# Patient Record
Sex: Male | Born: 1935 | Race: Black or African American | Hispanic: No | Marital: Married | Smoking: Never smoker
Health system: Southern US, Community
[De-identification: ages and names within clinical notes are randomized; demographics above are authoritative.]

## PROBLEM LIST (undated history)

## (undated) DIAGNOSIS — E119 Type 2 diabetes mellitus without complications: Secondary | ICD-10-CM

## (undated) DIAGNOSIS — C801 Malignant (primary) neoplasm, unspecified: Secondary | ICD-10-CM

## (undated) DIAGNOSIS — E1121 Type 2 diabetes mellitus with diabetic nephropathy: Secondary | ICD-10-CM

## (undated) DIAGNOSIS — R310 Gross hematuria: Secondary | ICD-10-CM

## (undated) DIAGNOSIS — K219 Gastro-esophageal reflux disease without esophagitis: Secondary | ICD-10-CM

## (undated) DIAGNOSIS — I129 Hypertensive chronic kidney disease with stage 1 through stage 4 chronic kidney disease, or unspecified chronic kidney disease: Secondary | ICD-10-CM

## (undated) DIAGNOSIS — J449 Chronic obstructive pulmonary disease, unspecified: Secondary | ICD-10-CM

## (undated) DIAGNOSIS — C61 Malignant neoplasm of prostate: Principal | ICD-10-CM

## (undated) DIAGNOSIS — I10 Essential (primary) hypertension: Secondary | ICD-10-CM

## (undated) DIAGNOSIS — N183 Chronic kidney disease, stage 3 unspecified: Secondary | ICD-10-CM

## (undated) DIAGNOSIS — N08 Glomerular disorders in diseases classified elsewhere: Secondary | ICD-10-CM

## (undated) DIAGNOSIS — H409 Unspecified glaucoma: Secondary | ICD-10-CM

## (undated) DIAGNOSIS — N19 Unspecified kidney failure: Secondary | ICD-10-CM

## (undated) DIAGNOSIS — R42 Dizziness and giddiness: Secondary | ICD-10-CM

## (undated) HISTORY — DX: Hypertensive chronic kidney disease with stage 1 through stage 4 chronic kidney disease, or unspecified chronic kidney disease: I12.9

## (undated) HISTORY — DX: Unspecified glaucoma: H40.9

## (undated) HISTORY — DX: Dizziness and giddiness: R42

## (undated) HISTORY — PX: EYE SURGERY: SHX253

## (undated) HISTORY — DX: Unspecified kidney failure: N19

## (undated) HISTORY — DX: Type 2 diabetes mellitus with diabetic nephropathy: E11.21

## (undated) HISTORY — PX: PROSTATE SURGERY: SHX751

## (undated) HISTORY — DX: Malignant neoplasm of prostate: C61

## (undated) HISTORY — DX: Chronic obstructive pulmonary disease, unspecified: J44.9

## (undated) HISTORY — DX: Glomerular disorders in diseases classified elsewhere: N08

## (undated) HISTORY — DX: Gross hematuria: R31.0

## (undated) HISTORY — DX: Type 2 diabetes mellitus without complications: E11.9

## (undated) HISTORY — DX: Chronic kidney disease, stage 3 unspecified: N18.30

## (undated) HISTORY — DX: Gastro-esophageal reflux disease without esophagitis: K21.9

---

## 2004-06-12 ENCOUNTER — Other Ambulatory Visit: Admission: RE | Admit: 2004-06-12 | Discharge: 2004-06-12 | Payer: Self-pay | Admitting: Urology

## 2004-07-19 ENCOUNTER — Inpatient Hospital Stay (HOSPITAL_COMMUNITY): Admission: RE | Admit: 2004-07-19 | Discharge: 2004-07-25 | Payer: Self-pay | Admitting: Urology

## 2005-01-14 ENCOUNTER — Emergency Department (HOSPITAL_COMMUNITY): Admission: EM | Admit: 2005-01-14 | Discharge: 2005-01-14 | Payer: Self-pay | Admitting: Emergency Medicine

## 2008-08-03 ENCOUNTER — Encounter (INDEPENDENT_AMBULATORY_CARE_PROVIDER_SITE_OTHER): Payer: Self-pay | Admitting: *Deleted

## 2008-08-30 ENCOUNTER — Ambulatory Visit: Payer: Self-pay | Admitting: Gastroenterology

## 2008-08-30 ENCOUNTER — Ambulatory Visit (HOSPITAL_COMMUNITY): Admission: RE | Admit: 2008-08-30 | Discharge: 2008-08-30 | Payer: Self-pay | Admitting: Gastroenterology

## 2008-08-30 DIAGNOSIS — R079 Chest pain, unspecified: Secondary | ICD-10-CM | POA: Insufficient documentation

## 2008-08-30 DIAGNOSIS — K3189 Other diseases of stomach and duodenum: Secondary | ICD-10-CM | POA: Insufficient documentation

## 2008-08-30 DIAGNOSIS — R1013 Epigastric pain: Secondary | ICD-10-CM

## 2008-09-01 ENCOUNTER — Encounter: Payer: Self-pay | Admitting: Gastroenterology

## 2008-09-08 ENCOUNTER — Ambulatory Visit: Payer: Self-pay | Admitting: Gastroenterology

## 2008-09-08 ENCOUNTER — Ambulatory Visit (HOSPITAL_COMMUNITY): Admission: RE | Admit: 2008-09-08 | Discharge: 2008-09-08 | Payer: Self-pay | Admitting: Gastroenterology

## 2008-09-08 ENCOUNTER — Encounter: Payer: Self-pay | Admitting: Gastroenterology

## 2008-09-13 ENCOUNTER — Telehealth (INDEPENDENT_AMBULATORY_CARE_PROVIDER_SITE_OTHER): Payer: Self-pay

## 2008-09-29 ENCOUNTER — Encounter: Payer: Self-pay | Admitting: Gastroenterology

## 2008-10-13 ENCOUNTER — Ambulatory Visit (HOSPITAL_COMMUNITY): Admission: RE | Admit: 2008-10-13 | Discharge: 2008-10-13 | Payer: Self-pay | Admitting: Gastroenterology

## 2008-10-13 ENCOUNTER — Encounter: Payer: Self-pay | Admitting: Gastroenterology

## 2008-10-19 ENCOUNTER — Encounter: Payer: Self-pay | Admitting: Gastroenterology

## 2008-10-21 ENCOUNTER — Encounter (INDEPENDENT_AMBULATORY_CARE_PROVIDER_SITE_OTHER): Payer: Self-pay | Admitting: *Deleted

## 2008-10-24 ENCOUNTER — Encounter: Payer: Self-pay | Admitting: Gastroenterology

## 2008-10-25 ENCOUNTER — Encounter (HOSPITAL_COMMUNITY): Admission: RE | Admit: 2008-10-25 | Discharge: 2008-11-24 | Payer: Self-pay | Admitting: Gastroenterology

## 2008-10-28 ENCOUNTER — Ambulatory Visit (HOSPITAL_COMMUNITY): Payer: Self-pay | Admitting: Oncology

## 2008-11-10 ENCOUNTER — Encounter: Payer: Self-pay | Admitting: Gastroenterology

## 2008-11-23 ENCOUNTER — Ambulatory Visit: Payer: Self-pay | Admitting: Gastroenterology

## 2009-03-14 ENCOUNTER — Ambulatory Visit (HOSPITAL_COMMUNITY): Payer: Self-pay | Admitting: Oncology

## 2009-03-14 ENCOUNTER — Encounter (HOSPITAL_COMMUNITY): Admission: RE | Admit: 2009-03-14 | Discharge: 2009-04-13 | Payer: Self-pay | Admitting: Oncology

## 2009-05-18 ENCOUNTER — Encounter: Payer: Self-pay | Admitting: Gastroenterology

## 2010-02-22 ENCOUNTER — Ambulatory Visit (HOSPITAL_COMMUNITY): Admission: RE | Admit: 2010-02-22 | Discharge: 2010-02-22 | Payer: Self-pay | Admitting: Pulmonary Disease

## 2010-03-14 ENCOUNTER — Ambulatory Visit (HOSPITAL_COMMUNITY): Payer: Self-pay | Admitting: Oncology

## 2010-03-14 ENCOUNTER — Encounter (HOSPITAL_COMMUNITY)
Admission: RE | Admit: 2010-03-14 | Discharge: 2010-04-13 | Payer: Self-pay | Source: Home / Self Care | Attending: Oncology | Admitting: Oncology

## 2010-03-29 ENCOUNTER — Encounter: Payer: Self-pay | Admitting: Gastroenterology

## 2010-05-29 NOTE — Letter (Signed)
Summary: [facility]   Imported By: Hoy Morn 03/29/2010 09:39:52  _____________________________________________________________________  External Attachment:    Type:   Image     Comment:   External Document

## 2010-05-29 NOTE — Letter (Signed)
Summary: [facility]   Imported By: Zeb Comfort 05/18/2009 09:29:18  _____________________________________________________________________  External Attachment:    Type:   Image     Comment:   External Document  Appended Document: [facility] NOV 2010-->opv 1 YEAR

## 2010-06-13 ENCOUNTER — Other Ambulatory Visit (HOSPITAL_COMMUNITY): Payer: Self-pay | Admitting: Oncology

## 2010-06-13 ENCOUNTER — Other Ambulatory Visit (HOSPITAL_COMMUNITY): Payer: Medicare Other

## 2010-06-13 ENCOUNTER — Encounter (HOSPITAL_COMMUNITY): Payer: Medicare Other | Attending: Oncology

## 2010-06-13 DIAGNOSIS — I10 Essential (primary) hypertension: Secondary | ICD-10-CM | POA: Insufficient documentation

## 2010-06-13 DIAGNOSIS — R972 Elevated prostate specific antigen [PSA]: Secondary | ICD-10-CM | POA: Insufficient documentation

## 2010-06-13 DIAGNOSIS — M948X9 Other specified disorders of cartilage, unspecified sites: Secondary | ICD-10-CM | POA: Insufficient documentation

## 2010-06-13 DIAGNOSIS — N289 Disorder of kidney and ureter, unspecified: Secondary | ICD-10-CM | POA: Insufficient documentation

## 2010-06-13 DIAGNOSIS — C61 Malignant neoplasm of prostate: Secondary | ICD-10-CM

## 2010-06-13 DIAGNOSIS — Z8546 Personal history of malignant neoplasm of prostate: Secondary | ICD-10-CM | POA: Insufficient documentation

## 2010-06-15 ENCOUNTER — Other Ambulatory Visit (HOSPITAL_COMMUNITY): Payer: Self-pay | Admitting: Oncology

## 2010-06-15 ENCOUNTER — Ambulatory Visit (HOSPITAL_COMMUNITY): Payer: Self-pay | Admitting: *Deleted

## 2010-06-15 ENCOUNTER — Ambulatory Visit (HOSPITAL_COMMUNITY): Payer: Medicare Other | Admitting: Oncology

## 2010-06-15 DIAGNOSIS — C61 Malignant neoplasm of prostate: Secondary | ICD-10-CM

## 2010-06-18 ENCOUNTER — Other Ambulatory Visit (HOSPITAL_COMMUNITY): Payer: Self-pay | Admitting: Oncology

## 2010-06-18 DIAGNOSIS — C61 Malignant neoplasm of prostate: Secondary | ICD-10-CM

## 2010-06-19 ENCOUNTER — Ambulatory Visit (HOSPITAL_COMMUNITY): Payer: Medicare Other

## 2010-06-19 ENCOUNTER — Ambulatory Visit (HOSPITAL_COMMUNITY)
Admission: RE | Admit: 2010-06-19 | Discharge: 2010-06-19 | Disposition: A | Discharge status: Home / Self Care | Payer: Medicare Other | Source: Ambulatory Visit | Attending: Oncology | Admitting: Oncology

## 2010-06-19 ENCOUNTER — Encounter (HOSPITAL_COMMUNITY): Payer: Self-pay

## 2010-06-19 DIAGNOSIS — Z8546 Personal history of malignant neoplasm of prostate: Secondary | ICD-10-CM | POA: Insufficient documentation

## 2010-06-19 DIAGNOSIS — R937 Abnormal findings on diagnostic imaging of other parts of musculoskeletal system: Secondary | ICD-10-CM | POA: Insufficient documentation

## 2010-06-19 DIAGNOSIS — C61 Malignant neoplasm of prostate: Secondary | ICD-10-CM

## 2010-06-19 DIAGNOSIS — Z9079 Acquired absence of other genital organ(s): Secondary | ICD-10-CM | POA: Insufficient documentation

## 2010-06-19 DIAGNOSIS — Q619 Cystic kidney disease, unspecified: Secondary | ICD-10-CM | POA: Insufficient documentation

## 2010-06-19 HISTORY — DX: Essential (primary) hypertension: I10

## 2010-06-19 HISTORY — DX: Malignant (primary) neoplasm, unspecified: C80.1

## 2010-06-19 MED ORDER — TECHNETIUM TC 99M MEDRONATE IV KIT
25.0000 | PACK | Freq: Once | INTRAVENOUS | Status: AC | PRN
  Administered 2010-06-19: 25.3 via INTRAVENOUS

## 2010-06-26 ENCOUNTER — Ambulatory Visit (HOSPITAL_COMMUNITY): Payer: Medicare Other | Admitting: Oncology

## 2010-06-26 DIAGNOSIS — C61 Malignant neoplasm of prostate: Secondary | ICD-10-CM

## 2010-06-29 LAB — DIFFERENTIAL
Basophils Relative: 1 % (ref 0–1)
Lymphs Abs: 1.6 10*3/uL (ref 0.7–4.0)
Monocytes Relative: 8 % (ref 3–12)
Neutro Abs: 3.3 10*3/uL (ref 1.7–7.7)
Neutrophils Relative %: 60 % (ref 43–77)

## 2010-06-29 LAB — COMPREHENSIVE METABOLIC PANEL
BUN: 20 mg/dL (ref 6–23)
Calcium: 9.3 mg/dL (ref 8.4–10.5)
Glucose, Bld: 139 mg/dL — ABNORMAL HIGH (ref 70–99)
Total Protein: 6.9 g/dL (ref 6.0–8.3)

## 2010-06-29 LAB — CBC
HCT: 35.7 % — ABNORMAL LOW (ref 39.0–52.0)
MCHC: 35.3 g/dL (ref 30.0–36.0)
MCV: 77.4 fL — ABNORMAL LOW (ref 78.0–100.0)
RDW: 13 % (ref 11.5–15.5)

## 2010-07-03 ENCOUNTER — Ambulatory Visit: Payer: Medicare Other | Attending: Radiation Oncology | Admitting: Radiation Oncology

## 2010-07-03 DIAGNOSIS — Z510 Encounter for antineoplastic radiation therapy: Secondary | ICD-10-CM | POA: Insufficient documentation

## 2010-07-03 DIAGNOSIS — C61 Malignant neoplasm of prostate: Secondary | ICD-10-CM | POA: Insufficient documentation

## 2010-07-10 LAB — PSA: PSA: 0.32 ng/mL (ref <=4.00)

## 2010-08-01 LAB — PROTEIN ELECTROPH W RFLX QUANT IMMUNOGLOBULINS
Beta 2: 4.8 % (ref 3.2–6.5)
Beta Globulin: 4.9 % (ref 4.7–7.2)
Gamma Globulin: 18.2 % (ref 11.1–18.8)
M-Spike, %: NOT DETECTED g/dL

## 2010-08-01 LAB — DIFFERENTIAL
Basophils Absolute: 0 10*3/uL (ref 0.0–0.1)
Basophils Relative: 1 % (ref 0–1)
Eosinophils Absolute: 0 10*3/uL (ref 0.0–0.7)
Eosinophils Relative: 1 % (ref 0–5)
Monocytes Absolute: 0.2 10*3/uL (ref 0.1–1.0)
Neutro Abs: 3.1 10*3/uL (ref 1.7–7.7)

## 2010-08-01 LAB — COMPREHENSIVE METABOLIC PANEL
ALT: 53 U/L (ref 0–53)
AST: 39 U/L — ABNORMAL HIGH (ref 0–37)
Albumin: 4.2 g/dL (ref 3.5–5.2)
Alkaline Phosphatase: 49 U/L (ref 39–117)
BUN: 13 mg/dL (ref 6–23)
Chloride: 101 mEq/L (ref 96–112)
Potassium: 3.9 mEq/L (ref 3.5–5.1)
Total Bilirubin: 0.8 mg/dL (ref 0.3–1.2)

## 2010-08-01 LAB — CBC
HCT: 39 % (ref 39.0–52.0)
Platelets: 186 10*3/uL (ref 150–400)
WBC: 4.6 10*3/uL (ref 4.0–10.5)

## 2010-08-05 LAB — CBC
Hemoglobin: 13.6 g/dL (ref 13.0–17.0)
MCHC: 36.3 g/dL — ABNORMAL HIGH (ref 30.0–36.0)
MCV: 81.9 fL (ref 78.0–100.0)
RBC: 4.58 MIL/uL (ref 4.22–5.81)
WBC: 6.1 10*3/uL (ref 4.0–10.5)

## 2010-08-05 LAB — PSA: PSA: 0.14 ng/mL (ref 0.10–4.00)

## 2010-09-11 NOTE — Op Note (Signed)
NAME:  Richard Swanson, Richard Swanson            ACCOUNT NO.:  [id number]   MEDICAL RECORD NO.:  [id number]          PATIENT TYPE:  AMB   LOCATION:  DAY                           FACILITY:  [facility]   PHYSICIAN:  Caro Hight, M.D.      DATE OF BIRTH:  23-Nov-1934   DATE OF PROCEDURE:  09/08/2008  DATE OF DISCHARGE:                               OPERATIVE REPORT   REFERRING Laurena Valko:  Jasper Loser. Luan Pulling, MD   PROCEDURE:  Esophagogastroduodenoscopy with cold forceps biopsy.   INDICATION FOR EXAM:  Mr. Schey is a 76 year old male whose brother  had a gastric GIST.  He presents with new-onset dyspepsia.   FINDINGS:  1. Normal esophagus without evidence of Barrett mass, erosion,      ulceration, or strictures.  2. Patchy erythema with occasional erosion in the antrum.  Patchy      erythema in the body of the stomach.  Biopsies obtained via cold      forceps to evaluate for H. pylori gastritis.  3. Normal duodenal bulb and second portion of the duodenum.   DIAGNOSIS:  Mild gastritis.   RECOMMENDATIONS:  1. We will call Mr. Carrara with results of his biopsies.  2. He was given a handout on gastritis.  3. No aspirin, NSAIDs, or anticoagulation for 7 days.  4. He may resume his previous diet.  If no source for his symptoms are      identified, then he will need a CT scan of the abdomen and pelvis.   MEDICATIONS:  1. Demerol 50 mg IV.  2. Versed 2 mg IV.   PROCEDURE TECHNIQUE:  Physical exam was performed.  Informed consent was  obtained from the patient explaining benefits, risks, and alternatives  to procedure.  The patient was connected to the monitor and placed in  left lateral position.  Continuous oxygen was provided by nasal cannula  and IV medicine was administered through an indwelling cannula.  After  administration of sedation, the patient's esophagus was intubated.  The  scope was advanced under direct visualization to the second portion of  the duodenum.  The scope was removed slowly  by  carefully examining the color, texture, anatomy, and integrity mucosa on  the way out.  The patient was recovered in endoscopy and discharged home  in satisfactory condition.   PATH:  Mild gastritis      Caro Hight, M.D.  Electronically Signed     SM/MEDQ  D:  09/08/2008  T:  09/08/2008  Job:  [id number]   cc:   Percell Miller L. Luan Pulling, M.D.  Fax: [phone]

## 2010-09-12 ENCOUNTER — Other Ambulatory Visit (HOSPITAL_COMMUNITY): Payer: Medicare Other

## 2010-09-14 ENCOUNTER — Ambulatory Visit (HOSPITAL_COMMUNITY): Payer: Medicare Other | Admitting: Oncology

## 2010-09-14 NOTE — Group Therapy Note (Signed)
NAME:  Richard Swanson, Richard Swanson            ACCOUNT NO.:  [id number]   MEDICAL RECORD NO.:  [id number]          PATIENT TYPE:  INP   LOCATION:  IC10                          FACILITY:  [facility]   PHYSICIAN:  Edward L. Luan Pulling, M.D.DATE OF BIRTH:  Mar 16, 1935   DATE OF PROCEDURE:  07/20/2004  DATE OF DISCHARGE:                                   PROGRESS NOTE   PROBLEM:  Status post radical prostatectomy and lymph node dissection.   SUBJECTIVE:  Richard Swanson says he feels well and has no complaints.  He does  have some pain postoperatively which is to be expected of course.  He has  had no problems through the night.   PHYSICAL EXAMINATION:  GENERAL:  He is awake and alert.  CHEST:  Very clear.  HEART:  Regular.  VITAL SIGNS:  Blood pressure is about 120, pulse is around 80.  ABDOMEN:  Not examined.   LABORATORY DATA:  White count 14,100, hemoglobin 12.3, and his BME shows a  sodium of 132, potassium of 3.6, glucose 137, BUN of 8.   ASSESSMENT:  He is doing well.   PLAN:  Continue treatments and follow.      ELH/MEDQ  D:  07/20/2004  T:  07/20/2004  Job:  [id number]

## 2010-09-14 NOTE — Op Note (Signed)
NAME:  AARAV, GUIDA            ACCOUNT NO.:  [id number]   MEDICAL RECORD NO.:  [id number]          PATIENT TYPE:  AMB   LOCATION:  DAY                           FACILITY:  [facility]   PHYSICIAN:  Marissa Nestle, M.D.DATE OF BIRTH:  1934/10/10   DATE OF PROCEDURE:  07/19/2004  DATE OF DISCHARGE:                                 OPERATIVE REPORT   PREOPERATIVE DIAGNOSIS:  Carcinoma of prostate.   POSTOPERATIVE DIAGNOSIS:  Carcinoma of prostate.   PROCEDURE:  Bilateral pelvic node dissection, radical retropubic  prostatectomy.   ANESTHESIA:  General endotracheal.   SURGEON:  Marissa Nestle, M.D.   ESTIMATED BLOOD LOSS:  1200 mL.   BLOOD REPLACED:  Two units of packed cells.   COMPLICATIONS:  None.   COUNTS:  Instrument, needle, sponge count correct.   PROCEDURE:  The patient was given general endotracheal anesthesia, placed in  low lithotomy position.  A midline suprapubic incision about three inches  long is made, carried down through the subcutaneous tissue, rectus sheath is  incised, recti separated in the midline, retropubic space was entered.  The  dissection is done in the paravesical areas to expose the right and left  external iliac vein.  Self-retaining Bookwalter retractor is applied, so I  proceed to do the node dissection on the right side.  Fascia around the  external iliac vein on the left side is opened.  Obturator nerve and vessels  are readily identified.  Between these vessels and external iliac vein, the  fatty tissue covering the Scarpa's fascia was removed.  The lymphatics were  clipped, specimen sent for frozen section.  Similarly, specimen from the  right side is taken and the report came back that there is no metastatic  disease in the lymph nodes on both sides, so I proceeded to do a radical  prostatectomy.  The fat covering the anterior surface of the prostate is  cleared.  The puboprostatic ligaments, some of them which were visible, they  were divided close to the prostate and the endopelvic fascia is opened near  the apex of the prostate, and the opening in the fascia was carried toward  the superior part of the prostate alongside the lateral side of the prostate  on both sides, and the neurovascular bundles were then pushed away from the  prostate, clipping the small branches going into the prostate.  The apex of  the prostate is exposed.  The dorsal vein complex was ligated with a couple  of stitches of 0 Vicryl and then one stitch was placed on the anterior  surface of the prostate.  In between these two stitches, the dorsal vein  complex is divided, the urethra is exposed.  The anterior wall of the  urethra is divided.  The Foley catheter pulled out with a Vanderbilt clamp,  the proximal end was clamped, and catheter divided.  Slight traction on the  catheter, the posterior wall of the urethra was divided.  The neurovascular  bundles are pushed away from the apex of the prostate.  The prostate was  further lifted up and Denonvilliers' fascia was opened.  Seminal vesicles  were exposed.  This side of the seminal vesicle, they were completely freed.  I proceeded to do the dissection on the bladder neck.  It was opened  anteriorly in the midline.  The catheter was pulled out from the bladder and  held both ends together for traction on the prostate.  The ureteral orifices  were identified.  Whistle-tip #5 catheters were passed up into the renal  pelvis on both sides, then catheters stabilized to the bladder with 4-0  chromic stitch.  Posterior bladder neck is then divided under vision.  The  prostate is separated from the bladder.  From this side the vasa deferentia  were identified.  They were divided and clipped.  The remaining part of the  sheath covering the seminal vesicle was divided.  The seminal vesicles were  pulled out of their sheath, clipping the seminal vesicle arteries.  The  remaining attachments were  divided.  Specimen came out.  Operative site was  completely inspected.  The bleeders were ligated, closed with 3-0 Vicryl  stitch.  The patient lost about 1200 mL of blood, replaced with 2 units.  Operative site was thoroughly irrigated.  The bladder neck is closed with 2-  0 Vicryl stitch on each side, and the bladder neck was then epithelialized  by creating a stitch of 4-0 chromic, everting the mucosa covering the  bladder neck.  The stitch on the side of the bladder neck on each side was  left to be tied later with the urethral stitch.  Operative site was  thoroughly inspected, no bleeding.  All the laps were removed.  A #20 Foley  catheter inserted into urethra and the Foley catheter was held with a free  tie.  Six stitches between the urethral stump and the bladder neck were  placed using 3-0 Vicryl.  Then the Foley catheter was placed in the bladder,  balloon inflated to 40 mL, held on traction by moving the superior blade of  the retractor.  The bladder was pushed into the retropubic space to  approximate the urethral stump and the bladder neck.  All the stitches were  closed.  The superior stitch on each side was also closed with dorsal vein  complex stitches.  The retropubic space was drained with a __________ sump.  The ureteral catheter came out through a separate stab wound in the bladder  that came out along the __________ sump, stabilized to the skin with 0 silk  stitch.  The bladder is irrigated, no leak from the anastomosis.  Foley  catheter was left on traction.  The rectus sheath closed with continuous  stitch of 0 Vicryl, skin was closed with staples, sterile gauze dressing  applied.  The patient left the operating room in satisfactory condition.      MIJ/MEDQ  D:  07/19/2004  T:  07/19/2004  Job:  [id number]

## 2010-09-14 NOTE — Group Therapy Note (Signed)
NAME:  Richard Swanson, Richard Swanson            ACCOUNT NO.:  [id number]   MEDICAL RECORD NO.:  [id number]          PATIENT TYPE:  INP   LOCATION:  IC10                          FACILITY:  [facility]   PHYSICIAN:  Edward L. Luan Pulling, M.D.DATE OF BIRTH:  [birth year]   DATE OF PROCEDURE:  DATE OF DISCHARGE:                                   PROGRESS NOTE   PROBLEM:  1.  Status post prostatectomy and lymph node dissection.  2.  Hypertension.   SUBJECTIVE:  Richard Swanson says he feels great and has no complaints.  He has  had some of the drains removed and apparently is set to move from the  intensive care unit today.  He was able to sleep last night with the use of  Ativan.   PHYSICAL EXAMINATION:  CHEST:  Clear.  HEART:  Regular.  ABDOMEN:  Soft.  VITAL SIGNS:  Blood pressure 120/60, pulse is 80.  He does have some fever,  but his chest is very clear and his heart is regular.   ASSESSMENT:  He is much improved.   PLAN:  Apparently to move him from the intensive care unit.  Certainly, I  agree with that plan and would follow.      ELH/MEDQ  D:  07/22/2004  T:  07/22/2004  Job:  [id number]

## 2010-09-14 NOTE — Discharge Summary (Signed)
NAME:  Richard Swanson, Richard Swanson            ACCOUNT NO.:  [id number]   MEDICAL RECORD NO.:  [id number]          PATIENT TYPE:  INP   LOCATION:  A207                          FACILITY:  [facility]   PHYSICIAN:  Marissa Nestle, M.D.DATE OF BIRTH:  1934/08/16   DATE OF ADMISSION:  07/19/2004  DATE OF DISCHARGE:  03/29/2006LH                                 DISCHARGE SUMMARY   HISTORY OF PRESENT ILLNESS:  This 76 year old gentleman has been followed by  me since December.  She PSA is 6.73, and because of his age, we decided to  do a prostate biopsy, and it showed that he has adenocarcinoma of the  prostate with a Gleason score of 3.  Three biopsies out of 12 were positive.  We discussed various treatment options.  He elected to undergo a radical  prostatectomy.   HOSPITAL COURSE:  Preoperative work-up was done.  CBC, urinalysis, EKG, and  chest x-ray were normal.  He was taken to the operating room, and underwent  bilateral pelvic node dissection and radical retropubic prostatectomy.   The first postoperative day, his temperature was 100.1.  He was encouraged  to do his incentive spirometry.  CBC count is 14.1, hematocrit 34.5.  Stump  is dry.  Abdomen was soft and nondistended.  He was placed out of bed.  His  glucose was 137, BUN 8, creatinine 1.3.   On July 22, 2004, urine status was good.  Temperature was 101.3.  Urine  output was satisfactory, abdomen was soft, good bowel sounds no flatus.  Chest x-ray was negative.  CBC was normal.  I discontinued his Ancef and put  him on Cipro 400 mg IV b.i.d.   The next day he was afebrile, doing well.  He had a bowel movement.  Intake  and output are satisfactory.  Urine is pinkish.  Discontinued his IV and put  him a regular diet.   On July 24, 2004, he was afebrile and felt fine.  Hematocrit has dropped to  23.2.  Stool guaiac was done, which was negative.  He was given a couple of  units of blood.  At this point, he was up and walking around,  eating regular  food.  Hematocrit had come up to 31.7, WBC count 8000.  Sodium 133,  potassium, chloride 100, CO2 of 30, BUN 8, creatinine 1.4.  His final  pathology report is back.  His DNM code is [place].   At this point I am going to discharge him home.  Will take the stitches out  next week in the office.   FINAL DISCHARGE DIAGNOSIS:  Carcinoma of the prostate.   DISCHARGE CONDITION:  Improved.   DISCHARGE MEDICATIONS:  1.  Percocet.  2.  Cipro 250 mg one b.i.d. for 5 days.  3.  Detrol LA one p.o. daily, #7.   FOLLOW UP:  I will see him back in the office next week for removal of the  stitches.  He is going home with a Foley catheter.      MIJ/MEDQ  D:  07/29/2004  T:  07/30/2004  Job:  [id number]

## 2010-09-14 NOTE — Consult Note (Signed)
NAME:  Richard, Swanson            ACCOUNT NO.:  [id number]   MEDICAL RECORD NO.:  [id number]          PATIENT TYPE:  INP   LOCATION:  IC10                          FACILITY:  [facility]   PHYSICIAN:  Edward L. Luan Pulling, M.D.DATE OF BIRTH:  October 23, 1934   DATE OF CONSULTATION:  07/19/2004  DATE OF DISCHARGE:                                   CONSULTATION   REASON FOR CONSULTATION:  Medical management postop.   HISTORY:  Richard Swanson is a 76 year old whom I have seen for many years  with hypertension.  He had had blood work done, which showed that he had an  elevated PSA.  He was referred to Dr. Michela Pitcher and who had a prostate biopsy  which showed an adenocarcinoma of the prostate.  He has undergone a radical  prostatectomy since then.   PAST MEDICAL HISTORY:  Positive for hypertension and essentially no other  medical problems.   FAMILY HISTORY:  Negative for prostate cancer.  It is positive for  hypertension.   SOCIAL HISTORY:  He is a Company secretary.  He does not smoke.  He does not drink  any alcohol.  He had also worked at a [employer].   REVIEW OF SYSTEMS:  Essentially otherwise negative.   PHYSICAL EXAMINATION:  GENERAL:  Physical exam now shows that he is postop  in the intensive care unit.  VITAL SIGNS:  His blood pressure now 105/71, pulse is about 80.  CHEST:  Very clear.  CARDIAC:  His heart is regular.  ABDOMEN:  Not examined, he has just had surgery.  EXTREMITIES:  He does not have any edema of the extremities.  HEENT:  Mucous membranes are moist.  Pupils are reactive.   ASSESSMENT:  He seems to have done very well and postoperatively is quite  stable.  I will plan to follow him with you.  At this point, of course, he  does not need any treatment for his blood pressure, but he will again.  Thus  far, things have gone well.      ELH/MEDQ  D:  07/19/2004  T:  07/20/2004  Job:  [id number]

## 2010-09-14 NOTE — Group Therapy Note (Signed)
NAME:  Richard Swanson, Richard Swanson            ACCOUNT NO.:  [id number]   MEDICAL RECORD NO.:  [id number]          PATIENT TYPE:  INP   LOCATION:  IC10                          FACILITY:  [facility]   PHYSICIAN:  Edward L. Luan Pulling, M.D.DATE OF BIRTH:  Oct 19, 1934   DATE OF PROCEDURE:  DATE OF DISCHARGE:                                   PROGRESS NOTE   Mr. Alkins says he is feeling well and has no complaints.  He has been  sitting up in a chair.  His exam shows that his chest is clear.  O2  saturation is 95% on room air.  Blood pressure is about 130/80, pulse is 80,  heart is regular.   ASSESSMENT:  He is improved.   PLAN:  Plan is to continue his treatments and follow.      ELH/MEDQ  D:  07/23/2004  T:  07/23/2004  Job:  [id number]

## 2010-09-14 NOTE — Group Therapy Note (Signed)
NAME:  Richard Swanson, Richard Swanson            ACCOUNT NO.:  [id number]   MEDICAL RECORD NO.:  [id number]          PATIENT TYPE:  INP   LOCATION:  IC10                          FACILITY:  [facility]   PHYSICIAN:  Edward L. Luan Pulling, M.D.DATE OF BIRTH:  09/23/1934   DATE OF PROCEDURE:  DATE OF DISCHARGE:                                   PROGRESS NOTE   SUBJECTIVE:  Richard Swanson says he is feeling well, and he has no particular  complaints except he cannot sleep.   OBJECTIVE:  VITAL SIGNS:  Temperature is 100, blood pressure 140/74, pulse  80.  CHEST:  Very clear.  HEART:  Regular.  ABDOMEN:  Soft.  He has a small amount of bowel sounds.   His white blood count is 13,400, hemoglobin 11.6, platelets 128,000.   ASSESSMENT:  Overall, things seem to be going well.   PLAN:  I am going to go ahead and given him some Ativan sublingually at  night to see if it will help him sleep.  He will probably have to start his  blood pressure medication back tomorrow.      ELH/MEDQ  D:  07/21/2004  T:  07/21/2004  Job:  [id number]

## 2010-09-14 NOTE — Consult Note (Signed)
NAME:  RENDALL, FUCILE NO.:  [id number]   MEDICAL RECORD NO.:  [id number]          PATIENT TYPE:  EMS   LOCATION:  ED                            FACILITY:  [facility]   PHYSICIAN:  J. Sanjuana Kava, M.D. DATE OF BIRTH:  04-Feb-1935   DATE OF CONSULTATION:  01/14/2005  DATE OF DISCHARGE:                                   CONSULTATION   CHIEF COMPLAINT:  I fell off a ladder and hit my arm.   HISTORY OF PRESENT ILLNESS:  The patient is a 76 year old male who fell off  the ladder and injured his left humerus.  Got a fracture mid shaft of the  humerus, essentially nondisplaced.  A closed injury.  No other injuries  occurred.  No head injury.  He has pain and tenderness.  Neurovascularly he  is intact.  Fractured mid shaft diaphysis left humerus.   PLAN:  I put him in a co-abduction splint.  Explained to him about using a  sling, sleeping semierect.  Prescription of Vicodin was given for pain.  I  will see him in the office tomorrow afternoon.  They should put him in a  fracture brace.  Any difficulty or problem at all, let me know.  Radial  nerve function is intact.           ______________________________  J. Sanjuana Kava, M.D.     JWK/MEDQ  D:  01/14/2005  T:  01/14/2005  Job:  [id number]

## 2010-09-14 NOTE — Group Therapy Note (Signed)
NAME:  Richard Swanson, Richard Swanson            ACCOUNT NO.:  [id number]   MEDICAL RECORD NO.:  [id number]          PATIENT TYPE:  INP   LOCATION:  A207                          FACILITY:  [facility]   PHYSICIAN:  Edward L. Luan Pulling, M.D.DATE OF BIRTH:  [birth year]   DATE OF PROCEDURE:  07/24/2004  DATE OF DISCHARGE:                                   PROGRESS NOTE   Patient of Dr. Silvano Swanson.   Richard Swanson is doing well and says he is going home today.  He has no new  complaints.   PHYSICAL EXAMINATION:  CHEST:  Quite clear.  VITAL SIGNS:  Temperature 99.1, pulse 87, respirations 18, blood pressure  154/80.   ASSESSMENT:  He seems to be doing better.   PLAN:  I think it is okay for him to go home and I have asked for him to  start back on his home blood pressure medication.      ELH/MEDQ  D:  07/24/2004  T:  07/24/2004  Job:  [id number]

## 2010-09-14 NOTE — H&P (Signed)
NAME:  Richard Swanson, Richard Swanson            ACCOUNT NO.:  [id number]   MEDICAL RECORD NO.:  [id number]          PATIENT TYPE:  AMB   LOCATION:  DAY                           FACILITY:  [facility]   PHYSICIAN:  Marissa Nestle, M.D.DATE OF BIRTH:  05/07/1934   DATE OF ADMISSION:  07/19/2004  DATE OF DISCHARGE:  LH                                HISTORY & PHYSICAL   CHIEF COMPLAINT:  Prostate cancer.   HISTORY:  This 76 year old gentleman was seen by me on April 09, 2004,  after he was referred by Dr. Luan Pulling.  His PSA was 6.73.  He has no symptoms  of prostatism.  I told him to repeat his PSA with a free PSA and the next  week his PSA was 5.92.  It dropped a little bit, but his free PSA was low at  14%.  Because of his age, I told him that he needs to have a prostate  biopsy, which was done and it showed that he has adenocarcinoma of the  prostate.  His Gleason score is 1 + 2 3.  Three biopsies out of 12 are  positive.  I discussed the treatment options, including radical  prostatectomy and radiotherapy and he elected to go ahead and have radical  prostatectomy.  That is what I recommended.  I had a long discussion with  the patient and his wife.  I told them the complications of radical  prostatectomy.  Especially since he has no other significant medical  problems, he should do well.  His complications including:  1.  Urinary  incontinence, which can be permanent.  2.  Erectile dysfunction, which will  be permanent.  3.  Use of blood.  They understand and want me to go ahead  and proceed with the surgery, so he is coming in as an outpatient and will  undergo radical retropubic prostatectomy and bilateral pelvic node  dissection tomorrow.   PAST MEDICAL HISTORY:  He has hypertension.  No diabetes.  Never had any  other surgery.   FAMILY HISTORY:  No history of prostate cancer.   PERSONAL HISTORY:  Does not smoke or drink.   REVIEW OF SYSTEMS:  Denies any chest pain, orthopnea, PND,  nausea or  vomiting.   PHYSICAL EXAMINATION:  VITAL SIGNS:  The blood pressure is 140/80 and the  temperature is normal.  CENTRAL NERVOUS SYSTEM:  He has no gross neurological deficits.  HEENT:  Negative.  NECK:  Supple.  No lymphadenopathy.  No bruit.  CHEST:  Normal breath sounds.  HEART:  Regular sinus rhythm.  ABDOMEN:  Soft and flat.  Liver, spleen and kidneys are not palpable.  No  CVA tenderness.  EXTERNAL GENITALIA:  Circumcised meatus, adequate.  The testicles are  normal.  RECTAL:  No rectal mass.  The prostate is 1+, smooth and firm.   IMPRESSION:  1.  Carcinoma of the prostate.  2.  Hypertension.   PLAN:  Radical retropubic prostatectomy and bilateral pelvic node  dissection.      MIJ/MEDQ  D:  07/18/2004  T:  07/18/2004  Job:  [id number]   cc:  [facility]  Fax: [place] Luan Pulling, M.D.  [place]  [place] [address]  Fax: [phone]

## 2010-10-17 ENCOUNTER — Ambulatory Visit
Admission: RE | Admit: 2010-10-17 | Discharge: 2010-10-17 | Disposition: A | Discharge status: Home / Self Care | Payer: Medicare Other | Source: Ambulatory Visit | Attending: Radiation Oncology | Admitting: Radiation Oncology

## 2010-11-02 ENCOUNTER — Other Ambulatory Visit (HOSPITAL_COMMUNITY): Payer: Self-pay | Admitting: Oncology

## 2010-11-02 ENCOUNTER — Encounter (HOSPITAL_COMMUNITY): Payer: Medicare Other | Attending: Oncology

## 2010-11-02 DIAGNOSIS — C61 Malignant neoplasm of prostate: Secondary | ICD-10-CM | POA: Insufficient documentation

## 2010-11-03 ENCOUNTER — Encounter (HOSPITAL_COMMUNITY): Payer: Self-pay | Admitting: Oncology

## 2010-11-03 ENCOUNTER — Encounter (HOSPITAL_COMMUNITY): Payer: Self-pay | Admitting: *Deleted

## 2010-11-03 ENCOUNTER — Other Ambulatory Visit (HOSPITAL_COMMUNITY): Payer: Self-pay | Admitting: Oncology

## 2010-11-03 DIAGNOSIS — C61 Malignant neoplasm of prostate: Secondary | ICD-10-CM

## 2010-11-03 HISTORY — DX: Malignant neoplasm of prostate: C61

## 2010-11-03 LAB — PSA: PSA: 0.09 ng/mL — ABNORMAL LOW (ref <=4.00)

## 2010-11-05 ENCOUNTER — Encounter (HOSPITAL_BASED_OUTPATIENT_CLINIC_OR_DEPARTMENT_OTHER): Payer: Medicare Other | Admitting: Oncology

## 2010-11-05 VITALS — BP 130/70 | HR 65 | Temp 97.7°F | Wt 197.8 lb

## 2010-11-05 DIAGNOSIS — C61 Malignant neoplasm of prostate: Secondary | ICD-10-CM

## 2010-11-05 NOTE — Progress Notes (Signed)
Richard Swanson NOTE  11/05/2010      DIAGNOSIS: History of a Gleason's score 5 prostate cancer, s/p radiation therapy by Dr. Valere Dross on 08/01/10- 09/14/10 and s/p radical prostatectomy in March 2006 by Dr. Michela Pitcher.   CURRENT THERAPY:  Following PSA via laboratory studies.   HPI: Richard Swanson is here today for follow-up of his Prostate cancer.  He recently completed curative radiation  To the prostate bed.  He underwent radiation from 08/01/10- 09/14/10.  According to Dr. Charlton Amor note, the patient complained of dysuria.  He tells me that has resolved.  He denies any urinary pain, burning, hematuria.  I will defer a U/A at this point in time.  The patient's most recent PSA was 0.09 compared to 0.37 on 06/13/10.  He is pleased with that result.  Again, he denies any complaint at this office visit.   REVIEW OF SYSTEMS:  Performance Status:Normal - ECOG 0   Constitutional: Normal - No Fever, chills, night sweats, or weight loss  Eyes: Normal - No change in visual acuity, blurred or double vision  Ears, Nose, Sinuses:Normal - No epistaxis, facial pain, nasal discharge or change in hearing  Mouth, Pharynx: Normal - No pain, ulceration, or sores noted  Cardiovascular:Normal - No chest pain, DOE, or palpitations  Respiratory: Normal - No shortness of breath, cough, hemoptysis, or pleuritic chest pain  Gastrointestinal:Normal - No abdominal pain, nausea, vomiting, diarrhea, rectal pain or bleeding  Genitourinary: Normal - Denies Hematuria or dysuria  Musculoskeletal: Normal - No bone pain  Skin: Normal - No skin rash or lesions noted  Neurologic: Normal - No numbness, weakness, neuropathic pain or change in cognitive function  Hematologic: Normal - No bleeding or lymph nodes noted   PHYSICAL EXAMINATION:  General Appearance: Normal - Healthy appearing patient in no acute distress  Skin: Normal - No skin rash, petechiae, ecchymosis noted  HEENT: Normal -  No oral or pharyngeal masses, ulceration or thrush noted, no sinus tenderness  Neck: Normal - Supple  Lungs/Thorax: Normal - Clear to auscultation and percussion  Heart: Normal - Regular rate and rhythm, normal S1, S2, no appreciable murmurs, rubs, gallops  Pulses: Normal - 2+ throughout and symmetrical  Abdomen:Normal - Soft, nontender, bowel sounds present, no appreciable hepatosplenomegaly, no palpable masses  Neurologic: Normal - Grossly intact   LABORATORY DATA: PSA on 11/02/10 0.09    ASSESSMENT: This is an African American 76 year old man with prostate cancer.  He is s/p prostatectomy in March 2006 and now most recently radiation by Dr. Valere Dross on 08/01/10- 09/14/10.  His PSA has dropped accordingly.   PLAN:  We will continue to follow the patient regularly.  We will obtain a CBC diff, CMET, and PSA in three months time.  The patient will return to the clinic for follow-up in three months as well.     All questions were answered. The patient knows to call the clinic with any problems, questions or concerns. We can certainly see the patient much sooner if necessary.  The patient and plan discussed with Everardo All, MD and he is in agreement with the aforementioned.  I spent 25 minutes counseling the patient face to face. The total time spent in the appointment was 40 minutes.  Sammuel Blick

## 2011-01-07 ENCOUNTER — Other Ambulatory Visit (HOSPITAL_COMMUNITY): Payer: Medicare Other

## 2011-01-11 ENCOUNTER — Encounter (HOSPITAL_COMMUNITY): Payer: Medicare Other | Attending: Oncology

## 2011-01-11 DIAGNOSIS — C61 Malignant neoplasm of prostate: Secondary | ICD-10-CM | POA: Insufficient documentation

## 2011-01-11 LAB — COMPREHENSIVE METABOLIC PANEL
ALT: 22 U/L (ref 0–53)
AST: 21 U/L (ref 0–37)
Albumin: 3.9 g/dL (ref 3.5–5.2)
Alkaline Phosphatase: 65 U/L (ref 39–117)
Potassium: 3.9 mEq/L (ref 3.5–5.1)
Sodium: 138 mEq/L (ref 135–145)
Total Protein: 8 g/dL (ref 6.0–8.3)

## 2011-01-11 LAB — DIFFERENTIAL
Basophils Absolute: 0 10*3/uL (ref 0.0–0.1)
Basophils Relative: 0 % (ref 0–1)
Eosinophils Absolute: 0.1 10*3/uL (ref 0.0–0.7)
Neutrophils Relative %: 76 % (ref 43–77)

## 2011-01-11 LAB — CBC
MCH: 27.5 pg (ref 26.0–34.0)
MCHC: 34.7 g/dL (ref 30.0–36.0)
Platelets: 154 10*3/uL (ref 150–400)

## 2011-01-11 LAB — PSA: PSA: 0.04 ng/mL — ABNORMAL LOW (ref <=4.00)

## 2011-01-11 NOTE — Progress Notes (Signed)
Labs drawn today for cbc/diff,cmp,psa

## 2011-02-11 ENCOUNTER — Encounter (HOSPITAL_COMMUNITY): Payer: Medicare Other | Attending: Oncology | Admitting: Oncology

## 2011-02-11 ENCOUNTER — Encounter (HOSPITAL_COMMUNITY): Payer: Medicare Other

## 2011-02-11 VITALS — BP 159/91 | HR 69 | Temp 98.0°F | Ht 72.0 in | Wt 196.0 lb

## 2011-02-11 DIAGNOSIS — C61 Malignant neoplasm of prostate: Secondary | ICD-10-CM

## 2011-02-11 DIAGNOSIS — N529 Male erectile dysfunction, unspecified: Secondary | ICD-10-CM

## 2011-02-11 NOTE — Patient Instructions (Signed)
[facility]  Discharge Instructions  RECOMMENDATIONS MADE BY THE CONSULTANT AND ANY TEST RESULTS WILL BE SENT TO YOUR REFERRING DOCTOR.   EXAM FINDINGS BY MD TODAY AND SIGNS AND SYMPTOMS TO REPORT TO CLINIC OR PRIMARY MD: Exam per [place] AND DISCUSSED: Other  Labs in 3 months  SPECIAL INSTRUCTIONS/FOLLOW-UP: Return to Clinic  In 3 months    I acknowledge that I have been informed and understand all the instructions given to me and received a copy. I do not have any more questions at this time, but understand that I may call the Specialty Clinic at [facility] at [phone] during business hours should I have any further questions or need assistance in obtaining follow-up care.    __________________________________________  _____________  __________ Signature of Patient or Authorized Representative            Date                   Time    __________________________________________ Nurse's Signature

## 2011-02-11 NOTE — Progress Notes (Addendum)
Richard Bogus, MD [address] [place] [address]  1. Prostate cancer  CBC, Differential, Comprehensive metabolic panel, PSA    CURRENT THERAPY:S/p radiation therapy by Dr. Valere Dross on 08/01/10- 09/14/10 and s/p radical prostatectomy in March 2006 by Dr. Michela Pitcher.   INTERVAL HISTORY: Richard Swanson 76 y.o. male returns for  regular  visit for followup of  History of a Gleason's score 5 prostate cancer.   The patient only complaint is morning is his sexual life. He reports that he does not want to take any erectile dysfunction medications. He is not wanting Viagra. Instead he uses a penile pump. He reports he has great results with this.  The patient denies any other complaints. He reports his bowels are operating appropriately. No urinary complaints.  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 He had a lot of activities scheduled for that weekend as result. 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  Past Medical History  Diagnosis Date  . Hypertension   . Cancer     Prostate  . Prostate cancer 11/03/2010    has DYSPEPSIA; CHEST PAIN, DULL; and Prostate cancer on his problem list.      has no known allergies.  Richard Swanson does not currently have medications on file.  No past surgical history on file.  Denies any headaches, dizziness, double vision, fevers, chills, night sweats, nausea, vomiting, diarrhea, constipation, chest pain, heart palpitations, shortness of breath, blood in stool, black tarry stool, urinary pain, urinary burning, urinary frequency, hematuria.   PHYSICAL EXAMINATION  ECOG PERFORMANCE STATUS: 0 - Asymptomatic  Filed Vitals:   02/11/11 1047  BP: 159/91  Pulse: 69  Temp: 98 F (36.7 C)    GENERAL:alert, no distress, well nourished, well developed, comfortable, cooperative and smiling SKIN: skin color, texture, turgor are  normal HEAD: Normocephalic EYES: normal EARS: External ears normal OROPHARYNX:mucous membranes are moist  NECK: supple, no adenopathy, no bruits, no JVD, thyroid normal size, non-tender, without nodularity, no stridor, non-tender, trachea midline LYMPH:  no palpable lymphadenopathy, no hepatosplenomegaly BREAST:not examined LUNGS: clear to auscultation and percussion HEART: regular rate & rhythm, no murmurs, no gallops, S1 normal and S2 normal ABDOMEN:abdomen soft, non-tender, normal bowel sounds, no masses or organomegaly and no hepatosplenomegaly BACK: Back symmetric, no curvature. EXTREMITIES:less then 2 second capillary refill, no joint deformities, effusion, or inflammation, no edema, no skin discoloration, no clubbing, no cyanosis  NEURO: alert & oriented x 3 with fluent speech, no focal motor/sensory deficits, gait normal   LABORATORY DATA: CBC    Component Value Date/Time   WBC 4.9 01/11/2011 1000   RBC 4.47 01/11/2011 1000   HGB 12.3* 01/11/2011 1000   HCT 35.4* 01/11/2011 1000   PLT 154 01/11/2011 1000   MCV 79.2 01/11/2011 1000   MCH 27.5 01/11/2011 1000   MCHC 34.7 01/11/2011 1000   RDW 13.0 01/11/2011 1000   LYMPHSABS 0.8 01/11/2011 1000   MONOABS 0.3 01/11/2011 1000   EOSABS 0.1 01/11/2011 1000   BASOSABS 0.0 01/11/2011 1000    Lab Results  Component Value Date   PSA 0.04* 01/11/2011   PSA 0.09* 11/02/2010   PSA 0.37 Test Methodology: Hybritech PSA 06/13/2010   PSA 0.37 Test Methodology: Hybritech PSA 06/13/2010     RADIOGRAPHIC STUDIES:  06/19/10  *RADIOLOGY REPORT*  Clinical Data: Prostate cancer  NUCLEAR MEDICINE WHOLE BODY BONE SCINTIGRAPHY  Technique: Whole body  anterior and posterior images were obtained  approximately 3 hours after intravenous injection of  radiopharmaceutical.  Radiopharmaceutical: 25.3 mCi technetium 86m MDP  Comparison: 10/25/2008  Findings: Activity within the mid diaphysis of the left humerus is  slightly more prominent. Arthritic  changes about the knee are  present. Heterogeneous uptake in the sternum is stable. Arthritic  changes about the shoulder. Cranial activity is unremarkable. The  appearance of the kidneys is unchanged. There is activity along  the course of the left ureter which is likely extra osseous.  IMPRESSION:  Activity in the left mid humerus diaphysis is slightly worse.  Metastatic disease is not excluded. The patient does have a  history of fracture 4 years ago. Plain films and possibly MRI may  be helpful.  Otherwise, no evidence of metastatic disease.  Original Report Authenticated By: Jamas Lav, M.D.         ASSESSMENT:  1. Prostate cancer 2. Erectile dysfunction, using a penile pump prior to intercourse   PLAN:  1. Lab work in 3 months: CBC diff, CMET, PSA 2. Return in 3 months for follow-up. 3. Since the patient declined medication such as Viagra, I suggested that maybe the patient investigate alternative options regarding his erectile dysfunction. I suggested that maybe the patient research some all natural supplemental tablets such as ginko and yohimbe.  I've asked him to consult his primary care physician first. 4. I personally reviewed and went over laboratory results with the patient. 5. I suggested that the patient undergo a bone scan for restaging.  He has declined this at this point in time.  We will again discuss this on his next office visit.  His last bone scan was performed in Feb 2012 and a metastatic bone lesion could not be ruled out in the left mid humerus.    All questions were answered. The patient knows to call the clinic with any problems, questions or concerns. We can certainly see the patient much sooner if necessary.  More than 50% of the time spent with the patient was utilized for counseling.   Richard Swanson

## 2011-05-10 ENCOUNTER — Encounter (HOSPITAL_COMMUNITY): Payer: Medicare Other | Attending: Oncology

## 2011-05-10 DIAGNOSIS — I10 Essential (primary) hypertension: Secondary | ICD-10-CM | POA: Insufficient documentation

## 2011-05-10 DIAGNOSIS — C61 Malignant neoplasm of prostate: Secondary | ICD-10-CM

## 2011-05-10 LAB — CBC
HCT: 35.3 % — ABNORMAL LOW (ref 39.0–52.0)
Hemoglobin: 12.5 g/dL — ABNORMAL LOW (ref 13.0–17.0)
MCH: 27.7 pg (ref 26.0–34.0)
MCHC: 35.4 g/dL (ref 30.0–36.0)

## 2011-05-10 LAB — COMPREHENSIVE METABOLIC PANEL
Albumin: 4.1 g/dL (ref 3.5–5.2)
BUN: 20 mg/dL (ref 6–23)
Chloride: 104 mEq/L (ref 96–112)
Creatinine, Ser: 1.9 mg/dL — ABNORMAL HIGH (ref 0.50–1.35)
GFR calc non Af Amer: 33 mL/min — ABNORMAL LOW (ref >90)
Total Bilirubin: 0.6 mg/dL (ref 0.3–1.2)

## 2011-05-10 LAB — DIFFERENTIAL
Basophils Relative: 0 % (ref 0–1)
Monocytes Absolute: 0.4 10*3/uL (ref 0.1–1.0)
Monocytes Relative: 9 % (ref 3–12)
Neutro Abs: 2.6 10*3/uL (ref 1.7–7.7)

## 2011-05-10 LAB — PSA: PSA: 0.01 ng/mL — ABNORMAL LOW (ref <=4.00)

## 2011-05-10 NOTE — Progress Notes (Signed)
Labs drawn today for cbc,diff,psa,cmp

## 2011-05-14 ENCOUNTER — Encounter (HOSPITAL_BASED_OUTPATIENT_CLINIC_OR_DEPARTMENT_OTHER): Payer: Medicare Other | Admitting: Oncology

## 2011-05-14 ENCOUNTER — Encounter (HOSPITAL_COMMUNITY): Payer: Medicare Other | Admitting: Oncology

## 2011-05-14 VITALS — BP 149/83 | HR 84 | Temp 98.8°F | Wt 196.4 lb

## 2011-05-14 DIAGNOSIS — C61 Malignant neoplasm of prostate: Secondary | ICD-10-CM

## 2011-05-14 DIAGNOSIS — N529 Male erectile dysfunction, unspecified: Secondary | ICD-10-CM

## 2011-05-14 NOTE — Progress Notes (Signed)
Richard Bogus, MD, MD [place] [address]  1. Prostate cancer     CURRENT THERAPY:S/p radiation therapy by Dr. Valere Dross on 08/01/10- 09/14/10 and s/p radical prostatectomy in March 2006 by Dr. Michela Pitcher.   INTERVAL HISTORY: Richard Swanson 77 y.o. male returns for  regular  visit for followup of a Gleason's score 5 prostate cancer.   The patient reports that he had a wonderful Holiday season.    I personally reviewed and went over laboratory results with the patient.  His PSA remains very stable.  His creatinine is stable, but elevated indicating renal insufficiency.  His Hgb is mildly low, but stable.   The patient denies any complaints other than a decreased sex life.  He reports that he does participate in intercourse, but not as frequently as he would like.  He is known to have erectile dysfunction for which he utilizes a pump.  He wonders if his testosterone is within normal limits.  We will check that in 3 months with his next lab appointment.   Oncologically, the patient denies any complaints.  Complete ROS questioning is negative.     Past Medical History  Diagnosis Date  . Hypertension   . Cancer     Prostate  . Prostate cancer 11/03/2010    has DYSPEPSIA; CHEST PAIN, DULL; and Prostate cancer on his problem list.      has no known allergies.  Richard Swanson does not currently have medications on file.  No past surgical history on file.  Denies any headaches, dizziness, double vision, fevers, chills, night sweats, nausea, vomiting, diarrhea, constipation, chest pain, heart palpitations, shortness of breath, blood in stool, black tarry stool, urinary pain, urinary burning, urinary frequency, hematuria.   PHYSICAL EXAMINATION  ECOG PERFORMANCE STATUS: 0 - Asymptomatic  Filed Vitals:   05/14/11 1330  BP: 149/83  Pulse: 84  Temp: 98.8 F (37.1 C)    GENERAL:alert, healthy, no distress, well nourished, well developed, comfortable,  cooperative and smiling SKIN: skin color, texture, turgor are normal, no rashes or significant lesions HEAD: Normocephalic EYES: normal EARS: External ears normal OROPHARYNX:mucous membranes are moist  NECK: supple, no adenopathy, no bruits, thyroid normal size, non-tender, without nodularity, no stridor, non-tender, trachea midline LYMPH:  no palpable lymphadenopathy, no hepatosplenomegaly BREAST:not examined LUNGS: clear to auscultation and percussion HEART: regular rate & rhythm, no murmurs, no gallops, S1 normal and S2 normal ABDOMEN:abdomen soft, non-tender, normal bowel sounds, no masses or organomegaly and no hepatosplenomegaly BACK: Back symmetric, no curvature., No CVA tenderness EXTREMITIES:less then 2 second capillary refill, no joint deformities, effusion, or inflammation, no edema, no skin discoloration, no clubbing, no cyanosis  NEURO: alert & oriented x 3 with fluent speech, no focal motor/sensory deficits, gait normal   LABORATORY DATA: CBC    Component Value Date/Time   WBC 4.0 05/10/2011 0905   RBC 4.52 05/10/2011 0905   HGB 12.5* 05/10/2011 0905   HCT 35.3* 05/10/2011 0905   PLT 179 05/10/2011 0905   MCV 78.1 05/10/2011 0905   MCH 27.7 05/10/2011 0905   MCHC 35.4 05/10/2011 0905   RDW 13.3 05/10/2011 0905   LYMPHSABS 1.0 05/10/2011 0905   MONOABS 0.4 05/10/2011 0905   EOSABS 0.1 05/10/2011 0905   BASOSABS 0.0 05/10/2011 0905      Chemistry      Component Value Date/Time   NA 139 05/10/2011 0905   K 3.9 05/10/2011 0905   CL 104 05/10/2011 0905   CO2 26 05/10/2011  0905   BUN 20 05/10/2011 0905   CREATININE 1.90* 05/10/2011 0905      Component Value Date/Time   CALCIUM 9.6 05/10/2011 0905   ALKPHOS 60 05/10/2011 0905   AST 21 05/10/2011 0905   ALT 21 05/10/2011 0905   BILITOT 0.6 05/10/2011 0905     Lab Results  Component Value Date   PSA 0.01* 05/10/2011   PSA 0.04* 01/11/2011   PSA 0.09* 11/02/2010      ASSESSMENT: 1. Prostate cancer  2. Erectile dysfunction,  using a penile pump prior to intercourse   PLAN:  1. CT of Abd/pelvis beginning of March 2. Bone scan beginning of March 3. Lab work in 3 months: CBC diff, CMET, PSA and testosterone. 4. I personally reviewed and went over laboratory results with the patient. 5. Recommended the patient call his primary care physician for physical. 6. Recommended the patient get in contact with Dr. Oneida Alar to verify when he should return for next screening colonoscopy.  I do not have the records available regarding his previous colonoscopy.  7. Return in 3 months for follow-up   Swanson questions were answered. The patient knows to call the clinic with any problems, questions or concerns. We can certainly see the patient much sooner if necessary.  The patient and plan discussed with Richard All, MD and he is in agreement with the aforementioned.  I spent 20 minutes counseling the patient face to face. The total time spent in the appointment was 30 minutes.  Richard Swanson

## 2011-05-14 NOTE — Patient Instructions (Signed)
[facility]  Discharge Instructions  RECOMMENDATIONS MADE BY THE CONSULTANT AND ANY TEST RESULTS WILL BE SENT TO YOUR REFERRING DOCTOR.   EXAM FINDINGS BY MD TODAY AND SIGNS AND SYMPTOMS TO REPORT TO CLINIC OR PRIMARY MD: We will schedule you for a CT scan and a Bone scan around the beginning of March. Return to clinic in 3 months for lab work and to see MD.    I acknowledge that I have been informed and understand all the instructions given to me and received a copy. I do not have any more questions at this time, but understand that I may call the Specialty Clinic at [facility] at [phone] during business hours should I have any further questions or need assistance in obtaining follow-up care.    __________________________________________  _____________  __________ Signature of Patient or Authorized Representative            Date                   Time    __________________________________________ Nurse's Signature

## 2011-06-28 ENCOUNTER — Encounter (HOSPITAL_COMMUNITY): Payer: Medicare Other | Attending: Oncology

## 2011-06-28 DIAGNOSIS — K838 Other specified diseases of biliary tract: Secondary | ICD-10-CM | POA: Insufficient documentation

## 2011-06-28 DIAGNOSIS — N529 Male erectile dysfunction, unspecified: Secondary | ICD-10-CM | POA: Insufficient documentation

## 2011-06-28 DIAGNOSIS — R079 Chest pain, unspecified: Secondary | ICD-10-CM | POA: Insufficient documentation

## 2011-06-28 DIAGNOSIS — C61 Malignant neoplasm of prostate: Secondary | ICD-10-CM | POA: Insufficient documentation

## 2011-06-28 DIAGNOSIS — I10 Essential (primary) hypertension: Secondary | ICD-10-CM | POA: Insufficient documentation

## 2011-07-01 ENCOUNTER — Encounter (HOSPITAL_COMMUNITY): Payer: Self-pay

## 2011-07-01 ENCOUNTER — Encounter (HOSPITAL_COMMUNITY)
Admission: RE | Admit: 2011-07-01 | Discharge: 2011-07-01 | Disposition: A | Discharge status: Home / Self Care | Payer: Medicare Other | Source: Ambulatory Visit | Attending: Oncology | Admitting: Oncology

## 2011-07-01 ENCOUNTER — Ambulatory Visit (HOSPITAL_COMMUNITY)
Admission: RE | Admit: 2011-07-01 | Discharge: 2011-07-01 | Disposition: A | Discharge status: Home / Self Care | Payer: Medicare Other | Source: Ambulatory Visit | Attending: Oncology | Admitting: Oncology

## 2011-07-01 DIAGNOSIS — Z9079 Acquired absence of other genital organ(s): Secondary | ICD-10-CM | POA: Insufficient documentation

## 2011-07-01 DIAGNOSIS — C61 Malignant neoplasm of prostate: Secondary | ICD-10-CM

## 2011-07-01 DIAGNOSIS — R9389 Abnormal findings on diagnostic imaging of other specified body structures: Secondary | ICD-10-CM | POA: Insufficient documentation

## 2011-07-01 MED ORDER — TECHNETIUM TC 99M MEDRONATE IV KIT
25.0000 | PACK | Freq: Once | INTRAVENOUS | Status: AC | PRN
  Administered 2011-07-01: 26 via INTRAVENOUS

## 2011-07-04 ENCOUNTER — Encounter (HOSPITAL_BASED_OUTPATIENT_CLINIC_OR_DEPARTMENT_OTHER): Payer: Medicare Other | Admitting: Oncology

## 2011-07-04 VITALS — BP 155/89 | HR 65 | Temp 98.4°F | Ht 72.0 in | Wt 195.0 lb

## 2011-07-04 DIAGNOSIS — N529 Male erectile dysfunction, unspecified: Secondary | ICD-10-CM

## 2011-07-04 DIAGNOSIS — C61 Malignant neoplasm of prostate: Secondary | ICD-10-CM

## 2011-07-04 LAB — COMPREHENSIVE METABOLIC PANEL
ALT: 21 U/L (ref 0–53)
AST: 21 U/L (ref 0–37)
CO2: 30 mEq/L (ref 19–32)
Chloride: 101 mEq/L (ref 96–112)
GFR calc non Af Amer: 36 mL/min — ABNORMAL LOW (ref >90)
Sodium: 139 mEq/L (ref 135–145)
Total Bilirubin: 0.5 mg/dL (ref 0.3–1.2)

## 2011-07-04 LAB — CBC
Platelets: 176 10*3/uL (ref 150–400)
RDW: 13.2 % (ref 11.5–15.5)
WBC: 5.2 10*3/uL (ref 4.0–10.5)

## 2011-07-04 LAB — DIFFERENTIAL
Basophils Absolute: 0 10*3/uL (ref 0.0–0.1)
Lymphocytes Relative: 17 % (ref 12–46)
Neutro Abs: 3.9 10*3/uL (ref 1.7–7.7)
Neutrophils Relative %: 76 % (ref 43–77)

## 2011-07-04 NOTE — Progress Notes (Signed)
Richard Bogus, MD, MD [address] [place] [address]  1. Prostate cancer  CBC, Differential, Comprehensive metabolic panel, PSA, CBC, Differential, Comprehensive metabolic panel, PSA, CBC, Differential, Comprehensive metabolic panel, PSA    CURRENT THERAPY:S/p radiation therapy by Dr. Valere Dross on 08/01/10- 09/14/10 and s/p radical prostatectomy in March 2006 by Dr. Michela Pitcher.   INTERVAL HISTORY: Richard Swanson 77 y.o. male returns for  regular  visit for followup of History of a Gleason's score 5 prostate cancer.   I personally reviewed and went over laboratory results with the patient.  His PSA remains stable at 0.01.  I personally reviewed and went over radiographic studies with the patient.  His bone scan was negative for any osseous disease.  His CT abd/pelvis did not reveal any recurrence or new/residula disease.  He was provided a copy of these two reports.   The patient otherwise is doing very well.  He denies any complaints.   ROS: No TIA's or unusual headaches, no dysphagia.  No prolonged cough. No dyspnea or chest pain on exertion.  No abdominal pain, change in bowel habits, black or bloody stools.  No urinary tract symptoms.  No new or unusual musculoskeletal symptoms.    Past Medical History  Diagnosis Date  . Hypertension   . Cancer     Prostate  . Prostate cancer 11/03/2010    dx 2006    has DYSPEPSIA; CHEST PAIN, DULL; and Prostate cancer on his problem list.      has no known allergies.  Richard Swanson does not currently have medications on file.  No past surgical history on file.  Denies any headaches, dizziness, double vision, fevers, chills, night sweats, nausea, vomiting, diarrhea, constipation, chest pain, heart palpitations, shortness of breath, blood in stool, black tarry stool, urinary pain, urinary burning, urinary frequency, hematuria.   PHYSICAL EXAMINATION  ECOG PERFORMANCE STATUS: 0 - Asymptomatic  Filed Vitals:   07/04/11 1001    BP: 155/89  Pulse: 65  Temp: 98.4 F (36.9 C)    GENERAL:alert, no distress, well nourished, well developed, comfortable, cooperative and smiling SKIN: skin color, texture, turgor are normal, no rashes or significant lesions HEAD: Normocephalic, No masses, lesions, tenderness or abnormalities EYES: normal EARS: External ears normal OROPHARYNX:mucous membranes are moist  NECK: supple, trachea midline LYMPH:  no palpable lymphadenopathy BREAST:not examined LUNGS: clear to auscultation and percussion HEART: regular rate & rhythm, no murmurs, no gallops, S1 normal and S2 normal ABDOMEN:abdomen soft, non-tender and normal bowel sounds BACK: Back symmetric, no curvature., No CVA tenderness EXTREMITIES:less then 2 second capillary refill, no joint deformities, effusion, or inflammation, no edema, no skin discoloration, no clubbing, no cyanosis  NEURO: alert & oriented x 3 with fluent speech, no focal motor/sensory deficits, gait normal    LABORATORY DATA: Lab Results  Component Value Date   PSA 0.01* 05/10/2011   PSA 0.04* 01/11/2011   PSA 0.09* 11/02/2010     PENDING LABS: CBC diff, CMET, PSA   RADIOGRAPHIC STUDIES:  07/01/2011  *RADIOLOGY REPORT*  Clinical Data: Prostate cancer surveillance. Fracture of left arm  for years ago.  NUCLEAR MEDICINE WHOLE BODY BONE SCINTIGRAPHY  Technique: Whole body anterior and posterior images were obtained  approximately 3 hours after intravenous injection of  radiopharmaceutical.  Radiopharmaceutical: 26MILLI CURIE TC-MDP TECHNETIUM TC 22M  MEDRONATE IV KIT  Comparison: 06/19/2010.  Findings: Increased uptake left humeral shaft unchanged. This may  be related to patient's prior history of trauma.  Areas of increased radiotracer  uptake consistent with degenerative  changes without focal area of radiotracer uptake suspicious for  osseous metastatic disease.  IMPRESSION:  No findings of osseous metastatic disease. Please see above   Original Report Authenticated By: Doug Sou, M.D.  07/01/2011  *RADIOLOGY REPORT*  Clinical Data: History of prostate cancer. Evaluate for recurrence  or metastatic disease.  CT ABDOMEN AND PELVIS WITHOUT CONTRAST  Technique: Multidetector CT imaging of the abdomen and pelvis was  performed following the standard protocol without intravenous  contrast.  Comparison: CT of abdomen and pelvis 06/19/2010.  Findings:  Lung Bases: Unremarkable.  Abdomen/Pelvis: The unenhanced appearance of the liver,  gallbladder, pancreas, spleen, bilateral adrenal glands and right  kidney is unremarkable. In the interpolar region of the left  kidney there is a 1.4 cm low attenuation lesion which is unchanged  in size compared to the prior exam from 06/19/2010, but is not  characterized on today's examination. Normal appendix. No ascites  or pneumoperitoneum and no pathologic distension of bowel. No  definite pathologic adenopathy noted within the abdomen or pelvis.  The patient is status post radical prostatectomy. Urinary bladder  is nearly completely decompressed, but is otherwise unremarkable in  appearance. Bilateral pelvic lymph node dissection is noted.  Musculoskeletal: There are no aggressive appearing lytic or blastic  lesions noted in the visualized portions of the skeleton.  IMPRESSION:  1. Status post radical prostatectomy without evidence to suggest  local recurrence of disease, or new metastatic disease within the  abdomen or pelvis on this noncontrast CT scan.  2. Unchanged 1.4 cm low attenuation lesion in the left kidney.  Although this is not technically characterized on today's  examination, stability in size over time would suggest a benign  lesion such as a cyst.  Original Report Authenticated By: Etheleen Mayhew, M.D.      ASSESSMENT:  1. Prostate cancer, S/P  radiation therapy by Dr. Valere Dross on 08/01/10- 09/14/10 and s/p radical prostatectomy in March 2006 by Dr.  Michela Pitcher.  2. Erectile dysfunction, using a penile pump prior to intercourse    PLAN:  1. I personally reviewed and went over radiographic studies with the patient. 2. I personally reviewed and went over laboratory results with the patient. 3. Lab work today: CBC diff, CMET 4. Lab work every 3 months: PSA 5. Lab work every 6 months: CBC diff, CMET 6. Return in 6 months for follow-up.   All questions were answered. The patient knows to call the clinic with any problems, questions or concerns. We can certainly see the patient much sooner if necessary.   Richard Swanson

## 2011-07-04 NOTE — Patient Instructions (Signed)
[facility]  Discharge Instructions  RECOMMENDATIONS MADE BY THE CONSULTANT AND ANY TEST RESULTS WILL BE SENT TO YOUR REFERRING DOCTOR.   EXAM FINDINGS BY MD TODAY AND SIGNS AND SYMPTOMS TO REPORT TO CLINIC OR PRIMARY MD: Exam good today  INSTRUCTIONS GIVEN AND DISCUSSED: We will check your PSA every 3 months  SPECIAL INSTRUCTIONS/FOLLOW-UP: Return to see [place] in 6 months   I acknowledge that I have been informed and understand all the instructions given to me and received a copy. I do not have any more questions at this time, but understand that I may call the Specialty Clinic at [facility] at [phone] during business hours should I have any further questions or need assistance in obtaining follow-up care.    __________________________________________  _____________  __________ Signature of Patient or Authorized Representative            Date                   Time    __________________________________________ Nurse's Signature

## 2011-07-04 NOTE — Progress Notes (Signed)
Richard Swanson presented for [employer]. Labs per MD order drawn via Peripheral Line 25 gauge needle inserted in rt ac.  Good blood return present. Procedure without incident.  Needle removed intact. Patient tolerated procedure well.

## 2011-10-01 ENCOUNTER — Other Ambulatory Visit (HOSPITAL_COMMUNITY): Payer: Self-pay

## 2011-10-04 ENCOUNTER — Other Ambulatory Visit (HOSPITAL_COMMUNITY): Payer: Medicare Other

## 2011-10-07 ENCOUNTER — Encounter (HOSPITAL_COMMUNITY): Payer: Medicare Other | Attending: Oncology

## 2011-10-07 DIAGNOSIS — C61 Malignant neoplasm of prostate: Secondary | ICD-10-CM | POA: Insufficient documentation

## 2011-10-07 LAB — COMPREHENSIVE METABOLIC PANEL
ALT: 30 U/L (ref 0–53)
Calcium: 10.2 mg/dL (ref 8.4–10.5)
Creatinine, Ser: 1.76 mg/dL — ABNORMAL HIGH (ref 0.50–1.35)
GFR calc Af Amer: 42 mL/min — ABNORMAL LOW (ref >90)
GFR calc non Af Amer: 36 mL/min — ABNORMAL LOW (ref >90)
Glucose, Bld: 286 mg/dL — ABNORMAL HIGH (ref 70–99)
Sodium: 136 mEq/L (ref 135–145)
Total Protein: 8.2 g/dL (ref 6.0–8.3)

## 2011-10-07 LAB — CBC
HCT: 37.2 % — ABNORMAL LOW (ref 39.0–52.0)
MCH: 27.9 pg (ref 26.0–34.0)
MCV: 78 fL (ref 78.0–100.0)
Platelets: 159 10*3/uL (ref 150–400)
RBC: 4.77 MIL/uL (ref 4.22–5.81)
RDW: 13.3 % (ref 11.5–15.5)

## 2011-10-07 LAB — DIFFERENTIAL
Eosinophils Absolute: 0.1 10*3/uL (ref 0.0–0.7)
Eosinophils Relative: 2 % (ref 0–5)
Lymphs Abs: 0.8 10*3/uL (ref 0.7–4.0)
Monocytes Absolute: 0.3 10*3/uL (ref 0.1–1.0)

## 2011-10-07 NOTE — Progress Notes (Signed)
Richard Swanson presented for [employer]. Labs per MD order drawn via Peripheral Line 24 gauge needle inserted in rt ac Good blood return present. Procedure without incident.  Needle removed intact. Patient tolerated procedure well.

## 2012-01-03 ENCOUNTER — Other Ambulatory Visit (HOSPITAL_COMMUNITY): Payer: Medicare Other

## 2012-01-06 ENCOUNTER — Other Ambulatory Visit (HOSPITAL_COMMUNITY): Payer: Medicare Other

## 2012-01-08 ENCOUNTER — Ambulatory Visit (HOSPITAL_COMMUNITY): Payer: Medicare Other | Admitting: Oncology

## 2012-01-14 ENCOUNTER — Encounter (HOSPITAL_COMMUNITY): Payer: Medicare Other | Attending: Oncology

## 2012-01-14 DIAGNOSIS — Z09 Encounter for follow-up examination after completed treatment for conditions other than malignant neoplasm: Secondary | ICD-10-CM | POA: Insufficient documentation

## 2012-01-14 DIAGNOSIS — R351 Nocturia: Secondary | ICD-10-CM | POA: Insufficient documentation

## 2012-01-14 DIAGNOSIS — N529 Male erectile dysfunction, unspecified: Secondary | ICD-10-CM | POA: Insufficient documentation

## 2012-01-14 DIAGNOSIS — Z8546 Personal history of malignant neoplasm of prostate: Secondary | ICD-10-CM | POA: Insufficient documentation

## 2012-01-14 DIAGNOSIS — C61 Malignant neoplasm of prostate: Secondary | ICD-10-CM

## 2012-01-14 NOTE — Progress Notes (Signed)
Labs drawn today for psa

## 2012-01-15 ENCOUNTER — Other Ambulatory Visit (HOSPITAL_COMMUNITY): Payer: Medicare Other

## 2012-01-20 ENCOUNTER — Encounter (HOSPITAL_COMMUNITY): Payer: Self-pay | Admitting: Oncology

## 2012-01-20 ENCOUNTER — Encounter (HOSPITAL_BASED_OUTPATIENT_CLINIC_OR_DEPARTMENT_OTHER): Payer: Medicare Other | Admitting: Oncology

## 2012-01-20 VITALS — BP 162/77 | HR 75 | Temp 97.4°F | Resp 18 | Wt 188.8 lb

## 2012-01-20 DIAGNOSIS — C61 Malignant neoplasm of prostate: Secondary | ICD-10-CM

## 2012-01-20 DIAGNOSIS — N529 Male erectile dysfunction, unspecified: Secondary | ICD-10-CM

## 2012-01-20 DIAGNOSIS — R350 Frequency of micturition: Secondary | ICD-10-CM

## 2012-01-20 MED ORDER — TAMSULOSIN HCL 0.4 MG PO CAPS: 0.4000 mg | ORAL_CAPSULE | Freq: Every day | ORAL | Status: DC

## 2012-01-20 NOTE — Progress Notes (Signed)
[name], MD [address] [place] [address]  1. Prostate cancer     CURRENT THERAPY: Observation with regular PSA  INTERVAL HISTORY: Richard Swanson 77 y.o. male returns for  regular  visit for followup of History of a Gleason's score 5 prostate cancer.  S/p radiation therapy by Dr. Valere Dross on 08/01/10- 09/14/10 and s/p radical prostatectomy in March 2006 by Dr. Michela Pitcher.   I personally reviewed and went over radiographic studies with the patient.  His PSAs remains very stable at 0.01.  We will continue monitoring with every 3 month PSAs.    Richard Swanson's only complaint is frequent nightly urination.  He reports that he is up every 2 hours urinating.  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  So I provided him some education regarding this issue.  We will try FloMax 0.4 mg tablets daily.  He will call us in 2-3 weeks to let us know if this is effective.  If not, will increase to 0.8 mg daily to maximize the dose.  He is agreeable to this plan.    Otherwise, Richard Swanson denies any complaints.  Complete ROS questioning is negative.    Past Medical History  Diagnosis Date  . Hypertension   . Cancer     Prostate  . Prostate cancer 11/03/2010    dx 2006    has DYSPEPSIA; CHEST PAIN, DULL; and Prostate cancer on his problem list.      has no known allergies.  Richard Swanson had no medications administered during this visit.  Past Surgical History  Procedure Date  . Prostate surgery     Denies any headaches, dizziness, double vision, fevers, chills, night sweats, nausea, vomiting, diarrhea, constipation, chest pain, heart palpitations, shortness of breath, blood in stool, black tarry stool, urinary pain, urinary burning, urinary frequency, hematuria.   PHYSICAL EXAMINATION  ECOG PERFORMANCE STATUS: 1 - Symptomatic but completely ambulatory  Filed Vitals:   01/20/12 1200  BP: 162/77  Pulse: 75  Temp: 97.4 F (36.3 C)  Resp:  18    GENERAL:alert, healthy, no distress, well nourished, well developed, comfortable, cooperative and smiling SKIN: skin color, texture, turgor are normal, no rashes or significant lesions HEAD: Normocephalic, No masses, lesions, tenderness or abnormalities EYES: normal, Conjunctiva are pink and non-injected EARS: External ears normal OROPHARYNX:lips, buccal mucosa, and tongue normal and mucous membranes are moist  NECK: supple, trachea midline LYMPH:  Not examined BREAST:not examined LUNGS: clear to auscultation and percussion HEART: regular rate & rhythm, no murmurs, no gallops, S1 normal and S2 normal ABDOMEN:abdomen soft, non-tender and normal bowel sounds BACK: Back symmetric, no curvature. EXTREMITIES:less then 2 second capillary refill, no joint deformities, effusion, or inflammation, no edema, no skin discoloration, no clubbing, no cyanosis  NEURO: alert & oriented x 3 with fluent speech, no focal motor/sensory deficits, gait normal   LABORATORY DATA: CBC    Component Value Date/Time   WBC 5.0 10/07/2011 1137   RBC 4.77 10/07/2011 1137   HGB 13.3 10/07/2011 1137   HCT 37.2* 10/07/2011 1137   PLT 159 10/07/2011 1137   MCV 78.0 10/07/2011 1137   MCH 27.9 10/07/2011 1137   MCHC 35.8 10/07/2011 1137   RDW 13.3 10/07/2011 1137   LYMPHSABS 0.8 10/07/2011 1137   MONOABS 0.3 10/07/2011 1137   EOSABS 0.1 10/07/2011 1137   BASOSABS 0.0 10/07/2011 1137      Chemistry      Component Value Date/Time   NA  136 10/07/2011 1137   K 4.0 10/07/2011 1137   CL 97 10/07/2011 1137   CO2 26 10/07/2011 1137   BUN 20 10/07/2011 1137   CREATININE 1.76* 10/07/2011 1137      Component Value Date/Time   CALCIUM 10.2 10/07/2011 1137   ALKPHOS 68 10/07/2011 1137   AST 30 10/07/2011 1137   ALT 30 10/07/2011 1137   BILITOT 0.5 10/07/2011 1137     Lab Results  Component Value Date   PSA <0.01* 01/14/2012   PSA <0.01* 10/07/2011   PSA 0.01* 07/04/2011      ASSESSMENT:  1. Prostate cancer, S/P radiation  therapy by Dr. Valere Dross on 08/01/10- 09/14/10 and s/p radical prostatectomy in March 2006 by Dr. Michela Pitcher.  2. Erectile dysfunction, using a penile pump prior to intercourse  3. Frequent nightly urination.  PLAN:  1. I personally reviewed and went over laboratory results with the patient. 2. Lab work every 3 months: PSA 3. Lab work in 6 months: CBC diff, CMET, PSA 4. Will try FloMax 0.4 mg daily.  He will call if no improvement identified.  If no improvement, will increase to 0.8 mg daily and maximize the dose.  5. Return in 6 months for follow-up.   All questions were answered. The patient knows to call the clinic with any problems, questions or concerns. We can certainly see the patient much sooner if necessary.  The patient and plan discussed with Everardo All, MD and he is in agreement with the aforementioned.  KEFALAS,THOMAS

## 2012-01-20 NOTE — Patient Instructions (Addendum)
[facility]  Discharge Instructions  RECOMMENDATIONS MADE BY THE CONSULTANT AND ANY TEST RESULTS WILL BE SENT TO YOUR REFERRING DOCTOR.   RX for Flomax sent to [facility]. Let us know in 2-3 weeks how the medicine is working, we can increase dose if necessary. PSA level every 3 months. Return to clinic in 6 months to see MD. Report any issues/concerns to clinic prior to MD appointment if needed.   I acknowledge that I have been informed and understand all the instructions given to me and received a copy. I do not have any more questions at this time, but understand that I may call the [facility] Clinic at [facility] at [phone] during business hours should I have any further questions or need assistance in obtaining follow-up care.    __________________________________________  _____________  __________ Signature of Patient or Authorized Representative            Date                   Time    __________________________________________ Nurse's Signature

## 2012-01-23 IMAGING — CT CT ABD-PELV W/O CM
2 of 4 series · 16 of 46 positions shown, 18 images · non-contrast
Comparison: 03/04/2009.

CLINICAL DATA: Prostate cancer.  Recurrent adenocarcinoma the
prostate.

CT ABDOMEN AND PELVIS WITHOUT CONTRAST
TECHNIQUE: Multidetector CT imaging of the abdomen and pelvis was
performed following the standard protocol without intravenous
contrast.

[Series 2: abd|pel w/o 5.0 b40f · axial · non-contrast · 0.71mm/px · z∈[-429,+21]mm · 13 of 100 slices shown, 15 images]
[im 5/100  soft-tissue]
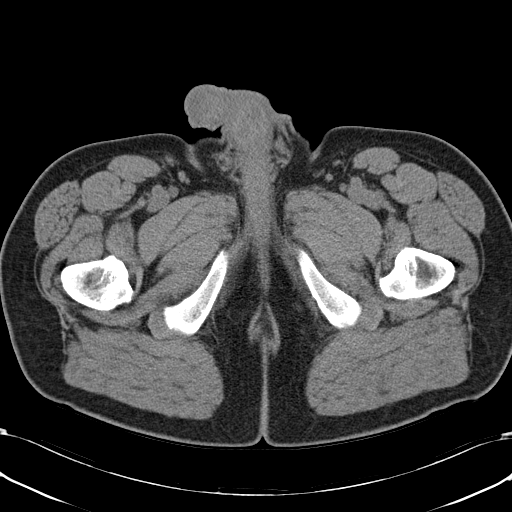
[im 5/100  bone]
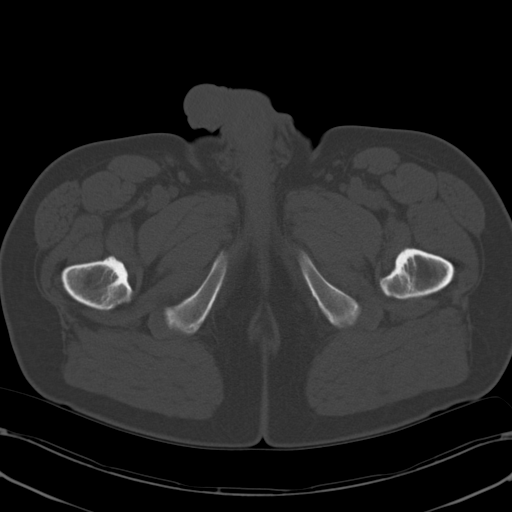
[im 13/100  soft-tissue]
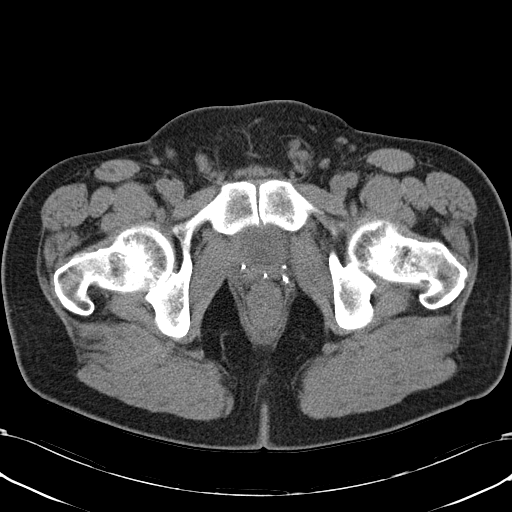
[im 21/100  soft-tissue]
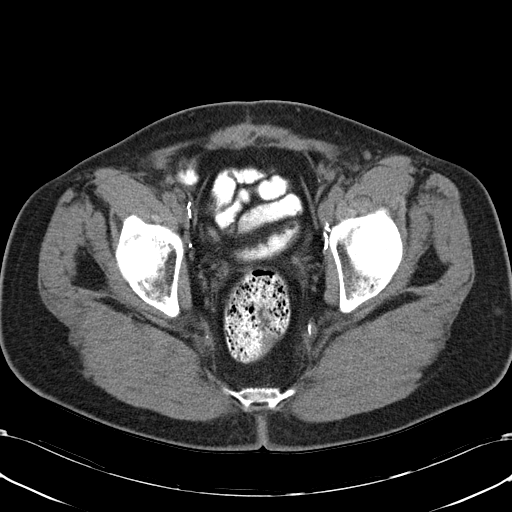
[im 29/100  soft-tissue]
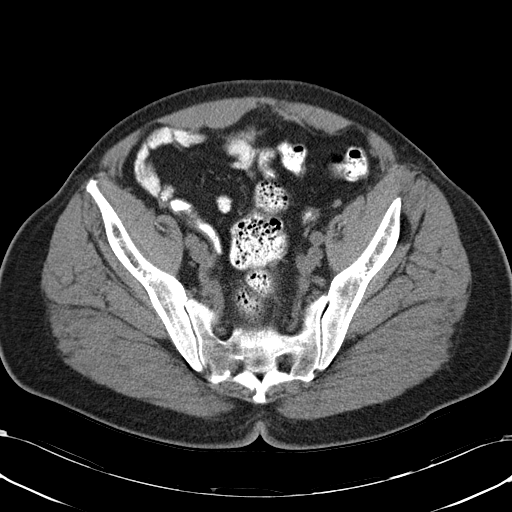
[im 34/100  soft-tissue]
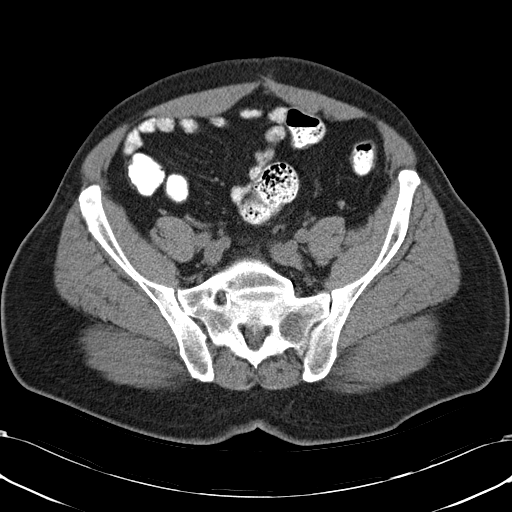
[im 42/100  soft-tissue]
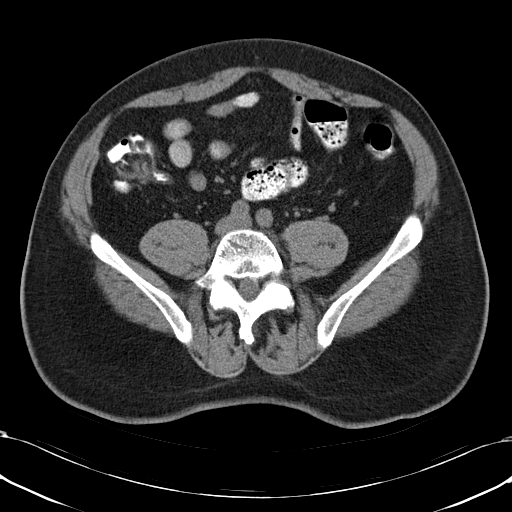
[im 50/100  soft-tissue]
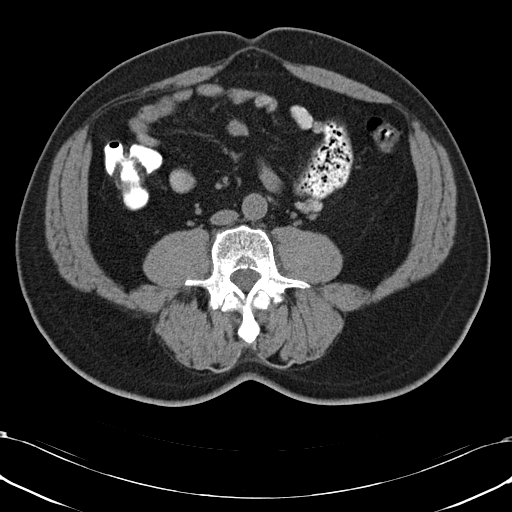
[im 58/100  soft-tissue]
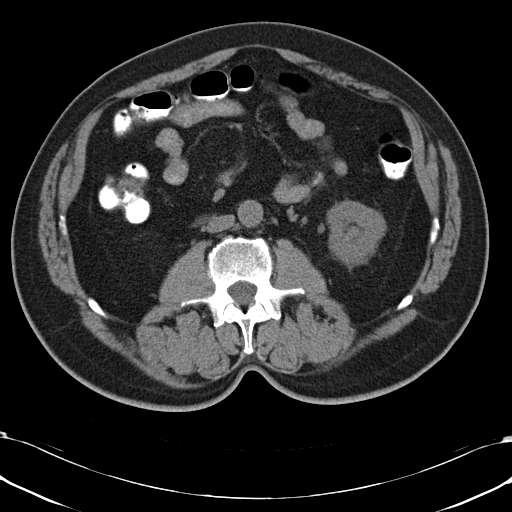
[im 67/100  soft-tissue]
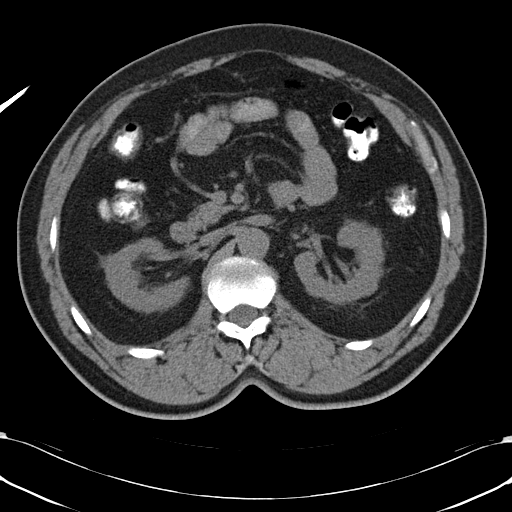
[im 67/100  bone]
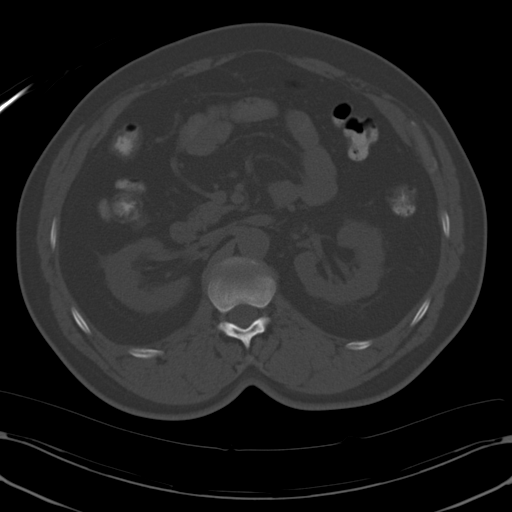
[im 71/100  soft-tissue]
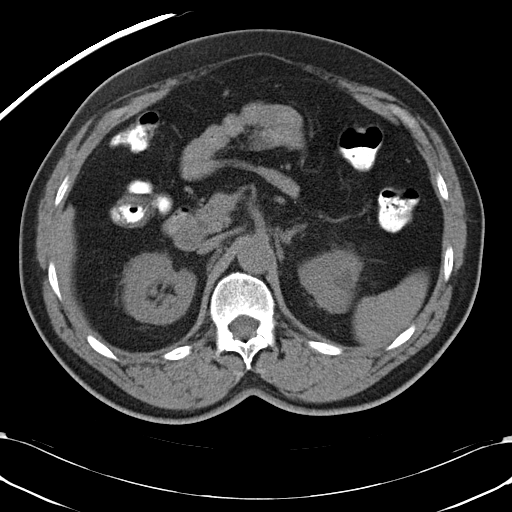
[im 79/100  soft-tissue]
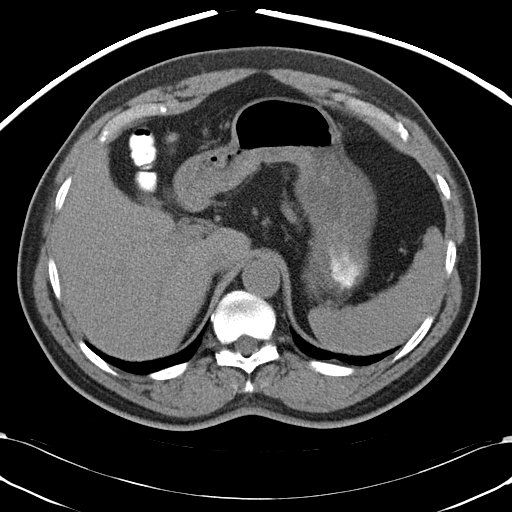
[im 87/100  soft-tissue]
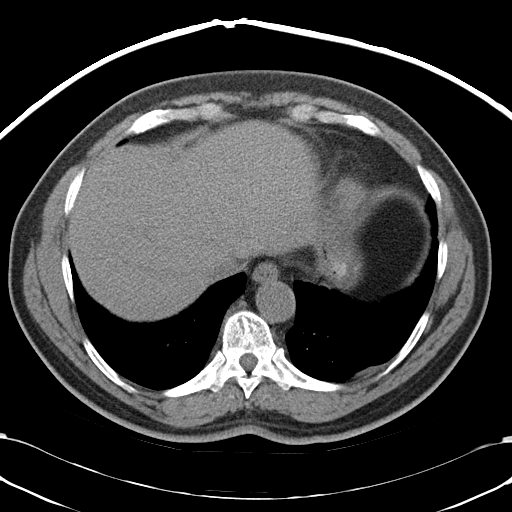
[im 95/100  soft-tissue]
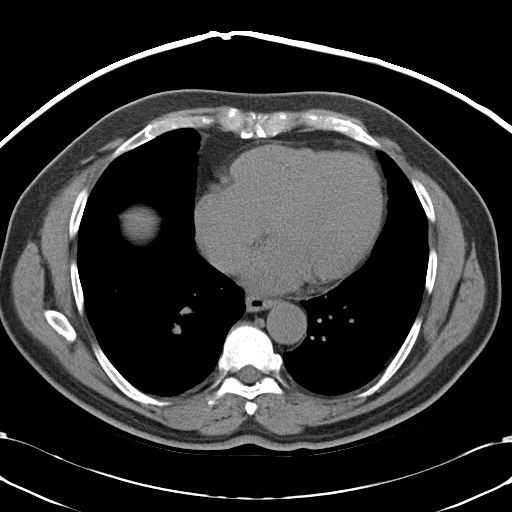

[Series 4: mpr cor (id) · coronal · 0.69mm/px · 3 of 92 slices shown]
[im 31/92  soft-tissue]
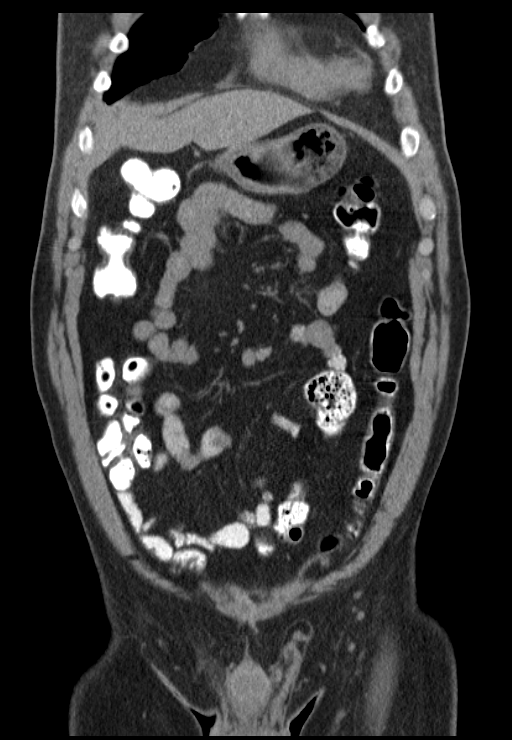
[im 41/92  soft-tissue]
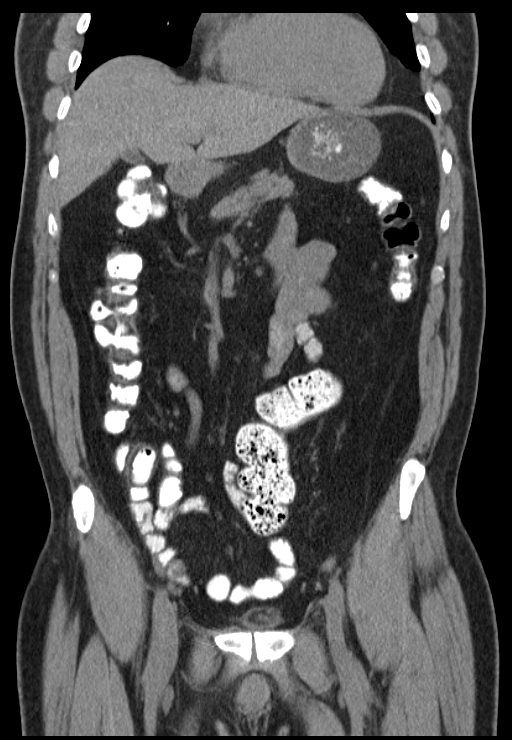
[im 51/92  soft-tissue]
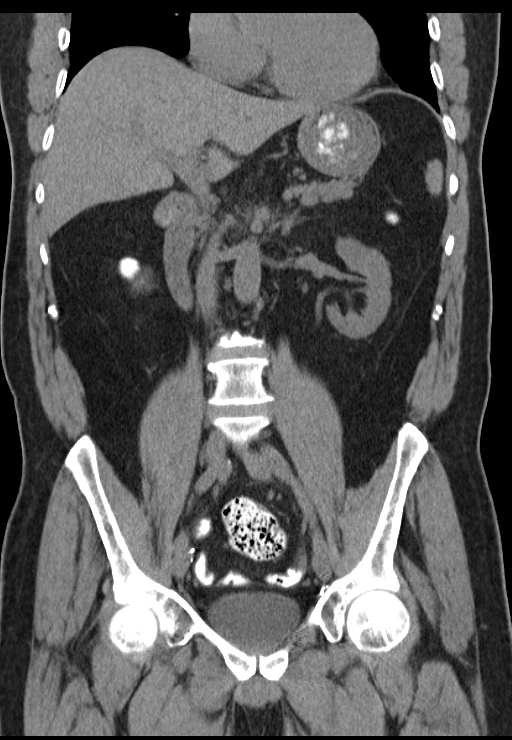

[16 of 46 positions shown; findings below may reference images not displayed]

FINDINGS: Lung bases demonstrate dependent atelectasis. Unenhanced
CT was performed per clinician order.  Lack of IV contrast limits
sensitivity and specificity, especially for evaluation of
abdominal/pelvic solid viscera.  Solid abdominal viscera appear
within normal limits.  Mild abdominal aortic atherosclerosis.  13
mm left upper pole renal cystic lesion, likely simple cyst.  14 mm
interpolar renal cystic lesion, also likely simple cyst.  Normal
appendix identified.  Colon and rectum appear normal.  Normal
appendix.  Small bowel appears within normal limits.  Mild fatty
infiltration of the anterior bladder wall, which may represent post
radiation changes or chronic cystitis.  Prostatectomy.  Surgical
clips consistent with pelvic lymphadenectomy.  There is no inguinal
adenopathy.  No pelvic adenopathy is identified.

There is an unchanged 8 mm x 13 mm mm sclerotic lesion in the right
superior pubic ramus, which may represent treated prostate cancer
or large bone island.  Lumbosacral  transitional anatomy present.
IMPRESSION: 1.  Prostatectomy and pelvic lymphadenectomy.  No evidence of
recurrence.
2.  Stable 8 mm by 13 mm right superior pubic ramus sclerotic
lesion which may represent a large bone island or treated prostate
cancer metastatic lesion.
3.  Left renal cysts.

## 2012-02-12 ENCOUNTER — Telehealth (HOSPITAL_COMMUNITY): Payer: Self-pay | Admitting: Oncology

## 2012-02-12 DIAGNOSIS — C61 Malignant neoplasm of prostate: Secondary | ICD-10-CM

## 2012-02-12 MED ORDER — TAMSULOSIN HCL 0.4 MG PO CAPS: 0.8000 mg | ORAL_CAPSULE | Freq: Every day | ORAL | Status: DC

## 2012-02-12 NOTE — Telephone Encounter (Signed)
Hello Mr. Spoonemore,  We can double the FloMax dose and you can take 2 tablets daily (total dose of 0.8 mg).  That is the maximum dose.  Try that and let me know how it works for you.  I will send in a new prescription for you since you will run out early due to the dose increase and it will be available for you when you need it.  Thank you Minister.  Kirby Crigler.  -----Original Message----- From: Donne Hazel [[email or web address]]  Sent: Wednesday, February 12, 2012 4:21 PM To: Lenna Sciara Subject: Medical Advice  Dr. Marcello Moores, I appreciate you being my doctor. I was in your office two weeks ago and I inform you that I was getting up three times during the night going to the bath room and you wrote a subscription for Flowmax which I have been taking with little result in slowing down my bladder.  Can you tell me what my next step should be. I do drink a lot of water. Thanks for your at- tension to this matter.  Donne Hazel Sent from my iPad

## 2012-04-03 ENCOUNTER — Encounter (HOSPITAL_COMMUNITY): Payer: Medicare Other | Attending: Oncology

## 2012-04-03 DIAGNOSIS — C61 Malignant neoplasm of prostate: Secondary | ICD-10-CM

## 2012-04-03 LAB — PSA: PSA: 0.01 ng/mL — ABNORMAL LOW (ref <=4.00)

## 2012-05-05 LAB — HM DIABETES FOOT EXAM

## 2012-05-06 ENCOUNTER — Encounter: Payer: Self-pay | Admitting: "Endocrinology

## 2012-05-21 ENCOUNTER — Other Ambulatory Visit (HOSPITAL_COMMUNITY): Payer: Medicare Other

## 2012-07-03 ENCOUNTER — Other Ambulatory Visit (HOSPITAL_COMMUNITY): Payer: Medicare Other

## 2012-07-06 ENCOUNTER — Ambulatory Visit (HOSPITAL_COMMUNITY): Payer: Medicare Other | Admitting: Oncology

## 2012-07-09 ENCOUNTER — Encounter (HOSPITAL_COMMUNITY): Payer: Medicare Other | Attending: Oncology

## 2012-07-09 DIAGNOSIS — C61 Malignant neoplasm of prostate: Secondary | ICD-10-CM

## 2012-07-09 LAB — COMPREHENSIVE METABOLIC PANEL
ALT: 16 U/L (ref 0–53)
AST: 19 U/L (ref 0–37)
Albumin: 3.9 g/dL (ref 3.5–5.2)
CO2: 23 mEq/L (ref 19–32)
Calcium: 9.8 mg/dL (ref 8.4–10.5)
Creatinine, Ser: 1.56 mg/dL — ABNORMAL HIGH (ref 0.50–1.35)
Sodium: 137 mEq/L (ref 135–145)
Total Protein: 7.8 g/dL (ref 6.0–8.3)

## 2012-07-09 LAB — CBC WITH DIFFERENTIAL/PLATELET
Basophils Absolute: 0 10*3/uL (ref 0.0–0.1)
Basophils Relative: 0 % (ref 0–1)
Eosinophils Absolute: 0.1 10*3/uL (ref 0.0–0.7)
Eosinophils Relative: 2 % (ref 0–5)
Lymphocytes Relative: 22 % (ref 12–46)
MCHC: 36.5 g/dL — ABNORMAL HIGH (ref 30.0–36.0)
MCV: 77.1 fL — ABNORMAL LOW (ref 78.0–100.0)
Platelets: 181 10*3/uL (ref 150–400)
RDW: 13.3 % (ref 11.5–15.5)
WBC: 5.9 10*3/uL (ref 4.0–10.5)

## 2012-07-09 LAB — TESTOSTERONE: Testosterone: 249 ng/dL — ABNORMAL LOW (ref 300–890)

## 2012-07-10 ENCOUNTER — Ambulatory Visit (HOSPITAL_COMMUNITY): Payer: Medicare Other | Admitting: Oncology

## 2012-07-10 ENCOUNTER — Other Ambulatory Visit (HOSPITAL_COMMUNITY): Payer: Self-pay | Admitting: Oncology

## 2012-07-10 LAB — TESTOSTERONE, % FREE: Testosterone-% Free: 1.9 % — ABNORMAL HIGH (ref 1.6–2.9)

## 2012-07-10 LAB — TESTOSTERONE, FREE: Testosterone, Free: 46.4 pg/mL — ABNORMAL LOW (ref 47.0–244.0)

## 2012-07-10 LAB — SEX HORMONE BINDING GLOBULIN: Sex Hormone Binding: 35 nmol/L (ref 13–71)

## 2012-07-15 ENCOUNTER — Encounter (HOSPITAL_COMMUNITY): Payer: Self-pay | Admitting: Oncology

## 2012-07-15 ENCOUNTER — Encounter (HOSPITAL_BASED_OUTPATIENT_CLINIC_OR_DEPARTMENT_OTHER): Payer: Medicare Other | Admitting: Oncology

## 2012-07-15 VITALS — BP 133/67 | HR 63 | Temp 98.5°F | Resp 18 | Wt 182.2 lb

## 2012-07-15 DIAGNOSIS — N529 Male erectile dysfunction, unspecified: Secondary | ICD-10-CM

## 2012-07-15 DIAGNOSIS — C61 Malignant neoplasm of prostate: Secondary | ICD-10-CM

## 2012-07-15 DIAGNOSIS — E119 Type 2 diabetes mellitus without complications: Secondary | ICD-10-CM

## 2012-07-15 NOTE — Progress Notes (Signed)
.  [place] Discharge Instructions  RECOMMENDATIONS MADE BY THE CONSULTANT AND ANY TEST RESULTS WILL BE SENT TO YOUR REFERRING PHYSICIAN.  EXAM FINDINGS BY THE PHYSICIAN TODAY AND SIGNS OR SYMPTOMS TO REPORT TO CLINIC OR PRIMARY PHYSICIAN:    SPECIAL INSTRUCTIONS/FOLLOW-UP: Labs in 3 months and 6 months then to see [place] See Dr Tressie Stalker in 1 year  Thank you for choosing [place] to provide your oncology and hematology care.  To afford each patient quality time with our providers, please arrive at least 15 minutes before your scheduled appointment time.  With your help, our goal is to use those 15 minutes to complete the necessary work-up to ensure our physicians have the information they need to help with your evaluation and healthcare recommendations.    Effective January 1st, 2014, we ask that you re-schedule your appointment with our physicians should you arrive 10 or more minutes late for your appointment.  We strive to give you quality time with our providers, and arriving late affects you and other patients whose appointments are after yours.    Again, thank you for choosing [facility].  Our hope is that these requests will decrease the amount of time that you wait before being seen by our physicians.       _____________________________________________________________  Should you have questions after your visit to [facility], please contact our office at [phone] between the hours of 8:30 a.m. and 5:00 p.m.  Voicemails left after 4:30 p.m. will not be returned until the following business day.  For prescription refill requests, have your pharmacy contact our office with your prescription refill request.

## 2012-07-15 NOTE — Progress Notes (Signed)
Richard Bogus, MD [place] [address]  Prostate cancer  [place] with regular PSA   INTERVAL HISTORY: Richard Swanson 78 y.o. male returns for  regular  visit for followup of History of a Gleason's score 5 prostate cancer. S/p radiation therapy by Dr. Valere Dross on 08/01/10- 09/14/10 and s/p radical prostatectomy in March 2006 by Dr. Michela Pitcher.   His PSAs remains very stable at 0.01. We will continue monitoring with every 3 month PSAs.  Richard Swanson was recently diagnosed with diabetes by his PCP, Dr. Luan Pulling.  He is now on Metformin.  His urinary nightly frequency has improved (as a matter of fact resolved) since starting this medication.  He is tolerating Metformin well and denies any nausea, vomiting, or diarrhea.  Oncologically, he denies any complaints and ROS questioning is negative.    I personally reviewed and went over laboratory results with the patient.  His CBC is unremarkable and his CMET is impressive for renal insufficiency and hyerglycemia.    Past Medical History  Diagnosis Date  . Hypertension   . Cancer     Prostate  . Prostate cancer 11/03/2010    dx 2006  . Diabetes mellitus without complication     has DYSPEPSIA; CHEST PAIN, DULL; and Prostate cancer on his problem list.     has No Known Allergies.  Richard Swanson had no medications administered during this visit.  Past Surgical History  Procedure Laterality Date  . Prostate surgery      Denies any headaches, dizziness, double vision, fevers, chills, night sweats, nausea, vomiting, diarrhea, constipation, chest pain, heart palpitations, shortness of breath, blood in stool, black tarry stool, urinary pain, urinary burning, urinary frequency, hematuria.   PHYSICAL EXAMINATION  ECOG PERFORMANCE STATUS: 0 - Asymptomatic  Filed Vitals:   07/15/12 1546  BP: 133/67  Pulse: 63  Temp: 98.5 F (36.9 C)  Resp: 18    GENERAL:alert, healthy, no distress, well  nourished, well developed, comfortable, cooperative and smiling  SKIN: skin color, texture, turgor are normal, no rashes or significant lesions  HEAD: Normocephalic, No masses, lesions, tenderness or abnormalities  EYES: normal, Conjunctiva are pink and non-injected  EARS: External ears normal  OROPHARYNX:lips, buccal mucosa, and tongue normal and mucous membranes are moist  NECK: supple, trachea midline, no lymphadenopathy LYMPH: No lymphadenopathy noted in neck, clavicle, and axillae BREAST:not examined  LUNGS: clear to auscultation and percussion  HEART: regular rate & rhythm, no murmurs, no gallops, S1 normal and S2 normal  ABDOMEN:abdomen soft, non-tender and normal bowel sounds, plaques of hyperpigmentation on abdomen, chronic, from rash many years ago.  BACK: Back symmetric, no curvature.  EXTREMITIES:less then 2 second capillary refill, no joint deformities, effusion, or inflammation, no edema, no skin discoloration, no clubbing, no cyanosis  NEURO: alert & oriented x 3 with fluent speech, no focal motor/sensory deficits, gait normal   LABORATORY DATA: CBC    Component Value Date/Time   WBC 5.9 07/09/2012 1306   RBC 4.72 07/09/2012 1306   HGB 13.3 07/09/2012 1306   HCT 36.4* 07/09/2012 1306   PLT 181 07/09/2012 1306   MCV 77.1* 07/09/2012 1306   MCH 28.2 07/09/2012 1306   MCHC 36.5* 07/09/2012 1306   RDW 13.3 07/09/2012 1306   LYMPHSABS 1.3 07/09/2012 1306   MONOABS 0.3 07/09/2012 1306   EOSABS 0.1 07/09/2012 1306   BASOSABS 0.0 07/09/2012 1306      Chemistry      Component Value Date/Time   NA  137 07/09/2012 1306   K 4.1 07/09/2012 1306   CL 101 07/09/2012 1306   CO2 23 07/09/2012 1306   BUN 17 07/09/2012 1306   CREATININE 1.56* 07/09/2012 1306      Component Value Date/Time   CALCIUM 9.8 07/09/2012 1306   ALKPHOS 61 07/09/2012 1306   AST 19 07/09/2012 1306   ALT 16 07/09/2012 1306   BILITOT 0.4 07/09/2012 1306     Lab Results  Component Value Date   PSA <0.01* 07/09/2012    PSA 0.01* 04/03/2012   PSA <0.01* 01/14/2012    Results for Richard Swanson, Richard Swanson (MRN [id number]) as of 07/15/2012 16:00  Ref. Range 07/09/2012 13:06  Sex Hormone Binding Latest Range: 13-71 nmol/L 35  Testosterone Latest Range: 300-890 ng/dL 249 (L)  Testosterone-% Freee. Latest Range: 1.6-2.9 % 1.9 (H)  Testosterone Free Latest Range: 47.0-244.0 pg/mL 46.4 (L)      ASSESSMENT:  1. Prostate cancer, S/P radiation therapy by Dr. Valere Dross on 08/01/10- 09/14/10 and s/p radical prostatectomy in March 2006 by Dr. Michela Pitcher.  2. Erectile dysfunction, using a penile pump prior to intercourse  3. Frequent nightly urination, resolved with Metformin 4. Recently diagnosed with diabetes, treated by PCP. On metformin 500 mg BID.   PLAN:  1. I personally reviewed and went over laboratory results with the patient.  2. Lab work every 3 months: PSA  3. Lab work in 6 months: CBC diff, CMET, PSA  4. Continue to follow-up with PCP as directed 5. Return in 6 months for follow-up.   All questions were answered. The patient knows to call the clinic with any problems, questions or concerns. We can certainly see the patient much sooner if necessary.  The patient and plan discussed with Everardo All, MD and he is in agreement with the aforementioned.  Richard Swanson

## 2012-08-19 ENCOUNTER — Other Ambulatory Visit (HOSPITAL_COMMUNITY): Payer: Medicare Other

## 2012-10-19 ENCOUNTER — Encounter (HOSPITAL_COMMUNITY): Payer: Medicare Other | Attending: Oncology

## 2012-10-19 DIAGNOSIS — C61 Malignant neoplasm of prostate: Secondary | ICD-10-CM | POA: Insufficient documentation

## 2012-10-19 NOTE — Progress Notes (Signed)
Labs drawn today for psa

## 2012-11-18 ENCOUNTER — Other Ambulatory Visit (HOSPITAL_COMMUNITY): Payer: Medicare Other

## 2013-01-15 ENCOUNTER — Encounter (HOSPITAL_COMMUNITY): Payer: Medicare Other | Attending: Hematology and Oncology

## 2013-01-15 DIAGNOSIS — Z09 Encounter for follow-up examination after completed treatment for conditions other than malignant neoplasm: Secondary | ICD-10-CM | POA: Insufficient documentation

## 2013-01-15 DIAGNOSIS — C61 Malignant neoplasm of prostate: Secondary | ICD-10-CM

## 2013-01-15 DIAGNOSIS — Z8546 Personal history of malignant neoplasm of prostate: Secondary | ICD-10-CM | POA: Insufficient documentation

## 2013-01-15 LAB — CBC WITH DIFFERENTIAL/PLATELET
Basophils Absolute: 0 10*3/uL (ref 0.0–0.1)
Basophils Relative: 1 % (ref 0–1)
Eosinophils Absolute: 0.1 10*3/uL (ref 0.0–0.7)
Eosinophils Relative: 2 % (ref 0–5)
Lymphs Abs: 0.8 10*3/uL (ref 0.7–4.0)
MCH: 28.4 pg (ref 26.0–34.0)
MCHC: 36.1 g/dL — ABNORMAL HIGH (ref 30.0–36.0)
MCV: 78.7 fL (ref 78.0–100.0)
Neutrophils Relative %: 72 % (ref 43–77)
Platelets: 150 10*3/uL (ref 150–400)
RBC: 4.51 MIL/uL (ref 4.22–5.81)
RDW: 12.8 % (ref 11.5–15.5)

## 2013-01-15 LAB — COMPREHENSIVE METABOLIC PANEL
ALT: 13 U/L (ref 0–53)
AST: 20 U/L (ref 0–37)
Albumin: 3.9 g/dL (ref 3.5–5.2)
Alkaline Phosphatase: 51 U/L (ref 39–117)
Calcium: 9.5 mg/dL (ref 8.4–10.5)
GFR calc Af Amer: 46 mL/min — ABNORMAL LOW (ref >90)
Glucose, Bld: 166 mg/dL — ABNORMAL HIGH (ref 70–99)
Potassium: 4.1 mEq/L (ref 3.5–5.1)
Sodium: 137 mEq/L (ref 135–145)
Total Protein: 7.3 g/dL (ref 6.0–8.3)

## 2013-01-15 NOTE — Progress Notes (Signed)
Labs drawn today for cbc/diff,cmp,psa

## 2013-01-19 ENCOUNTER — Encounter (HOSPITAL_BASED_OUTPATIENT_CLINIC_OR_DEPARTMENT_OTHER): Payer: Medicare Other | Admitting: Oncology

## 2013-01-19 ENCOUNTER — Encounter (HOSPITAL_COMMUNITY): Payer: Self-pay | Admitting: Oncology

## 2013-01-19 VITALS — BP 131/77 | HR 70 | Temp 97.6°F | Resp 16 | Wt 173.8 lb

## 2013-01-19 DIAGNOSIS — C61 Malignant neoplasm of prostate: Secondary | ICD-10-CM

## 2013-01-19 NOTE — Progress Notes (Signed)
Richard Bogus, MD [place]  [address]  Prostate cancer  CURRENT THERAPY: Observation with regular PSA  INTERVAL HISTORY: Richard Swanson 78 y.o. male returns for  regular  visit for followup of History of a Gleason's score 5 prostate cancer. S/p radiation therapy by Dr. Valere Dross on 08/01/10- 09/14/10 and s/p radical prostatectomy in March 2006 by Dr. Michela Pitcher.  His PSAs remains very stable at 0.01. We will continue monitoring with every 3 month PSAs.  I personally reviewed and went over laboratory results with the patient.  His other labs show a minimal anemia with Hgb of 12.8 g/dL, minimally low WBC at 3.8 with a normal differential.  Renal insufficiency is stable.  He recently has upper teeth extraction in preparation for implants of teeth which has caused some weight loss.  He reports that his appetite is strong, but there was a time when he had difficulty eating which is the cause of his recent weight loss.   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   He recently took 2 weeks off and traveled to [place], [place], [place], [place], [place], and [place] to visit family members.    Oncologically, he denies any complaints and ROS questioning is negative.    Past Medical History  Diagnosis Date  . Hypertension   . Cancer     Prostate  . Prostate cancer 11/03/2010    dx 2006  . Diabetes mellitus without complication     has DYSPEPSIA; CHEST PAIN, DULL; and Prostate cancer on his problem list.     has No Known Allergies.  Mr. [name] does not currently have medications on file.  Past Surgical History  Procedure Laterality Date  . Prostate surgery      Denies any headaches, dizziness, double vision, fevers, chills, night sweats, nausea, vomiting, diarrhea, constipation, chest pain, heart palpitations, shortness of breath, blood in stool, black tarry  stool, urinary pain, urinary burning, urinary frequency, hematuria.   PHYSICAL EXAMINATION  ECOG PERFORMANCE STATUS: 0 - Asymptomatic  Filed Vitals:   01/19/13 1000  BP: 131/77  Pulse: 70  Temp: 97.6 F (36.4 C)  Resp: 16    GENERAL:alert, no distress, well nourished, well developed, comfortable, cooperative and smiling, appears younger than stated age SKIN: skin color, texture, turgor are normal, no rashes or significant lesions HEAD: Normocephalic, No masses, lesions, tenderness or abnormalities EYES: normal, PERRLA, EOMI, Conjunctiva are pink and non-injected EARS: External ears normal OROPHARYNX:mucous membranes are moist  NECK: supple, no adenopathy, thyroid normal size, non-tender, without nodularity, no stridor, non-tender, trachea midline LYMPH:  no palpable lymphadenopathy, no hepatosplenomegaly BREAST:not examined LUNGS: clear to auscultation and percussion HEART: regular rate & rhythm, no murmurs, no gallops, S1 normal and S2 normal ABDOMEN:abdomen soft, non-tender, normal bowel sounds, no masses or organomegaly and no hepatosplenomegaly BACK: Back symmetric, no curvature., No CVA tenderness EXTREMITIES:less then 2 second capillary refill, no joint deformities, effusion, or inflammation, no edema, no skin discoloration, no clubbing, no cyanosis  NEURO: alert & oriented x 3 with fluent speech, no focal motor/sensory deficits, gait normal    LABORATORY DATA: CBC    Component Value Date/Time   WBC 3.8* 01/15/2013 1024   RBC 4.51 01/15/2013 1024   HGB 12.8* 01/15/2013 1024   HCT 35.5* 01/15/2013 1024   PLT 150 01/15/2013 1024   MCV 78.7 01/15/2013 1024   MCH 28.4 01/15/2013 1024  MCHC 36.1* 01/15/2013 1024   RDW 12.8 01/15/2013 1024   LYMPHSABS 0.8 01/15/2013 1024   MONOABS 0.2 01/15/2013 1024   EOSABS 0.1 01/15/2013 1024   BASOSABS 0.0 01/15/2013 1024      Chemistry      Component Value Date/Time   NA 137 01/15/2013 1024   K 4.1 01/15/2013 1024   CL 101 01/15/2013  1024   CO2 29 01/15/2013 1024   BUN 17 01/15/2013 1024   CREATININE 1.63* 01/15/2013 1024      Component Value Date/Time   CALCIUM 9.5 01/15/2013 1024   ALKPHOS 51 01/15/2013 1024   AST 20 01/15/2013 1024   ALT 13 01/15/2013 1024   BILITOT 0.7 01/15/2013 1024     Lab Results  Component Value Date   PSA <0.01* 01/15/2013   PSA 0.01* 10/19/2012   PSA <0.01* 07/09/2012      ASSESSMENT:  1. Prostate cancer, S/P radiation therapy by Dr. Valere Dross on 08/01/10- 09/14/10 and s/p radical prostatectomy in March 2006 by Dr. Michela Pitcher.  2. Erectile dysfunction, using a penile pump prior to intercourse  3. Frequent nightly urination, resolved with Metformin  4. Recently diagnosed with diabetes, treated by PCP. On metformin 500 mg BID.    Patient Active Problem List   Diagnosis Date Noted  . Prostate cancer 11/03/2010  . DYSPEPSIA 08/30/2008  . CHEST PAIN, DULL 08/30/2008    PLAN:  1. I personally reviewed and went over laboratory results with the patient. 2. Labs in 3 months and 6 months: CBC diff, CMET, PSA 3. Follow-up with PCP as directed 4. Return in 6 months for follow-up.   THERAPY PLAN:  His PSA is very stable and we will continue to observe with PSA checks.  If he were to have a biochemical relapse, he may be a very good candidate for Xtandi, but for now, he is doing well with observation only.  All questions were answered. The patient knows to call the clinic with any problems, questions or concerns. We can certainly see the patient much sooner if necessary.  Patient and plan discussed with Dr. Farrel Gobble and he is in agreement with the aforementioned.   Latrish Mogel

## 2013-01-19 NOTE — Patient Instructions (Addendum)
[place] Discharge Instructions  RECOMMENDATIONS MADE BY THE CONSULTANT AND ANY TEST RESULTS WILL BE SENT TO YOUR REFERRING PHYSICIAN.   MEDICATIONS PRESCRIBED:  None  INSTRUCTIONS GIVEN AND DISCUSSED: Follow-up with primary car physician as directed  SPECIAL INSTRUCTIONS/FOLLOW-UP: Labs in 3 months and 6 months. Return to the clinic for follow-up appointment in 6 months.   Thank you for choosing [place] to provide your oncology and hematology care.  To afford each patient quality time with our providers, please arrive at least 15 minutes before your scheduled appointment time.  With your help, our goal is to use those 15 minutes to complete the necessary work-up to ensure our physicians have the information they need to help with your evaluation and healthcare recommendations.    Effective January 1st, 2014, we ask that you re-schedule your appointment with our physicians should you arrive 10 or more minutes late for your appointment.  We strive to give you quality time with our providers, and arriving late affects you and other patients whose appointments are after yours.    Again, thank you for choosing [facility].  Our hope is that these requests will decrease the amount of time that you wait before being seen by our physicians.       _____________________________________________________________  Should you have questions after your visit to [facility], please contact our office at [phone] between the hours of 8:30 a.m. and 5:00 p.m.  Voicemails left after 4:30 p.m. will not be returned until the following business day.  For prescription refill requests, have your pharmacy contact our office with your prescription refill request.

## 2013-02-18 ENCOUNTER — Other Ambulatory Visit (HOSPITAL_COMMUNITY): Payer: Medicare Other

## 2013-03-04 ENCOUNTER — Other Ambulatory Visit: Payer: Self-pay

## 2013-04-19 ENCOUNTER — Encounter (HOSPITAL_COMMUNITY): Payer: Medicare Other | Attending: Hematology and Oncology

## 2013-04-19 DIAGNOSIS — C61 Malignant neoplasm of prostate: Secondary | ICD-10-CM | POA: Insufficient documentation

## 2013-04-19 LAB — CBC WITH DIFFERENTIAL/PLATELET
Basophils Relative: 1 % (ref 0–1)
Eosinophils Absolute: 0.2 10*3/uL (ref 0.0–0.7)
Eosinophils Relative: 5 % (ref 0–5)
HCT: 33.4 % — ABNORMAL LOW (ref 39.0–52.0)
Hemoglobin: 11.8 g/dL — ABNORMAL LOW (ref 13.0–17.0)
Lymphs Abs: 1.1 10*3/uL (ref 0.7–4.0)
MCH: 27.9 pg (ref 26.0–34.0)
MCHC: 35.3 g/dL (ref 30.0–36.0)
MCV: 79 fL (ref 78.0–100.0)
Monocytes Absolute: 0.3 10*3/uL (ref 0.1–1.0)
Monocytes Relative: 6 % (ref 3–12)
RBC: 4.23 MIL/uL (ref 4.22–5.81)

## 2013-04-19 LAB — COMPREHENSIVE METABOLIC PANEL
Albumin: 3.7 g/dL (ref 3.5–5.2)
Alkaline Phosphatase: 53 U/L (ref 39–117)
BUN: 23 mg/dL (ref 6–23)
Creatinine, Ser: 1.8 mg/dL — ABNORMAL HIGH (ref 0.50–1.35)
GFR calc Af Amer: 40 mL/min — ABNORMAL LOW (ref >90)
Glucose, Bld: 139 mg/dL — ABNORMAL HIGH (ref 70–99)
Total Protein: 7.2 g/dL (ref 6.0–8.3)

## 2013-04-19 LAB — PSA: PSA: <0.01 ng/mL — ABNORMAL LOW (ref <=4.00)

## 2013-04-19 NOTE — Progress Notes (Signed)
[name]'s reason for visit today are for labs as scheduled per MD orders.  Venipuncture performed with a 23 gauge butterfly needle to R Antecubital.  Richard Swanson tolerated venipuncture well and without incident; questions were answered and patient was discharged.

## 2013-05-12 ENCOUNTER — Encounter (HOSPITAL_COMMUNITY): Payer: Self-pay | Admitting: Emergency Medicine

## 2013-05-12 ENCOUNTER — Emergency Department (HOSPITAL_COMMUNITY)
Admission: EM | Admit: 2013-05-12 | Discharge: 2013-05-12 | Disposition: A | Discharge status: Home / Self Care | Payer: Medicare Other | Attending: Emergency Medicine | Admitting: Emergency Medicine

## 2013-05-12 DIAGNOSIS — I10 Essential (primary) hypertension: Secondary | ICD-10-CM | POA: Insufficient documentation

## 2013-05-12 DIAGNOSIS — R42 Dizziness and giddiness: Secondary | ICD-10-CM | POA: Insufficient documentation

## 2013-05-12 DIAGNOSIS — Z8546 Personal history of malignant neoplasm of prostate: Secondary | ICD-10-CM | POA: Insufficient documentation

## 2013-05-12 DIAGNOSIS — Z79899 Other long term (current) drug therapy: Secondary | ICD-10-CM | POA: Insufficient documentation

## 2013-05-12 DIAGNOSIS — E119 Type 2 diabetes mellitus without complications: Secondary | ICD-10-CM | POA: Insufficient documentation

## 2013-05-12 LAB — CBC WITH DIFFERENTIAL/PLATELET
Basophils Absolute: 0 10*3/uL (ref 0.0–0.1)
Basophils Relative: 0 % (ref 0–1)
EOS PCT: 3 % (ref 0–5)
Eosinophils Absolute: 0.1 10*3/uL (ref 0.0–0.7)
HEMATOCRIT: 35.4 % — AB (ref 39.0–52.0)
HEMOGLOBIN: 13.1 g/dL (ref 13.0–17.0)
LYMPHS ABS: 0.9 10*3/uL (ref 0.7–4.0)
LYMPHS PCT: 21 % (ref 12–46)
MCH: 28.9 pg (ref 26.0–34.0)
MCHC: 37 g/dL — ABNORMAL HIGH (ref 30.0–36.0)
MCV: 78.1 fL (ref 78.0–100.0)
MONOS PCT: 6 % (ref 3–12)
Monocytes Absolute: 0.3 10*3/uL (ref 0.1–1.0)
Neutro Abs: 3.2 10*3/uL (ref 1.7–7.7)
Neutrophils Relative %: 70 % (ref 43–77)
PLATELETS: 158 10*3/uL (ref 150–400)
RBC: 4.53 MIL/uL (ref 4.22–5.81)
RDW: 13.1 % (ref 11.5–15.5)
WBC: 4.5 10*3/uL (ref 4.0–10.5)

## 2013-05-12 LAB — BASIC METABOLIC PANEL
BUN: 19 mg/dL (ref 6–23)
CALCIUM: 9.7 mg/dL (ref 8.4–10.5)
CO2: 27 meq/L (ref 19–32)
Chloride: 101 mEq/L (ref 96–112)
Creatinine, Ser: 1.58 mg/dL — ABNORMAL HIGH (ref 0.50–1.35)
GFR calc Af Amer: 47 mL/min — ABNORMAL LOW (ref >90)
GFR calc non Af Amer: 41 mL/min — ABNORMAL LOW (ref >90)
GLUCOSE: 121 mg/dL — AB (ref 70–99)
Potassium: 4.2 mEq/L (ref 3.7–5.3)
SODIUM: 141 meq/L (ref 137–147)

## 2013-05-12 LAB — GLUCOSE, CAPILLARY: Glucose-Capillary: 113 mg/dL — ABNORMAL HIGH (ref 70–99)

## 2013-05-12 MED ORDER — MECLIZINE HCL 12.5 MG PO TABS
25.0000 mg | ORAL_TABLET | Freq: Once | ORAL | Status: AC
  Administered 2013-05-12: 25 mg via ORAL
  Filled 2013-05-12: qty 2

## 2013-05-12 MED ORDER — MECLIZINE HCL 25 MG PO TABS: 25.0000 mg | ORAL_TABLET | Freq: Four times a day (QID) | ORAL | Status: DC

## 2013-05-12 NOTE — Discharge Instructions (Signed)
Benign Positional Vertigo Vertigo means you feel like you or your surroundings are moving when they are not. Benign positional vertigo is the most common form of vertigo. Benign means that the cause of your condition is not serious. Benign positional vertigo is more common in older adults. CAUSES  Benign positional vertigo is the result of an upset in the labyrinth system. This is an area in the middle ear that helps control your balance. This may be caused by a viral infection, head injury, or repetitive motion. However, often no specific cause is found. SYMPTOMS  Symptoms of benign positional vertigo occur when you move your head or eyes in different directions. Some of the symptoms may include:  Loss of balance and falls.  Vomiting.  Blurred vision.  Dizziness.  Nausea.  Involuntary eye movements (nystagmus). DIAGNOSIS  Benign positional vertigo is usually diagnosed by physical exam. If the specific cause of your benign positional vertigo is unknown, your caregiver may perform imaging tests, such as magnetic resonance imaging (MRI) or computed tomography (CT). TREATMENT  Your caregiver may recommend movements or procedures to correct the benign positional vertigo. Medicines such as meclizine, benzodiazepines, and medicines for nausea may be used to treat your symptoms. In rare cases, if your symptoms are caused by certain conditions that affect the inner ear, you may need surgery. HOME CARE INSTRUCTIONS   Follow your caregiver's instructions.  Move slowly. Do not make sudden body or head movements.  Avoid driving.  Avoid operating heavy machinery.  Avoid performing any tasks that would be dangerous to you or others during a vertigo episode.  Drink enough fluids to keep your urine clear or pale yellow. SEEK IMMEDIATE MEDICAL CARE IF:   You develop problems with walking, weakness, numbness, or using your arms, hands, or legs.  You have difficulty speaking.  You develop  severe headaches.  Your nausea or vomiting continues or gets worse.  You develop visual changes.  Your family or friends notice any behavioral changes.  Your condition gets worse.  You have a fever.  You develop a stiff neck or sensitivity to light. MAKE SURE YOU:   Understand these instructions.  Will watch your condition.  Will get help right away if you are not doing well or get worse. Document Released: 01/21/2006 Document Revised: 07/08/2011 Document Reviewed: 01/03/2011 [facility] Patient Information 2014 [place].   Medication for vertigo.   Rest. Return if worse in any way.

## 2013-05-12 NOTE — ED Notes (Signed)
Pt c/o feeling "llightheaded" x 1 week now. Pt states today felt really "wobbly" and was scared he was going to fall. Pt denies pain/weakness or any dizziness at this time. Pt stable from w/c to stretcher. Alert/oriented.

## 2013-05-12 NOTE — ED Provider Notes (Signed)
CSN: [id number]     Arrival date & time 05/12/13  1419 History  This chart was scribed for Nat Christen, MD by Elby Beck, ED Scribe. This patient was seen in room APA05/APA05 and the patient's care was started at 3:28 PM.  Chief Complaint  Patient presents with  . Dizziness    The history is provided by the patient. No language interpreter was used.    HPI Comments: Richard Swanson is a 79 y.o. male with a history of DM, HTN and prostate CA who presents to the Emergency Department complaining of dizziness onset while walking about 1 hour ago. He states that this dizziness has partially and almost fully resolved. He describes his dizziness as feeling "wobbly" and "off-balance". He states that he has been diagnosed with vertigo in the past, and states that the current dizziness feels similar. He denies chest pain, arm or leg weakness, confusion or any other symptoms, slurred speech, facial asymmetry.  PCP- Dr. Sinda Du   Past Medical History  Diagnosis Date  . Hypertension   . Cancer     Prostate  . Prostate cancer 11/03/2010    dx 2006  . Diabetes mellitus without complication    Past Surgical History  Procedure Laterality Date  . Prostate surgery     Family History  Problem Relation Age of Onset  . Stroke Father    History  Substance Use Topics  . Smoking status: Never Smoker   . Smokeless tobacco: Never Used  . Alcohol Use: No    Review of Systems A complete 10 system review of systems was obtained and all systems are negative except as noted in the HPI and PMH.   Allergies  Review of patient's allergies indicates no known allergies.  Home Medications   Current Outpatient Rx  Name  Route  Sig  Dispense  Refill  . amLODipine (NORVASC) 10 MG tablet   Oral   Take 10 mg by mouth.          . COMBIGAN 0.2-0.5 % ophthalmic solution      1 drop every 12 (twelve) hours.          Marland Kitchen lisinopril (PRINIVIL,ZESTRIL) 10 MG tablet   Oral   Take 10 mg by mouth  daily.          . metFORMIN (GLUCOPHAGE) 500 MG tablet   Oral   Take 500 mg by mouth 2 (two) times daily with a meal.          . Multiple Vitamins-Minerals (CENTRUM SILVER PO)   Oral   Take 1 capsule by mouth daily.           . pantoprazole (PROTONIX) 40 MG tablet   Oral   Take 1 tablet by mouth daily.          Triage Vitals: BP 168/78  Pulse 73  Temp(Src) 97.9 F (36.6 C) (Oral)  Resp 16  SpO2 100%  Physical Exam  Nursing note and vitals reviewed. Constitutional: He is oriented to person, place, and time. He appears well-developed and well-nourished.  HENT:  Head: Normocephalic and atraumatic.  Eyes: Conjunctivae and EOM are normal. Pupils are equal, round, and reactive to light.  Neck: Normal range of motion. Neck supple.  Cardiovascular: Normal rate, regular rhythm and normal heart sounds.   Pulmonary/Chest: Effort normal and breath sounds normal.  Abdominal: Soft. Bowel sounds are normal.  Musculoskeletal: Normal range of motion.  Neurological: He is alert and oriented to person, place, and time.  Skin: Skin is warm and dry.  Psychiatric: He has a normal mood and affect. His behavior is normal.    ED Course  Procedures (including critical care time)  DIAGNOSTIC STUDIES: Oxygen Saturation is 100% on RA, normal by my interpretation.    COORDINATION OF CARE: 3:32 PM- Discussed clinical suspicion that pt has vertigo, as he has had in the past. Advised pt of plan to see if he is able to walk, and to observe him for a while in the ED. Will also obtain diagnostic lab work. Pt advised of plan for treatment and pt agrees.  4:12 PM- Nursing staff reported that when pt's walking was tested, he was wobbling towards the right. Will order Meclizine, vertigo is still suspected.   Labs Review Labs Reviewed  GLUCOSE, CAPILLARY - Abnormal; Notable for the following:    Glucose-Capillary 113 (*)    All other components within normal limits  CBC WITH DIFFERENTIAL -  Abnormal; Notable for the following:    HCT 35.4 (*)    MCHC 37.0 (*)    All other components within normal limits  BASIC METABOLIC PANEL - Abnormal; Notable for the following:    Glucose, Bld 121 (*)    Creatinine, Ser 1.58 (*)    GFR calc non Af Amer 41 (*)    GFR calc Af Amer 47 (*)    All other components within normal limits   Imaging Review No results found.  EKG Interpretation    Date/Time:  Wednesday May 12 2013 14:21:38 EST Ventricular Rate:  71 PR Interval:  194 QRS Duration: 114 QT Interval:  384 QTC Calculation: 417 R Axis:   -27 Text Interpretation:  Normal sinus rhythm Left ventricular hypertrophy with repolarization abnormality Abnormal ECG No previous ECGs available Confirmed by Denitra Donaghey  MD, Darsha Zumstein ([id number]) on 05/12/2013 4:38:06 PM            MDM  No diagnosis found. Patient has no obvious neurological deficits.   His gait is slightly ataxic.  He is comfortable going home with his family. Rx Antivert 25 mg when necessary.  Patient has primary care followup   I personally performed the services described in this documentation, which was scribed in my presence. The recorded information has been reviewed and is accurate.    Nat Christen, MD 05/12/13 1705

## 2013-05-12 NOTE — ED Notes (Signed)
Patient with no complaints at this time. Respirations even and unlabored. Skin warm/dry. Discharge instructions reviewed with patient at this time. Patient given opportunity to voice concerns/ask questions. IV removed per policy and band-aid applied to site. Patient discharged at this time and left Emergency Department with steady gait.  

## 2013-05-12 NOTE — ED Notes (Signed)
Ambulated pt in hallway; pt was only able to take a few steps, than stumbled to his right side; returned for safety reasons; pt states he is feeling "wobbly"

## 2013-05-12 NOTE — ED Notes (Signed)
MD make aware of Ambulation trial. Verbal order for Meclizine 25 mg PO obtained.

## 2013-07-19 ENCOUNTER — Other Ambulatory Visit (HOSPITAL_COMMUNITY): Payer: Medicare Other

## 2013-07-21 ENCOUNTER — Encounter (HOSPITAL_COMMUNITY): Payer: Medicare Other | Attending: Hematology and Oncology

## 2013-07-21 DIAGNOSIS — E119 Type 2 diabetes mellitus without complications: Secondary | ICD-10-CM | POA: Insufficient documentation

## 2013-07-21 DIAGNOSIS — Z9079 Acquired absence of other genital organ(s): Secondary | ICD-10-CM | POA: Insufficient documentation

## 2013-07-21 DIAGNOSIS — C61 Malignant neoplasm of prostate: Secondary | ICD-10-CM

## 2013-07-21 DIAGNOSIS — I10 Essential (primary) hypertension: Secondary | ICD-10-CM | POA: Insufficient documentation

## 2013-07-21 DIAGNOSIS — Z923 Personal history of irradiation: Secondary | ICD-10-CM | POA: Insufficient documentation

## 2013-07-21 DIAGNOSIS — Z8546 Personal history of malignant neoplasm of prostate: Secondary | ICD-10-CM | POA: Insufficient documentation

## 2013-07-21 DIAGNOSIS — N529 Male erectile dysfunction, unspecified: Secondary | ICD-10-CM | POA: Insufficient documentation

## 2013-07-21 DIAGNOSIS — Z09 Encounter for follow-up examination after completed treatment for conditions other than malignant neoplasm: Secondary | ICD-10-CM | POA: Insufficient documentation

## 2013-07-21 LAB — CBC WITH DIFFERENTIAL/PLATELET
Basophils Absolute: 0 10*3/uL (ref 0.0–0.1)
Basophils Relative: 1 % (ref 0–1)
EOS ABS: 0.2 10*3/uL (ref 0.0–0.7)
EOS PCT: 4 % (ref 0–5)
HCT: 34.6 % — ABNORMAL LOW (ref 39.0–52.0)
HEMOGLOBIN: 12.1 g/dL — AB (ref 13.0–17.0)
LYMPHS ABS: 0.8 10*3/uL (ref 0.7–4.0)
Lymphocytes Relative: 19 % (ref 12–46)
MCH: 28 pg (ref 26.0–34.0)
MCHC: 35 g/dL (ref 30.0–36.0)
MCV: 80.1 fL (ref 78.0–100.0)
MONOS PCT: 6 % (ref 3–12)
Monocytes Absolute: 0.2 10*3/uL (ref 0.1–1.0)
Neutro Abs: 2.9 10*3/uL (ref 1.7–7.7)
Neutrophils Relative %: 70 % (ref 43–77)
Platelets: 178 10*3/uL (ref 150–400)
RBC: 4.32 MIL/uL (ref 4.22–5.81)
RDW: 12.8 % (ref 11.5–15.5)
WBC: 4.2 10*3/uL (ref 4.0–10.5)

## 2013-07-21 LAB — COMPREHENSIVE METABOLIC PANEL
ALT: 16 U/L (ref 0–53)
AST: 19 U/L (ref 0–37)
Albumin: 3.5 g/dL (ref 3.5–5.2)
Alkaline Phosphatase: 60 U/L (ref 39–117)
BUN: 20 mg/dL (ref 6–23)
CALCIUM: 9.1 mg/dL (ref 8.4–10.5)
CO2: 29 mEq/L (ref 19–32)
Chloride: 103 mEq/L (ref 96–112)
Creatinine, Ser: 1.62 mg/dL — ABNORMAL HIGH (ref 0.50–1.35)
GFR calc non Af Amer: 39 mL/min — ABNORMAL LOW (ref >90)
GFR, EST AFRICAN AMERICAN: 46 mL/min — AB (ref >90)
GLUCOSE: 188 mg/dL — AB (ref 70–99)
Potassium: 4.3 mEq/L (ref 3.7–5.3)
Sodium: 141 mEq/L (ref 137–147)
TOTAL PROTEIN: 7.3 g/dL (ref 6.0–8.3)
Total Bilirubin: 0.3 mg/dL (ref 0.3–1.2)

## 2013-07-21 NOTE — Progress Notes (Signed)
Labs drawn today for cbc/diff,cmp,psa

## 2013-07-22 LAB — PSA

## 2013-07-23 ENCOUNTER — Other Ambulatory Visit (HOSPITAL_COMMUNITY): Payer: Medicare Other

## 2013-07-26 ENCOUNTER — Encounter (HOSPITAL_BASED_OUTPATIENT_CLINIC_OR_DEPARTMENT_OTHER): Payer: Medicare Other

## 2013-07-26 ENCOUNTER — Encounter (HOSPITAL_COMMUNITY): Payer: Self-pay

## 2013-07-26 VITALS — BP 153/78 | HR 82 | Temp 97.6°F | Resp 18 | Wt 178.3 lb

## 2013-07-26 DIAGNOSIS — C61 Malignant neoplasm of prostate: Secondary | ICD-10-CM

## 2013-07-26 NOTE — Progress Notes (Signed)
[facility]  OFFICE PROGRESS NOTE  [name], MD [address] [address] [place] [address]  DIAGNOSIS: No diagnosis found.  Chief Complaint  Patient presents with  . Prostate Cancer    CURRENT THERAPY: Watchful expectation and surveillance. Followup of partial the prostate, status post radical prostatectomy in March of 2006 followed by radiotherapy for local recurrence ending in May of 2012. He is not receiving LHRH agonist therapy nor has he undergone orchiectomy.  INTERVAL HISTORY: Richard Swanson 79 y.o. male returns for followup of recurrent prostate cancer, status post local radiotherapy ending in May of 2012, on no active treatment at this time including androgen ablation.  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 He plans to retire within the next year. Appetite is good with no nausea, vomiting, fever, night sweats, breast pain, urinary hesitancy, incontinence, erectile dysfunction, diarrhea, constipation, melena, hematochezia, hematuria, lower extremity swelling or redness, chest pain, PND, orthopnea, or palpitations.  MEDICAL HISTORY: Past Medical History  Diagnosis Date  . Hypertension   . Cancer     Prostate  . Prostate cancer 11/03/2010    dx 2006  . Diabetes mellitus without complication     INTERIM HISTORY: has DYSPEPSIA; CHEST PAIN, DULL; and Prostate cancer on his problem list.   Gleason's score 5 prostate cancer. S/p radiation therapy by Dr. Valere Dross on 08/01/10- 09/14/10 and s/p radical prostatectomy in March 2006 by Dr. Michela Pitcher. Local recurrence treated with external beam radiotherapy ending in May of 2012.  ALLERGIES:  has No Known Allergies.  MEDICATIONS: has a current medication list which includes the following prescription(s): amlodipine, combigan, lisinopril, meclizine, metformin, multiple vitamins-minerals, and pantoprazole.  SURGICAL HISTORY:  Past Surgical History    Procedure Laterality Date  . Prostate surgery      FAMILY HISTORY: family history includes Stroke in his father.  SOCIAL HISTORY:  reports that he has never smoked. He has never used smokeless tobacco. He reports that he does not drink alcohol or use illicit drugs.  REVIEW OF SYSTEMS:  Other than that discussed above is noncontributory.  PHYSICAL EXAMINATION: ECOG PERFORMANCE STATUS: 0 - Asymptomatic  There were no vitals taken for this visit.  GENERAL:alert, no distress and comfortable SKIN: skin color, texture, turgor are normal, no rashes or significant lesions EYES: PERLA; Conjunctiva are pink and non-injected, sclera clear SINUSES: No redness or tenderness over maxillary or ethmoid sinuses OROPHARYNX:no exudate, no erythema on lips, buccal mucosa, or tongue. NECK: supple, thyroid normal size, non-tender, without nodularity. No masses CHEST: Normal AP diameter with no gynecomastia. LYMPH:  no palpable lymphadenopathy in the cervical, axillary or inguinal LUNGS: clear to auscultation and percussion with normal breathing effort HEART: regular rate & rhythm and no murmurs. ABDOMEN:abdomen soft, non-tender and normal bowel sounds MUSCULOSKELETAL:no cyanosis of digits and no clubbing. Range of motion normal.  NEURO: alert & oriented x 3 with fluent speech, no focal motor/sensory deficits   LABORATORY DATA: Infusion on 07/21/2013  Component Date Value Ref Range Status  . WBC 07/21/2013 4.2  4.0 - 10.5 K/uL Final  . RBC 07/21/2013 4.32  4.22 - 5.81 MIL/uL Final  . Hemoglobin 07/21/2013 12.1* 13.0 - 17.0 g/dL Final  . HCT 07/21/2013 34.6* 39.0 - 52.0 % Final  . MCV 07/21/2013 80.1  78.0 - 100.0 fL Final  . MCH 07/21/2013 28.0  26.0 - 34.0 pg Final  . MCHC 07/21/2013 35.0  30.0 -  36.0 g/dL Final  . RDW 07/21/2013 12.8  11.5 - 15.5 % Final  . Platelets 07/21/2013 178  150 - 400 K/uL Final  . Neutrophils Relative % 07/21/2013 70  43 - 77 % Final  . Neutro Abs 07/21/2013 2.9  1.7  - 7.7 K/uL Final  . Lymphocytes Relative 07/21/2013 19  12 - 46 % Final  . Lymphs Abs 07/21/2013 0.8  0.7 - 4.0 K/uL Final  . Monocytes Relative 07/21/2013 6  3 - 12 % Final  . Monocytes Absolute 07/21/2013 0.2  0.1 - 1.0 K/uL Final  . Eosinophils Relative 07/21/2013 4  0 - 5 % Final  . Eosinophils Absolute 07/21/2013 0.2  0.0 - 0.7 K/uL Final  . Basophils Relative 07/21/2013 1  0 - 1 % Final  . Basophils Absolute 07/21/2013 0.0  0.0 - 0.1 K/uL Final  . Sodium 07/21/2013 141  137 - 147 mEq/L Final  . Potassium 07/21/2013 4.3  3.7 - 5.3 mEq/L Final  . Chloride 07/21/2013 103  96 - 112 mEq/L Final  . CO2 07/21/2013 29  19 - 32 mEq/L Final  . Glucose, Bld 07/21/2013 188* 70 - 99 mg/dL Final  . BUN 07/21/2013 20  6 - 23 mg/dL Final  . Creatinine, Ser 07/21/2013 1.62* 0.50 - 1.35 mg/dL Final  . Calcium 07/21/2013 9.1  8.4 - 10.5 mg/dL Final  . Total Protein 07/21/2013 7.3  6.0 - 8.3 g/dL Final  . Albumin 07/21/2013 3.5  3.5 - 5.2 g/dL Final  . AST 07/21/2013 19  0 - 37 U/L Final  . ALT 07/21/2013 16  0 - 53 U/L Final  . Alkaline Phosphatase 07/21/2013 60  39 - 117 U/L Final  . Total Bilirubin 07/21/2013 0.3  0.3 - 1.2 mg/dL Final  . GFR calc non Af Amer 07/21/2013 39* >90 mL/min Final  . GFR calc Af Amer 07/21/2013 46* >90 mL/min Final   Comment: (NOTE)                          The eGFR has been calculated using the CKD EPI equation.                          This calculation has not been validated in all clinical situations.                          eGFR's persistently <90 mL/min signify possible Chronic Kidney                          Disease.  Marland Kitchen PSA 07/21/2013 <0.01* <=4.00 ng/mL Final   Comment: (NOTE)                          Result repeated and verified.                          Test Methodology: ECLIA PSA (Electrochemiluminescence Immunoassay)                          For PSA values from 2.5-4.0, particularly in younger men <60 years                          old, the AUA and NCCN  suggest  testing for % Free PSA (3515) and                          evaluation of the rate of increase in PSA (PSA velocity).                          Performed at [facility]: No new pathology. Original tumor was Gleason's 5  Urinalysis No results found for this basename: colorurine, appearanceur, labspec, phurine, glucoseu, hgbur, bilirubinur, ketonesur, proteinur, urobilinogen, nitrite, leukocytesur    RADIOGRAPHIC STUDIES: No results found.  ASSESSMENT:  1. Prostate cancer, S/P radiation therapy by Dr. Valere Dross on 08/01/10- 09/14/10 and s/p radical prostatectomy in March 2006 by Dr. Michela Pitcher., Normal PSA. 2. Erectile dysfunction, using a penile pump prior to intercourse.  3. Frequent nightly urination, resolved with Metformin  4. Recently diagnosed with diabetes, treated by PCP. On metformin 500 mg BID.   PLAN:  #1. Continue watchful expectation and surveillance. #2. Followup in 6 months with CBC, chem profile, PSA, and testosterone level.   All questions were answered. The patient knows to call the clinic with any problems, questions or concerns. We can certainly see the patient much sooner if necessary.   I spent 25 minutes counseling the patient face to face. The total time spent in the appointment was 30 minutes.    Doroteo Bradford, MD 07/26/2013 10:57 AM

## 2013-07-26 NOTE — Patient Instructions (Signed)
[facility] Discharge Instructions  RECOMMENDATIONS MADE BY THE CONSULTANT AND ANY TEST RESULTS WILL BE SENT TO YOUR REFERRING PHYSICIAN.  EXAM FINDINGS BY THE PHYSICIAN TODAY AND SIGNS OR SYMPTOMS TO REPORT TO CLINIC OR PRIMARY PHYSICIAN: Exam and findings as discussed by Dr. Barnet Glasgow.  You are doing well.  Report any unexplained weight loss, bone pain or other concerns.  MEDICATIONS PRESCRIBED:  none  INSTRUCTIONS/FOLLOW-UP: Follow-up in 6 months with labs and office visit.  Thank you for choosing [facility] to provide your oncology and hematology care.  To afford each patient quality time with our providers, please arrive at least 15 minutes before your scheduled appointment time.  With your help, our goal is to use those 15 minutes to complete the necessary work-up to ensure our physicians have the information they need to help with your evaluation and healthcare recommendations.    Effective January 1st, 2014, we ask that you re-schedule your appointment with our physicians should you arrive 10 or more minutes late for your appointment.  We strive to give you quality time with our providers, and arriving late affects you and other patients whose appointments are after yours.    Again, thank you for choosing [facility]  Our hope is that these requests will decrease the amount of time that you wait before being seen by our physicians.       _____________________________________________________________  Should you have questions after your visit to [facility], please contact our office at [phone] between the hours of 8:30 a.m. and 5:00 p.m.  Voicemails left after 4:30 p.m. will not be returned until the following business day.  For prescription refill requests, have your pharmacy contact our office with your prescription refill request.

## 2013-08-05 ENCOUNTER — Ambulatory Visit (INDEPENDENT_AMBULATORY_CARE_PROVIDER_SITE_OTHER): Payer: Medicare Other | Admitting: Otolaryngology

## 2013-08-05 DIAGNOSIS — H8110 Benign paroxysmal vertigo, unspecified ear: Secondary | ICD-10-CM

## 2014-01-21 ENCOUNTER — Encounter (HOSPITAL_COMMUNITY): Payer: Medicare Other | Attending: Hematology and Oncology

## 2014-01-21 DIAGNOSIS — I10 Essential (primary) hypertension: Secondary | ICD-10-CM | POA: Diagnosis not present

## 2014-01-21 DIAGNOSIS — C61 Malignant neoplasm of prostate: Secondary | ICD-10-CM | POA: Diagnosis not present

## 2014-01-21 LAB — CBC WITH DIFFERENTIAL/PLATELET
BASOS PCT: 1 % (ref 0–1)
Basophils Absolute: 0 10*3/uL (ref 0.0–0.1)
EOS ABS: 0.1 10*3/uL (ref 0.0–0.7)
EOS PCT: 3 % (ref 0–5)
HCT: 36.2 % — ABNORMAL LOW (ref 39.0–52.0)
HEMOGLOBIN: 12.8 g/dL — AB (ref 13.0–17.0)
Lymphocytes Relative: 23 % (ref 12–46)
Lymphs Abs: 1.1 10*3/uL (ref 0.7–4.0)
MCH: 28.1 pg (ref 26.0–34.0)
MCHC: 35.4 g/dL (ref 30.0–36.0)
MCV: 79.4 fL (ref 78.0–100.0)
Monocytes Absolute: 0.2 10*3/uL (ref 0.1–1.0)
Monocytes Relative: 5 % (ref 3–12)
NEUTROS PCT: 68 % (ref 43–77)
Neutro Abs: 3.4 10*3/uL (ref 1.7–7.7)
Platelets: 142 10*3/uL — ABNORMAL LOW (ref 150–400)
RBC: 4.56 MIL/uL (ref 4.22–5.81)
RDW: 13.2 % (ref 11.5–15.5)
WBC: 4.8 10*3/uL (ref 4.0–10.5)

## 2014-01-21 LAB — COMPREHENSIVE METABOLIC PANEL
ALK PHOS: 56 U/L (ref 39–117)
ALT: 13 U/L (ref 0–53)
AST: 16 U/L (ref 0–37)
Albumin: 3.9 g/dL (ref 3.5–5.2)
Anion gap: 9 (ref 5–15)
BUN: 30 mg/dL — ABNORMAL HIGH (ref 6–23)
CO2: 29 mEq/L (ref 19–32)
Calcium: 9.3 mg/dL (ref 8.4–10.5)
Chloride: 105 mEq/L (ref 96–112)
Creatinine, Ser: 1.69 mg/dL — ABNORMAL HIGH (ref 0.50–1.35)
GFR calc non Af Amer: 37 mL/min — ABNORMAL LOW (ref >90)
GFR, EST AFRICAN AMERICAN: 43 mL/min — AB (ref >90)
GLUCOSE: 133 mg/dL — AB (ref 70–99)
Potassium: 4.4 mEq/L (ref 3.7–5.3)
SODIUM: 143 meq/L (ref 137–147)
TOTAL PROTEIN: 7.7 g/dL (ref 6.0–8.3)
Total Bilirubin: 0.4 mg/dL (ref 0.3–1.2)

## 2014-01-21 NOTE — Progress Notes (Signed)
Labs for ftest,psa,cbcd,cmp

## 2014-01-22 LAB — PSA: PSA: <0.01 ng/mL — ABNORMAL LOW (ref <=4.00)

## 2014-01-24 ENCOUNTER — Encounter (HOSPITAL_BASED_OUTPATIENT_CLINIC_OR_DEPARTMENT_OTHER): Payer: Medicare Other

## 2014-01-24 ENCOUNTER — Encounter (HOSPITAL_COMMUNITY): Payer: Self-pay

## 2014-01-24 VITALS — BP 127/62 | HR 55 | Temp 98.6°F | Resp 18 | Wt 178.0 lb

## 2014-01-24 DIAGNOSIS — Z8546 Personal history of malignant neoplasm of prostate: Secondary | ICD-10-CM

## 2014-01-24 DIAGNOSIS — C61 Malignant neoplasm of prostate: Secondary | ICD-10-CM

## 2014-01-24 LAB — SEX HORMONE BINDING GLOBULIN: SEX HORMONE BINDING: 45 nmol/L (ref 13–71)

## 2014-01-24 LAB — TESTOSTERONE, % FREE: TESTOSTERONE-% FREE: 1.7 % — AB (ref 1.6–2.9)

## 2014-01-24 LAB — TESTOSTERONE, FREE: Testosterone, Free: 76.6 pg/mL (ref 47.0–244.0)

## 2014-01-24 NOTE — Progress Notes (Signed)
[place]  OFFICE PROGRESS NOTE  [name], MD [place] [address]  DIAGNOSIS: Prostate cancer - Plan: CBC with Differential, Comprehensive metabolic panel, Testosterone, free, PSA  Chief Complaint  Patient presents with  . Prostate Cancer    CURRENT THERAPY: Watchful expectation and surveillance, followup of carcinoma of the prostate, status post radical prostatectomy in March 2006 followed by radiotherapy for local recurrence ending in May of 2012, not receiving [facility] agonist therapy nor has he undergone orchiectomy.  INTERVAL HISTORY: Richard Swanson 79 y.o. male returns for followup of recurrent prostate cancer, status post local radiotherapy ending in May of 2012, on no active treatment at this time including androgen ablation. He retired from [employer] at the end of August 2015. Appetite has been excellent with no fever, night sweats, or hot flashes. He remains impotent. He denies any PND, orthopnea, palpitations, cough, wheezing, urinary hesitancy, dysuria, nocturia, incontinence, lower extremity swelling or redness, joint pain, skin rash, headache, or seizures.   MEDICAL HISTORY: Past Medical History  Diagnosis Date  . Hypertension   . Cancer     Prostate  . Prostate cancer 11/03/2010    dx 2006  . Diabetes mellitus without complication   . Dizziness     INTERIM HISTORY: has DYSPEPSIA; CHEST PAIN, DULL; and Prostate cancer on his problem list.    ALLERGIES:  has No Known Allergies.  MEDICATIONS: has a current medication list which includes the following prescription(s): amlodipine, combigan, lisinopril, metformin, multiple vitamins-minerals, meclizine, and pantoprazole.  SURGICAL HISTORY:  Past Surgical History  Procedure Laterality Date  . Prostate surgery      FAMILY HISTORY: family history includes Stroke in his father.  SOCIAL HISTORY:  reports that he has never smoked. He has  never used smokeless tobacco. He reports that he does not drink alcohol or use illicit drugs.  REVIEW OF SYSTEMS:  Other than that discussed above is noncontributory.  PHYSICAL EXAMINATION: ECOG PERFORMANCE STATUS: 0 - Asymptomatic  Blood pressure 127/62, pulse 55, temperature 98.6 F (37 C), temperature source Oral, resp. rate 18, weight 178 lb (80.74 kg), SpO2 100.00%.  GENERAL:alert, no distress and comfortable SKIN: skin color, texture, turgor are normal, no rashes or significant lesions EYES: PERLA; Conjunctiva are pink and non-injected, sclera clear SINUSES: No redness or tenderness over maxillary or ethmoid sinuses OROPHARYNX:no exudate, no erythema on lips, buccal mucosa, or tongue. NECK: supple, thyroid normal size, non-tender, without nodularity. No masses CHEST: Normal AP diameter with no gynecomastia. LYMPH:  no palpable lymphadenopathy in the cervical, axillary or inguinal LUNGS: clear to auscultation and percussion with normal breathing effort HEART: regular rate & rhythm and no murmurs. ABDOMEN:abdomen soft, non-tender and normal bowel sounds MUSCULOSKELETAL:no cyanosis of digits and no clubbing. Range of motion normal.  NEURO: alert & oriented x 3 with fluent speech, no focal motor/sensory deficits   LABORATORY DATA: Lab on 01/21/2014  Component Date Value Ref Range Status  . WBC 01/21/2014 4.8  4.0 - 10.5 K/uL Final  . RBC 01/21/2014 4.56  4.22 - 5.81 MIL/uL Final  . Hemoglobin 01/21/2014 12.8* 13.0 - 17.0 g/dL Final  . HCT 01/21/2014 36.2* 39.0 - 52.0 % Final  . MCV 01/21/2014 79.4  78.0 - 100.0 fL Final  . MCH 01/21/2014 28.1  26.0 - 34.0 pg Final  . MCHC 01/21/2014 35.4  30.0 - 36.0 g/dL Final  . RDW 01/21/2014 13.2  11.5 - 15.5 % Final  .  Platelets 01/21/2014 142* 150 - 400 K/uL Final  . Neutrophils Relative % 01/21/2014 68  43 - 77 % Final  . Lymphocytes Relative 01/21/2014 23  12 - 46 % Final  . Monocytes Relative 01/21/2014 5  3 - 12 % Final  .  Eosinophils Relative 01/21/2014 3  0 - 5 % Final  . Basophils Relative 01/21/2014 1  0 - 1 % Final  . Neutro Abs 01/21/2014 3.4  1.7 - 7.7 K/uL Final  . Lymphs Abs 01/21/2014 1.1  0.7 - 4.0 K/uL Final  . Monocytes Absolute 01/21/2014 0.2  0.1 - 1.0 K/uL Final  . Eosinophils Absolute 01/21/2014 0.1  0.0 - 0.7 K/uL Final  . Basophils Absolute 01/21/2014 0.0  0.0 - 0.1 K/uL Final  . Smear Review 01/21/2014 PLATELET COUNT CONFIRMED BY SMEAR   Final   SPECIMEN CHECKED FOR CLOTS  . Sodium 01/21/2014 143  137 - 147 mEq/L Final  . Potassium 01/21/2014 4.4  3.7 - 5.3 mEq/L Final  . Chloride 01/21/2014 105  96 - 112 mEq/L Final  . CO2 01/21/2014 29  19 - 32 mEq/L Final  . Glucose, Bld 01/21/2014 133* 70 - 99 mg/dL Final  . BUN 01/21/2014 30* 6 - 23 mg/dL Final  . Creatinine, Ser 01/21/2014 1.69* 0.50 - 1.35 mg/dL Final  . Calcium 01/21/2014 9.3  8.4 - 10.5 mg/dL Final  . Total Protein 01/21/2014 7.7  6.0 - 8.3 g/dL Final  . Albumin 01/21/2014 3.9  3.5 - 5.2 g/dL Final  . AST 01/21/2014 16  0 - 37 U/L Final  . ALT 01/21/2014 13  0 - 53 U/L Final  . Alkaline Phosphatase 01/21/2014 56  39 - 117 U/L Final  . Total Bilirubin 01/21/2014 0.4  0.3 - 1.2 mg/dL Final  . GFR calc non Af Amer 01/21/2014 37* >90 mL/min Final  . GFR calc Af Amer 01/21/2014 43* >90 mL/min Final   Comment: (NOTE)                          The eGFR has been calculated using the CKD EPI equation.                          This calculation has not been validated in all clinical situations.                          eGFR's persistently <90 mL/min signify possible Chronic Kidney                          Disease.  . Anion gap 01/21/2014 9  5 - 15 Final  . Testosterone, Free 01/21/2014 76.6  47.0 - 244.0 pg/mL Final   Comment: (NOTE)                          The concentration of free testosterone is derived from a mathematical                          expression based on constants for the binding of testosterone to sex                           hormone-binding globulin and albumin.  Performed at [facility]  . PSA 01/21/2014 <0.01* <=4.00 ng/mL Final   Comment: (NOTE)                          Result repeated and verified.                          Test Methodology: ECLIA PSA (Electrochemiluminescence Immunoassay)                          For PSA values from 2.5-4.0, particularly in younger men <60 years                          old, the AUA and NCCN suggest testing for % Free PSA (3515) and                          evaluation of the rate of increase in PSA (PSA velocity).                          Performed at [facility]  . Testosterone-% Free 01/21/2014 1.7* 1.6 - 2.9 % Final   Performed at [facility]  . Sex Hormone Binding 01/21/2014 45  13 - 71 nmol/L Final   Performed at [facility]: No new pathology.  Urinalysis No results found for this basename: colorurine,  appearanceur,  labspec,  phurine,  glucoseu,  hgbur,  bilirubinur,  ketonesur,  proteinur,  urobilinogen,  nitrite,  leukocytesur    RADIOGRAPHIC STUDIES: No results found.  ASSESSMENT:  1. Prostate cancer, S/P radiation therapy by Dr. Valere Dross on 08/01/10- 09/14/10 and s/p radical prostatectomy in March 2006 by Dr. Michela Pitcher., Normal PSA.  2. Erectile dysfunction, using a penile pump prior to intercourse.  3. Frequent nightly urination, resolved with Metformin  4. Recently diagnosed with diabetes, treated by PCP. On metformin 500 mg BID.       PLAN:  #1. Continue watchful expectation and surveillance. #2. Followup in 6 months with CBC, chem profile, PSA, and testosterone level.   All questions were answered. The patient knows to call the clinic with any problems, questions or concerns. We can certainly see the patient much sooner if necessary.   I spent 25 minutes counseling the patient face to face. The total time spent in the appointment was 30 minutes.    Doroteo Bradford, MD 01/24/2014 2:19 PM  DISCLAIMER:  This note was dictated with voice recognition software.  Similar sounding words can inadvertently be transcribed inaccurately and may not be corrected upon review.

## 2014-01-24 NOTE — Patient Instructions (Signed)
[facility] Discharge Instructions  RECOMMENDATIONS MADE BY THE CONSULTANT AND ANY TEST RESULTS WILL BE SENT TO YOUR REFERRING PHYSICIAN.  Followup in 6 months with CBC, chem profile, PSA, and testosterone level  Thank you for choosing [facility] to provide your oncology and hematology care.  To afford each patient quality time with our providers, please arrive at least 15 minutes before your scheduled appointment time.  With your help, our goal is to use those 15 minutes to complete the necessary work-up to ensure our physicians have the information they need to help with your evaluation and healthcare recommendations.    Effective January 1st, 2014, we ask that you re-schedule your appointment with our physicians should you arrive 10 or more minutes late for your appointment.  We strive to give you quality time with our providers, and arriving late affects you and other patients whose appointments are after yours.    Again, thank you for choosing [facility]  Our hope is that these requests will decrease the amount of time that you wait before being seen by our physicians.       _____________________________________________________________  Should you have questions after your visit to [facility], please contact our office at [phone] between the hours of 8:30 a.m. and 4:30 p.m.  Voicemails left after 4:30 p.m. will not be returned until the following business day.  For prescription refill requests, have your pharmacy contact our office with your prescription refill request.    _______________________________________________________________  We hope that we have given you very good care.  You may receive a patient satisfaction survey in the mail, please complete it and return it as soon as possible.  We value your feedback!  _______________________________________________________________  Have you asked about our STAR  program?  STAR stands for Survivorship Training and Rehabilitation, and this is a nationally recognized cancer care program that focuses on survivorship and rehabilitation.  Cancer and cancer treatments may cause problems, such as, pain, making you feel tired and keeping you from doing the things that you need or want to do. Cancer rehabilitation can help. Our goal is to reduce these troubling effects and help you have the best quality of life possible.  You may receive a survey from a nurse that asks questions about your current state of health.  Based on the survey results, all eligible patients will be referred to the [facility] program for an evaluation so we can better serve you!  A frequently asked questions sheet is available upon request.

## 2014-02-11 ENCOUNTER — Other Ambulatory Visit: Payer: Self-pay

## 2014-07-20 ENCOUNTER — Other Ambulatory Visit (HOSPITAL_COMMUNITY): Payer: Self-pay

## 2014-07-20 DIAGNOSIS — C61 Malignant neoplasm of prostate: Secondary | ICD-10-CM

## 2014-07-25 ENCOUNTER — Encounter (HOSPITAL_COMMUNITY): Payer: Medicare Other | Attending: Hematology & Oncology | Admitting: Hematology & Oncology

## 2014-07-25 ENCOUNTER — Encounter (HOSPITAL_BASED_OUTPATIENT_CLINIC_OR_DEPARTMENT_OTHER): Payer: Medicare Other

## 2014-07-25 ENCOUNTER — Encounter (HOSPITAL_COMMUNITY): Payer: Self-pay | Admitting: Lab

## 2014-07-25 ENCOUNTER — Encounter (HOSPITAL_COMMUNITY): Payer: Self-pay | Admitting: Hematology & Oncology

## 2014-07-25 VITALS — BP 136/68 | HR 65 | Temp 98.2°F | Resp 18 | Wt 181.8 lb

## 2014-07-25 DIAGNOSIS — C61 Malignant neoplasm of prostate: Secondary | ICD-10-CM

## 2014-07-25 DIAGNOSIS — D631 Anemia in chronic kidney disease: Secondary | ICD-10-CM

## 2014-07-25 DIAGNOSIS — N189 Chronic kidney disease, unspecified: Secondary | ICD-10-CM | POA: Diagnosis not present

## 2014-07-25 DIAGNOSIS — H9319 Tinnitus, unspecified ear: Secondary | ICD-10-CM | POA: Diagnosis not present

## 2014-07-25 LAB — COMPREHENSIVE METABOLIC PANEL
ALT: 25 U/L (ref 0–53)
AST: 25 U/L (ref 0–37)
Albumin: 4.1 g/dL (ref 3.5–5.2)
Alkaline Phosphatase: 54 U/L (ref 39–117)
Anion gap: 8 (ref 5–15)
BUN: 19 mg/dL (ref 6–23)
CHLORIDE: 102 mmol/L (ref 96–112)
CO2: 27 mmol/L (ref 19–32)
Calcium: 9.2 mg/dL (ref 8.4–10.5)
Creatinine, Ser: 1.66 mg/dL — ABNORMAL HIGH (ref 0.50–1.35)
GFR calc non Af Amer: 38 mL/min — ABNORMAL LOW (ref >90)
GFR, EST AFRICAN AMERICAN: 44 mL/min — AB (ref >90)
Glucose, Bld: 194 mg/dL — ABNORMAL HIGH (ref 70–99)
POTASSIUM: 4.4 mmol/L (ref 3.5–5.1)
SODIUM: 137 mmol/L (ref 135–145)
Total Bilirubin: 0.7 mg/dL (ref 0.3–1.2)
Total Protein: 7.5 g/dL (ref 6.0–8.3)

## 2014-07-25 LAB — CBC WITH DIFFERENTIAL/PLATELET
BASOS PCT: 1 % (ref 0–1)
Basophils Absolute: 0 10*3/uL (ref 0.0–0.1)
EOS ABS: 0.2 10*3/uL (ref 0.0–0.7)
EOS PCT: 3 % (ref 0–5)
HCT: 35.2 % — ABNORMAL LOW (ref 39.0–52.0)
Hemoglobin: 12.6 g/dL — ABNORMAL LOW (ref 13.0–17.0)
LYMPHS ABS: 1.1 10*3/uL (ref 0.7–4.0)
Lymphocytes Relative: 18 % (ref 12–46)
MCH: 28.3 pg (ref 26.0–34.0)
MCHC: 35.8 g/dL (ref 30.0–36.0)
MCV: 78.9 fL (ref 78.0–100.0)
MONOS PCT: 5 % (ref 3–12)
Monocytes Absolute: 0.3 10*3/uL (ref 0.1–1.0)
Neutro Abs: 4.3 10*3/uL (ref 1.7–7.7)
Neutrophils Relative %: 73 % (ref 43–77)
Platelets: 178 10*3/uL (ref 150–400)
RBC: 4.46 MIL/uL (ref 4.22–5.81)
RDW: 13 % (ref 11.5–15.5)
WBC: 5.8 10*3/uL (ref 4.0–10.5)

## 2014-07-25 NOTE — Progress Notes (Signed)
Labs drawn

## 2014-07-25 NOTE — Progress Notes (Signed)
Referral sent to Dr Benjamine Mola.  Referral faxed on 3/28

## 2014-07-25 NOTE — Progress Notes (Signed)
Richard Bogus, MD [place] [address]    DIAGNOSIS: Prostate Cancer             Radical prostatectomy in March 2006             Local recurrence XRT ending in May 2012  CURRENT THERAPY: Observation  INTERVAL HISTORY: Richard Swanson 80 y.o. male returns for follow-up of prostate cancer. He underwent a radical prostatectomy in 2006 and had salvage radiotherapy in 2012. His PSAs have been consistently less than 0.01. He has developed new tendinitis in the left ear. He states has been present for over 3 weeks. He denies headaches, blurry vision, or change in memory. He describes it as very annoying and uncomfortable.   His appetite and energy level are good. He has no other new complaints.  MEDICAL HISTORY: Past Medical History  Diagnosis Date  . Hypertension   . Cancer     Prostate  . Prostate cancer 11/03/2010    dx 2006  . Diabetes mellitus without complication   . Dizziness     has DYSPEPSIA; CHEST PAIN, DULL; and Prostate cancer on his problem list.     has No Known Allergies.  Richard Swanson does not currently have medications on file.  SURGICAL HISTORY: Past Surgical History  Procedure Laterality Date  . Prostate surgery      SOCIAL HISTORY: History   Social History  . Marital Status: Married    Spouse Name: N/A  . Number of Children: N/A  . Years of Education: N/A   Occupational History  . Not on file.   Social History Main Topics  . Smoking status: Never Smoker   . Smokeless tobacco: Never Used  . Alcohol Use: No  . Drug Use: No  . Sexual Activity: Not on file   Other Topics Concern  . Not on file   Social History Narrative    FAMILY HISTORY: Family History  Problem Relation Age of Onset  . Stroke Father     Review of Systems  Constitutional: Negative for fever, chills, weight loss and malaise/fatigue.  HENT: Positive for tinnitus. Negative for congestion, hearing loss, nosebleeds and sore throat.     Eyes: Negative for blurred vision, double vision, pain and discharge.  Respiratory: Negative for cough, hemoptysis, sputum production, shortness of breath and wheezing.   Cardiovascular: Negative for chest pain, palpitations, claudication, leg swelling and PND.  Gastrointestinal: Negative for heartburn, nausea, vomiting, abdominal pain, diarrhea, constipation, blood in stool and melena.  Genitourinary: Negative for dysuria, urgency, frequency and hematuria.  Musculoskeletal: Negative for myalgias, joint pain and falls.  Skin: Negative for itching and rash.  Neurological: Negative for dizziness, tingling, tremors, sensory change, speech change, focal weakness, seizures, loss of consciousness, weakness and headaches.  Endo/Heme/Allergies: Does not bruise/bleed easily.  Psychiatric/Behavioral: Negative for depression, suicidal ideas, memory loss and substance abuse. The patient is not nervous/anxious and does not have insomnia.     PHYSICAL EXAMINATION  ECOG PERFORMANCE STATUS: 0 - Asymptomatic  Filed Vitals:   07/25/14 1137  BP: 136/68  Pulse: 65  Temp: 98.2 F (36.8 C)  Resp: 18    Physical Exam  Constitutional: He is oriented to person, place, and time and well-developed, well-nourished, and in no distress.  HENT:  Head: Normocephalic and atraumatic.  Nose: Nose normal.  Mouth/Throat: Oropharynx is clear and moist. No oropharyngeal exudate.  TM's visualized bilaterally and clear  Eyes: Conjunctivae and EOM are normal. Pupils are  equal, round, and reactive to light. Right eye exhibits no discharge. Left eye exhibits no discharge. No scleral icterus.  Neck: Normal range of motion. Neck supple. No tracheal deviation present. No thyromegaly present.  Cardiovascular: Normal rate, regular rhythm and normal heart sounds.  Exam reveals no gallop and no friction rub.   No murmur heard. Pulmonary/Chest: Effort normal and breath sounds normal. He has no wheezes. He has no rales.   Abdominal: Soft. Bowel sounds are normal. He exhibits no distension and no mass. There is no tenderness. There is no rebound and no guarding.  Musculoskeletal: Normal range of motion. He exhibits no edema.  Lymphadenopathy:    He has no cervical adenopathy.  Neurological: He is alert and oriented to person, place, and time. He has normal reflexes. No cranial nerve deficit. Gait normal. Coordination normal.  Skin: Skin is warm and dry. No rash noted.  Psychiatric: Mood, memory, affect and judgment normal.  Nursing note and vitals reviewed.   LABORATORY DATA:  CBC    Component Value Date/Time   WBC 4.8 01/21/2014 1041   RBC 4.56 01/21/2014 1041   HGB 12.8* 01/21/2014 1041   HCT 36.2* 01/21/2014 1041   PLT 142* 01/21/2014 1041   MCV 79.4 01/21/2014 1041   MCH 28.1 01/21/2014 1041   MCHC 35.4 01/21/2014 1041   RDW 13.2 01/21/2014 1041   LYMPHSABS 1.1 01/21/2014 1041   MONOABS 0.2 01/21/2014 1041   EOSABS 0.1 01/21/2014 1041   BASOSABS 0.0 01/21/2014 1041   CMP     Component Value Date/Time   NA 143 01/21/2014 1041   K 4.4 01/21/2014 1041   CL 105 01/21/2014 1041   CO2 29 01/21/2014 1041   GLUCOSE 133* 01/21/2014 1041   BUN 30* 01/21/2014 1041   CREATININE 1.69* 01/21/2014 1041   CALCIUM 9.3 01/21/2014 1041   PROT 7.7 01/21/2014 1041   ALBUMIN 3.9 01/21/2014 1041   AST 16 01/21/2014 1041   ALT 13 01/21/2014 1041   ALKPHOS 56 01/21/2014 1041   BILITOT 0.4 01/21/2014 1041   GFRNONAA 37* 01/21/2014 1041   GFRAA 43* 01/21/2014 1041      ASSESSMENT and THERAPY PLAN:   Prostate cancer  D7-year-old male with a history of prostate cancer originally treated with prostatectomy. He had to undergo salvage radiation in 2012. Thus far he has had no evidence of recurrence. I advised him we will call him when his PSA is available. We will schedule him for a six-month ongoing follow-up visit.  Anemia Chronic kidney disease  Has a very mild anemia and I suspect at this point  it is secondary to his chronic kidney disease. His counts have been stable for some time and I currently feel no need to do an additional evaluation. We will continue with observation.  Tinnitus His TMs were visualized bilaterally and clear. I have recommended referring him to ENT for additional evaluation of his tinnitus.  All questions were answered. The patient knows to call the clinic with any problems, questions or concerns. We can certainly see the patient much sooner if necessary. This note was electronically signed. Molli Hazard 07/25/2014

## 2014-07-25 NOTE — Patient Instructions (Signed)
[place] at [facility] Discharge Instructions  RECOMMENDATIONS MADE BY THE CONSULTANT AND ANY TEST RESULTS WILL BE SENT TO YOUR REFERRING PHYSICIAN.  Exam and discussion by Dr. Whitney Muse. Will make a referral to Ear, Nose and Throat doctor for your ears.  Call with any concerns or issues. Follow-up in 6 months with labs and office visit.  Thank you for choosing [place] at [facility] to provide your oncology and hematology care.  To afford each patient quality time with our provider, please arrive at least 15 minutes before your scheduled appointment time.    You need to re-schedule your appointment should you arrive 10 or more minutes late.  We strive to give you quality time with our providers, and arriving late affects you and other patients whose appointments are after yours.  Also, if you no show three or more times for appointments you may be dismissed from the clinic at the providers discretion.     Again, thank you for choosing [facility].  Our hope is that these requests will decrease the amount of time that you wait before being seen by our physicians.       _____________________________________________________________  Should you have questions after your visit to [facility], please contact our office at [phone] between the hours of 8:30 a.m. and 4:30 p.m.  Voicemails left after 4:30 p.m. will not be returned until the following business day.  For prescription refill requests, have your pharmacy contact our office.

## 2014-07-26 LAB — PSA: PSA: <0.01 ng/mL (ref <=4.00)

## 2014-07-26 LAB — TESTOSTERONE: Testosterone: 304 ng/dL (ref 300–890)

## 2014-08-05 DIAGNOSIS — H9312 Tinnitus, left ear: Secondary | ICD-10-CM | POA: Diagnosis not present

## 2014-08-05 DIAGNOSIS — H903 Sensorineural hearing loss, bilateral: Secondary | ICD-10-CM | POA: Diagnosis not present

## 2014-09-29 DIAGNOSIS — H4011X2 Primary open-angle glaucoma, moderate stage: Secondary | ICD-10-CM | POA: Diagnosis not present

## 2014-09-29 DIAGNOSIS — H26493 Other secondary cataract, bilateral: Secondary | ICD-10-CM | POA: Diagnosis not present

## 2014-10-24 ENCOUNTER — Other Ambulatory Visit: Payer: Self-pay

## 2015-01-25 ENCOUNTER — Other Ambulatory Visit (HOSPITAL_COMMUNITY): Payer: Medicare Other

## 2015-01-25 ENCOUNTER — Ambulatory Visit (HOSPITAL_COMMUNITY): Payer: Medicare Other | Admitting: Hematology & Oncology

## 2015-01-30 ENCOUNTER — Encounter (HOSPITAL_BASED_OUTPATIENT_CLINIC_OR_DEPARTMENT_OTHER): Payer: Medicare Other

## 2015-01-30 ENCOUNTER — Encounter (HOSPITAL_COMMUNITY): Payer: Self-pay | Admitting: Hematology & Oncology

## 2015-01-30 ENCOUNTER — Encounter (HOSPITAL_COMMUNITY): Payer: Medicare Other | Attending: Hematology & Oncology | Admitting: Hematology & Oncology

## 2015-01-30 VITALS — BP 129/64 | HR 60 | Temp 98.4°F | Resp 18 | Wt 185.0 lb

## 2015-01-30 DIAGNOSIS — N189 Chronic kidney disease, unspecified: Secondary | ICD-10-CM

## 2015-01-30 DIAGNOSIS — H9319 Tinnitus, unspecified ear: Secondary | ICD-10-CM

## 2015-01-30 DIAGNOSIS — D649 Anemia, unspecified: Secondary | ICD-10-CM

## 2015-01-30 DIAGNOSIS — C61 Malignant neoplasm of prostate: Secondary | ICD-10-CM | POA: Diagnosis not present

## 2015-01-30 LAB — CBC WITH DIFFERENTIAL/PLATELET
Basophils Absolute: 0 10*3/uL (ref 0.0–0.1)
Basophils Relative: 0 %
EOS PCT: 4 %
Eosinophils Absolute: 0.2 10*3/uL (ref 0.0–0.7)
HCT: 33.7 % — ABNORMAL LOW (ref 39.0–52.0)
Hemoglobin: 11.9 g/dL — ABNORMAL LOW (ref 13.0–17.0)
Lymphocytes Relative: 22 %
Lymphs Abs: 0.9 10*3/uL (ref 0.7–4.0)
MCH: 27.9 pg (ref 26.0–34.0)
MCHC: 35.3 g/dL (ref 30.0–36.0)
MCV: 78.9 fL (ref 78.0–100.0)
MONO ABS: 0.2 10*3/uL (ref 0.1–1.0)
Monocytes Relative: 6 %
Neutro Abs: 3 10*3/uL (ref 1.7–7.7)
Neutrophils Relative %: 68 %
Platelets: 157 10*3/uL (ref 150–400)
RBC: 4.27 MIL/uL (ref 4.22–5.81)
RDW: 13.3 % (ref 11.5–15.5)
WBC: 4.3 10*3/uL (ref 4.0–10.5)

## 2015-01-30 LAB — COMPREHENSIVE METABOLIC PANEL
ALT: 20 U/L (ref 17–63)
AST: 23 U/L (ref 15–41)
Albumin: 4 g/dL (ref 3.5–5.0)
Alkaline Phosphatase: 45 U/L (ref 38–126)
Anion gap: 5 (ref 5–15)
BUN: 21 mg/dL — ABNORMAL HIGH (ref 6–20)
CO2: 28 mmol/L (ref 22–32)
Calcium: 9.1 mg/dL (ref 8.9–10.3)
Chloride: 108 mmol/L (ref 101–111)
Creatinine, Ser: 1.58 mg/dL — ABNORMAL HIGH (ref 0.61–1.24)
GFR calc Af Amer: 47 mL/min — ABNORMAL LOW (ref >60)
GFR, EST NON AFRICAN AMERICAN: 40 mL/min — AB (ref >60)
Glucose, Bld: 109 mg/dL — ABNORMAL HIGH (ref 65–99)
Potassium: 4.5 mmol/L (ref 3.5–5.1)
Sodium: 141 mmol/L (ref 135–145)
Total Bilirubin: 0.6 mg/dL (ref 0.3–1.2)
Total Protein: 7.1 g/dL (ref 6.5–8.1)

## 2015-01-30 LAB — PSA: PSA: 0.01 ng/mL (ref 0.00–4.00)

## 2015-01-30 NOTE — Progress Notes (Signed)
Lab draw

## 2015-01-30 NOTE — Progress Notes (Signed)
Richard Bogus, MD [place] [address]  DIAGNOSIS: Prostate Cancer             Radical prostatectomy in March 2006             Local recurrence XRT ending in May 2012  CURRENT THERAPY: Observation  INTERVAL HISTORY: Richard Swanson 80 y.o. male returns for follow-up of prostate cancer. He underwent a radical prostatectomy in 2006 and had salvage radiotherapy in 2012. His PSAs have been consistently less than 0.01.  Richard Swanson is here alone today.   He says that last time he had a ringing in his left ear (tinnitus), which he's still experiencing. He says it's bearable now, but he was recently told there was nothing that could be done about it.  He says he gets up and urinates twice per night. He says he gets about five hours of sleep per night. With regards to this, he comments "I do not drink enough water, I really don't."  Richard Swanson says he goes to the [facility] three times a week and walks on the treadmill.  He says he wants to live as long as he can. He says "I know I'm blessed, I realize that."  He says his appetite is good. He's 185 lbs now, and remarks "I'm going in the wrong direction."  He denies any new lumps and bumps. He says he used to have acid reflux, but that's gone now. He says "I don't really have any complaints."  He denies any belly pain, and any new joint pain. He denies any new and unusual changes, but he does jokingly say: "I mean, some parts of me are broke."  He says "I'm fine, I can't complain. I'm still here at 74, and I feel like I'm blessed."  He inquired after how often he should be receiving a physical with his PCP.   MEDICAL HISTORY: Past Medical History  Diagnosis Date  . Hypertension   . Cancer [facility])     Prostate  . Prostate cancer ([place]) 11/03/2010    dx 2006  . Diabetes mellitus without complication ([place])   . Dizziness     has DYSPEPSIA; CHEST PAIN, DULL; and Prostate cancer ([place]) on his problem list.      has No Known Allergies.  Richard Swanson does not currently have medications on file.  SURGICAL HISTORY: Past Surgical History  Procedure Laterality Date  . Prostate surgery      SOCIAL HISTORY: Social History   Social History  . Marital Status: Married    Spouse Name: N/A  . Number of Children: N/A  . Years of Education: N/A   Occupational History  . Not on file.   Social History Main Topics  . Smoking status: Never Smoker   . Smokeless tobacco: Never Used  . Alcohol Use: No  . Drug Use: No  . Sexual Activity: Not on file   Other Topics Concern  . Not on file   Social History Narrative    FAMILY HISTORY: Family History  Problem Relation Age of Onset  . Stroke Father    Dad lived to 9 Mother lived to 9 His sister is 22  Review of Systems  Constitutional: Negative for fever, chills, weight loss and malaise/fatigue.  HENT: Positive for tinnitus. Negative for congestion, hearing loss, nosebleeds and sore throat.   Eyes: Negative for blurred vision, double vision, pain and discharge.  Respiratory: Negative for cough, hemoptysis, sputum production, shortness  of breath and wheezing.   Cardiovascular: Negative for chest pain, palpitations, claudication, leg swelling and PND.  Gastrointestinal: Negative for heartburn, nausea, vomiting, abdominal pain, diarrhea, constipation, blood in stool and melena.  Genitourinary: Negative for dysuria, urgency, frequency and hematuria.  Musculoskeletal: Negative for myalgias, joint pain and falls.  Skin: Negative for itching and rash.  Neurological: Negative for dizziness, tingling, tremors, sensory change, speech change, focal weakness, seizures, loss of consciousness, weakness and headaches.  Endo/Heme/Allergies: Does not bruise/bleed easily.  Psychiatric/Behavioral: Negative for depression, suicidal ideas, memory loss and substance abuse. The patient is not nervous/anxious and does not have insomnia.    14 point review of  systems was performed and is negative except as detailed under history of present illness and above   PHYSICAL EXAMINATION  ECOG PERFORMANCE STATUS: 0 - Asymptomatic  Filed Vitals:   01/30/15 1346  BP: 129/64  Pulse: 60  Temp: 98.4 F (36.9 C)  Resp: 18    Physical Exam  Constitutional: He is oriented to person, place, and time and well-developed, well-nourished, and in no distress.  HENT:  Head: Normocephalic and atraumatic.  Nose: Nose normal.  Mouth/Throat: Oropharynx is clear and moist. No oropharyngeal exudate.  Eyes: Conjunctivae and EOM are normal. Pupils are equal, round, and reactive to light. Right eye exhibits no discharge. Left eye exhibits no discharge. No scleral icterus.  Neck: Normal range of motion. Neck supple. No tracheal deviation present. No thyromegaly present.  Cardiovascular: Normal rate, regular rhythm and normal heart sounds.  Exam reveals no gallop and no friction rub.   No murmur heard. Pulmonary/Chest: Effort normal and breath sounds normal. He has no wheezes. He has no rales.  Abdominal: Soft. Bowel sounds are normal. He exhibits no distension and no mass. There is no tenderness. There is no rebound and no guarding.  Musculoskeletal: Normal range of motion. He exhibits no edema.  Lymphadenopathy:    He has no cervical adenopathy.  Neurological: He is alert and oriented to person, place, and time. He has normal reflexes. No cranial nerve deficit. Gait normal. Coordination normal.  Skin: Skin is warm and dry. No rash noted.  Psychiatric: Mood, memory, affect and judgment normal.  Nursing note and vitals reviewed.   LABORATORY DATA: I have reviewed the data as listed.  CBC    Component Value Date/Time   WBC 4.3 01/30/2015 1313   RBC 4.27 01/30/2015 1313   HGB 11.9* 01/30/2015 1313   HCT 33.7* 01/30/2015 1313   PLT 157 01/30/2015 1313   MCV 78.9 01/30/2015 1313   MCH 27.9 01/30/2015 1313   MCHC 35.3 01/30/2015 1313   RDW 13.3 01/30/2015  1313   LYMPHSABS 0.9 01/30/2015 1313   MONOABS 0.2 01/30/2015 1313   EOSABS 0.2 01/30/2015 1313   BASOSABS 0.0 01/30/2015 1313   CMP     Component Value Date/Time   NA 141 01/30/2015 1313   K 4.5 01/30/2015 1313   CL 108 01/30/2015 1313   CO2 28 01/30/2015 1313   GLUCOSE 109* 01/30/2015 1313   BUN 21* 01/30/2015 1313   CREATININE 1.58* 01/30/2015 1313   CALCIUM 9.1 01/30/2015 1313   PROT 7.1 01/30/2015 1313   ALBUMIN 4.0 01/30/2015 1313   AST 23 01/30/2015 1313   ALT 20 01/30/2015 1313   ALKPHOS 45 01/30/2015 1313   BILITOT 0.6 01/30/2015 1313   GFRNONAA 40* 01/30/2015 1313   GFRAA 47* 01/30/2015 1313    ASSESSMENT and THERAPY PLAN:   Prostate cancer  80 year old male with  a history of prostate cancer originally treated with prostatectomy. He had to undergo salvage radiation in 2012. Thus far he has had no evidence of recurrence. I advised him we will call him when his PSA is available. We will schedule him for a six-month ongoing follow-up visit.  Anemia Chronic kidney disease  Has a very mild anemia and I suspect at this point it is secondary to his chronic kidney disease. His counts have been stable for some time and I currently feel no need to do an additional evaluation. We will continue with observation.  Tinnitus He has seen Dr. Benjamine Mola and notes that he was advised that nothing could be done. For now his symptoms are "tolerable." He was advised to let us know if this worsens.  We will see him back in six months.  All questions were answered. The patient knows to call the clinic with any problems, questions or concerns. We can certainly see the patient much sooner if necessary.  This document serves as a record of services personally performed by Ancil Linsey, MD. It was created on her behalf by Toni Amend, a trained medical scribe. The creation of this record is based on the scribe's personal observations and the provider's statements to them. This  document has been checked and approved by the attending provider.  I have reviewed the above documentation for accuracy and completeness, and I agree with the above.  This note was electronically signed. Molli Hazard, MD  01/30/2015

## 2015-01-30 NOTE — Patient Instructions (Signed)
..  [place] at [facility] Discharge Instructions  RECOMMENDATIONS MADE BY THE CONSULTANT AND ANY TEST RESULTS WILL BE SENT TO YOUR REFERRING PHYSICIAN.  Exam is good  Return in 6 months for labs and Dr. visit  Thank you for choosing [place] at [facility] to provide your oncology and hematology care.  To afford each patient quality time with our provider, please arrive at least 15 minutes before your scheduled appointment time.    You need to re-schedule your appointment should you arrive 10 or more minutes late.  We strive to give you quality time with our providers, and arriving late affects you and other patients whose appointments are after yours.  Also, if you no show three or more times for appointments you may be dismissed from the clinic at the providers discretion.     Again, thank you for choosing [facility].  Our hope is that these requests will decrease the amount of time that you wait before being seen by our physicians.       _____________________________________________________________  Should you have questions after your visit to [facility], please contact our office at [phone] between the hours of 8:30 a.m. and 4:30 p.m.  Voicemails left after 4:30 p.m. will not be returned until the following business day.  For prescription refill requests, have your pharmacy contact our office.

## 2015-01-31 DIAGNOSIS — H401132 Primary open-angle glaucoma, bilateral, moderate stage: Secondary | ICD-10-CM | POA: Diagnosis not present

## 2015-01-31 DIAGNOSIS — H26493 Other secondary cataract, bilateral: Secondary | ICD-10-CM | POA: Diagnosis not present

## 2015-06-14 DIAGNOSIS — E119 Type 2 diabetes mellitus without complications: Secondary | ICD-10-CM | POA: Diagnosis not present

## 2015-06-14 DIAGNOSIS — H401132 Primary open-angle glaucoma, bilateral, moderate stage: Secondary | ICD-10-CM | POA: Diagnosis not present

## 2015-06-14 DIAGNOSIS — H26493 Other secondary cataract, bilateral: Secondary | ICD-10-CM | POA: Diagnosis not present

## 2015-07-31 ENCOUNTER — Encounter (HOSPITAL_COMMUNITY): Payer: Self-pay | Admitting: Hematology & Oncology

## 2015-07-31 ENCOUNTER — Encounter (HOSPITAL_COMMUNITY): Payer: Medicare Other

## 2015-07-31 ENCOUNTER — Encounter (HOSPITAL_COMMUNITY): Payer: Medicare Other | Attending: Hematology & Oncology | Admitting: Hematology & Oncology

## 2015-07-31 VITALS — BP 126/70 | HR 76 | Temp 98.3°F | Resp 18 | Wt 191.9 lb

## 2015-07-31 DIAGNOSIS — C61 Malignant neoplasm of prostate: Secondary | ICD-10-CM

## 2015-07-31 DIAGNOSIS — I10 Essential (primary) hypertension: Secondary | ICD-10-CM | POA: Diagnosis not present

## 2015-07-31 DIAGNOSIS — R350 Frequency of micturition: Secondary | ICD-10-CM

## 2015-07-31 DIAGNOSIS — D631 Anemia in chronic kidney disease: Secondary | ICD-10-CM | POA: Diagnosis not present

## 2015-07-31 DIAGNOSIS — Z823 Family history of stroke: Secondary | ICD-10-CM | POA: Diagnosis not present

## 2015-07-31 DIAGNOSIS — R1013 Epigastric pain: Secondary | ICD-10-CM | POA: Diagnosis not present

## 2015-07-31 DIAGNOSIS — E119 Type 2 diabetes mellitus without complications: Secondary | ICD-10-CM | POA: Insufficient documentation

## 2015-07-31 DIAGNOSIS — I129 Hypertensive chronic kidney disease with stage 1 through stage 4 chronic kidney disease, or unspecified chronic kidney disease: Secondary | ICD-10-CM | POA: Diagnosis not present

## 2015-07-31 DIAGNOSIS — N189 Chronic kidney disease, unspecified: Secondary | ICD-10-CM

## 2015-07-31 DIAGNOSIS — E1121 Type 2 diabetes mellitus with diabetic nephropathy: Secondary | ICD-10-CM | POA: Diagnosis not present

## 2015-07-31 DIAGNOSIS — R351 Nocturia: Secondary | ICD-10-CM

## 2015-07-31 LAB — CBC WITH DIFFERENTIAL/PLATELET
Basophils Absolute: 0 10*3/uL (ref 0.0–0.1)
Basophils Relative: 1 %
Eosinophils Absolute: 0.2 10*3/uL (ref 0.0–0.7)
Eosinophils Relative: 4 %
HCT: 35.5 % — ABNORMAL LOW (ref 39.0–52.0)
Hemoglobin: 12.7 g/dL — ABNORMAL LOW (ref 13.0–17.0)
LYMPHS ABS: 1.1 10*3/uL (ref 0.7–4.0)
LYMPHS PCT: 22 %
MCH: 28 pg (ref 26.0–34.0)
MCHC: 35.8 g/dL (ref 30.0–36.0)
MCV: 78.4 fL (ref 78.0–100.0)
MONO ABS: 0.3 10*3/uL (ref 0.1–1.0)
Monocytes Relative: 7 %
Neutro Abs: 3.4 10*3/uL (ref 1.7–7.7)
Neutrophils Relative %: 66 %
PLATELETS: 166 10*3/uL (ref 150–400)
RBC: 4.53 MIL/uL (ref 4.22–5.81)
RDW: 13.2 % (ref 11.5–15.5)
WBC: 5.1 10*3/uL (ref 4.0–10.5)

## 2015-07-31 LAB — COMPREHENSIVE METABOLIC PANEL
ALT: 26 U/L (ref 17–63)
AST: 25 U/L (ref 15–41)
Albumin: 4.3 g/dL (ref 3.5–5.0)
Alkaline Phosphatase: 50 U/L (ref 38–126)
Anion gap: 6 (ref 5–15)
BUN: 23 mg/dL — ABNORMAL HIGH (ref 6–20)
CO2: 26 mmol/L (ref 22–32)
Calcium: 8.8 mg/dL — ABNORMAL LOW (ref 8.9–10.3)
Chloride: 105 mmol/L (ref 101–111)
Creatinine, Ser: 1.65 mg/dL — ABNORMAL HIGH (ref 0.61–1.24)
GFR, EST AFRICAN AMERICAN: 44 mL/min — AB (ref >60)
GFR, EST NON AFRICAN AMERICAN: 38 mL/min — AB (ref >60)
Glucose, Bld: 194 mg/dL — ABNORMAL HIGH (ref 65–99)
Potassium: 4.4 mmol/L (ref 3.5–5.1)
Sodium: 137 mmol/L (ref 135–145)
TOTAL PROTEIN: 7.5 g/dL (ref 6.5–8.1)
Total Bilirubin: 0.8 mg/dL (ref 0.3–1.2)

## 2015-07-31 LAB — PSA: PSA: 0.01 ng/mL (ref 0.00–4.00)

## 2015-07-31 NOTE — Progress Notes (Signed)
[name], MD [place] [address]  DIAGNOSIS: Prostate Cancer             Radical prostatectomy in March 2006             Local recurrence XRT ending in May 2012  CURRENT THERAPY: Observation  INTERVAL HISTORY: Richard Swanson 81 y.o. male returns for follow-up of prostate cancer. He underwent a radical prostatectomy in 2006 and had salvage radiotherapy in 2012. His PSAs have been consistently less than 0.01.  Richard Swanson is here alone. I personally reviewed and went over laboratory studies with the patient.  He has no complaints other than his sugar levels being elevated. He plans to see his PCP, Dr. Luan Pulling, for evaluation of his blood sugar. States that he fasted this morning. He drinks a pepsi nightly. He also notes he does not "want to give up his fried chicken."  He continues to experience frequency and nocturia.  Reports a cold spell a couple months ago. His blood sugars were elevated before then. [facility] however when he began to have the cough and cold symptoms he stopped going. He has not been motivated to go back to his previous exercise schedule since. He plans to begin exercising again soon.   Denies chest pain, although he does experience heartburn on occasion. He previously took a pill for acid reflux. Denies abdominal pain or blood in stool. He has a healthy appetite, "too good".   No other major complaints or concerns today. Overall feels well. Tinnitis is improved.   MEDICAL HISTORY: Past Medical History  Diagnosis Date  . Hypertension   . Cancer [facility]     Prostate  . Prostate cancer ([place]) 11/03/2010    dx 2006  . Diabetes mellitus without complication ([place])   . Dizziness     has DYSPEPSIA; CHEST PAIN, DULL; Prostate cancer ([place]); and Anemia in chronic kidney disease on his problem list.     has No Known Allergies.  Richard Swanson does not currently have medications on  file.  SURGICAL HISTORY: Past Surgical History  Procedure Laterality Date  . Prostate surgery      SOCIAL HISTORY: Social History   Social History  . Marital Status: Married    Spouse Name: N/A  . Number of Children: N/A  . Years of Education: N/A   Occupational History  . Not on file.   Social History Main Topics  . Smoking status: Never Smoker   . Smokeless tobacco: Never Used  . Alcohol Use: No  . Drug Use: No  . Sexual Activity: Not on file   Other Topics Concern  . Not on file   Social History Narrative    FAMILY HISTORY: Family History  Problem Relation Age of Onset  . Stroke Father    Dad lived to 24 Mother lived to 23 His sister is 49  Review of Systems  Constitutional: Negative for fever, chills, weight loss and malaise/fatigue.  HENT: Positive for tinnitus. Negative for congestion, hearing loss, nosebleeds and sore throat.   Eyes: Negative for blurred vision, double vision, pain and discharge.  Respiratory: Negative for cough, hemoptysis, sputum production, shortness of breath and wheezing.   Cardiovascular: Negative for chest pain, palpitations, claudication, leg swelling and PND.  Gastrointestinal: Positive for heartburn. Negative for nausea, vomiting, abdominal pain, diarrhea, constipation, blood in stool and melena.  Intermittent heartburn Genitourinary: Positive for  frequency and nocturia. Negative for dysuria, urgency, and hematuria.  Musculoskeletal: Negative for myalgias, joint pain and falls.  Skin: Negative for itching and rash.  Neurological: Negative for dizziness, tingling, tremors, sensory change, speech change, focal weakness, seizures, loss of consciousness, weakness and headaches.  Endo/Heme/Allergies: Does not bruise/bleed easily.  Psychiatric/Behavioral: Negative for depression, suicidal ideas, memory loss and substance abuse. The patient is not nervous/anxious and does not have insomnia.    14 point review of systems was performed  and is negative except as detailed under history of present illness and above   PHYSICAL EXAMINATION  ECOG PERFORMANCE STATUS: 0 - Asymptomatic  Filed Vitals:   07/31/15 1124  BP: 126/70  Pulse: 76  Temp: 98.3 F (36.8 C)  Resp: 18    Physical Exam  Constitutional: He is oriented to person, place, and time and well-developed, well-nourished, and in no distress. Wears glasses. HENT:  Head: Normocephalic and atraumatic.  Nose: Nose normal.  Mouth/Throat: Oropharynx is clear and moist. No oropharyngeal exudate.  Eyes: Conjunctivae and EOM are normal. Pupils are equal, round, and reactive to light. Right eye exhibits no discharge. Left eye exhibits no discharge. No scleral icterus.  Neck: Normal range of motion. Neck supple. No tracheal deviation present. No thyromegaly present.  Cardiovascular: Normal rate, regular rhythm and normal heart sounds.  Exam reveals no gallop and no friction rub.   No murmur heard. Pulmonary/Chest: Effort normal and breath sounds normal. He has no wheezes. He has no rales.  Abdominal: Soft. Bowel sounds are normal. He exhibits no distension and no mass. There is no tenderness. There is no rebound and no guarding.  Musculoskeletal: Normal range of motion. He exhibits no edema.  Lymphadenopathy:    He has no cervical adenopathy.  Neurological: He is alert and oriented to person, place, and time. He has normal reflexes. No cranial nerve deficit. Gait normal. Coordination normal.  Skin: Skin is warm and dry. No rash noted.  Psychiatric: Mood, memory, affect and judgment normal.  Nursing note and vitals reviewed.   LABORATORY DATA: I have reviewed the data as listed.  CBC    Component Value Date/Time   WBC 5.1 07/31/2015 1029   RBC 4.53 07/31/2015 1029   HGB 12.7* 07/31/2015 1029   HCT 35.5* 07/31/2015 1029   PLT 166 07/31/2015 1029   MCV 78.4 07/31/2015 1029   MCH 28.0 07/31/2015 1029   MCHC 35.8 07/31/2015 1029   RDW 13.2 07/31/2015 1029    LYMPHSABS 1.1 07/31/2015 1029   MONOABS 0.3 07/31/2015 1029   EOSABS 0.2 07/31/2015 1029   BASOSABS 0.0 07/31/2015 1029   CMP     Component Value Date/Time   NA 137 07/31/2015 1029   K 4.4 07/31/2015 1029   CL 105 07/31/2015 1029   CO2 26 07/31/2015 1029   GLUCOSE 194* 07/31/2015 1029   BUN 23* 07/31/2015 1029   CREATININE 1.65* 07/31/2015 1029   CALCIUM 8.8* 07/31/2015 1029   PROT 7.5 07/31/2015 1029   ALBUMIN 4.3 07/31/2015 1029   AST 25 07/31/2015 1029   ALT 26 07/31/2015 1029   ALKPHOS 50 07/31/2015 1029   BILITOT 0.8 07/31/2015 1029   GFRNONAA 38* 07/31/2015 1029   GFRAA 44* 07/31/2015 1029   Results for HAROL, MASONER (MRN [id number]) as of 07/31/2015 11:48  Ref. Range 01/21/2014 10:41 07/25/2014 11:29 01/30/2015 13:13 01/30/2015 15:19 07/31/2015 10:29  Glucose Latest Ref Range: 65-99 mg/dL 133 (H) 194 (H) 109 (H)  194 (H)    ASSESSMENT and THERAPY  PLAN:   Prostate cancer 81 year old male with prostate carcinoma s/p radical prostatectomy in 2006 and had salvage radiotherapy in 2012. Overall doing well. We will notify him of his PSA when available. Continue with 6 month follow-up.  Anemia in chronic kidney disease Anemia is stable. Ongoing observation is recommended.    All questions were answered. The patient knows to call the clinic with any problems, questions or concerns. We can certainly see the patient much sooner if necessary.  This document serves as a record of services personally performed by Ancil Linsey, MD. It was created on her behalf by Arlyce Harman, a trained medical scribe. The creation of this record is based on the scribe's personal observations and the provider's statements to them. This document has been checked and approved by the attending provider.  I have reviewed the above documentation for accuracy and completeness, and I agree with the above.  This note was electronically signed. Molli Hazard, MD  07/31/2015

## 2015-07-31 NOTE — Assessment & Plan Note (Signed)
81 year old male with prostate carcinoma s/p radical prostatectomy in 2006 and had salvage radiotherapy in 2012. Overall doing well. We will notify him of his PSA when available. Continue with 6 month follow-up.

## 2015-07-31 NOTE — Patient Instructions (Addendum)
[place] at [facility] Discharge Instructions  RECOMMENDATIONS MADE BY THE CONSULTANT AND ANY TEST RESULTS WILL BE SENT TO YOUR REFERRING PHYSICIAN.   Exam and discussion by Dr Whitney Muse today Labs are stable, PSA pending we will call you with those results  Return to see the doctor in 6 months with labs  Getting back in the gym will be good for you. Please call the clinic if you have any questions or concerns     Thank you for choosing [place] at [facility] to provide your oncology and hematology care.  To afford each patient quality time with our provider, please arrive at least 15 minutes before your scheduled appointment time.   Beginning January 23rd 2017 lab work for the [facility] will be done in the  Main lab at [facility] on 1st floor. If you have a lab appointment with the [facility] please come in thru the  Main Entrance and check in at the main information desk  You need to re-schedule your appointment should you arrive 10 or more minutes late.  We strive to give you quality time with our providers, and arriving late affects you and other patients whose appointments are after yours.  Also, if you no show three or more times for appointments you may be dismissed from the clinic at the providers discretion.     Again, thank you for choosing [facility].  Our hope is that these requests will decrease the amount of time that you wait before being seen by our physicians.       _____________________________________________________________  Should you have questions after your visit to [facility], please contact our office at [phone] between the hours of 8:30 a.m. and 4:30 p.m.  Voicemails left after 4:30 p.m. will not be returned until the following business day.  For prescription refill requests, have your pharmacy contact our office.         Resources For Cancer Patients and their  Caregivers ? American Cancer Society: Can assist with transportation, wigs, general needs, runs Look Good Feel Better.        440-086-8529 ? Cancer Care: Provides financial assistance, online support groups, medication/co-pay assistance.  1-800-813-HOPE 985 563 0769) ? [place] Assists [place] cancer patients and their families through emotional , educational and financial support.  415-468-4185 ? [place] DSS Where to apply for food stamps, Medicaid and utility assistance. 289-427-2943 ? [facility]: Transportation to medical appointments. 2140099103 ? Social Security Administration: May apply for disability if have a Stage IV cancer. 581-597-3649 269-371-5513 ? [employer], Disability and Transit Services: Assists with nutrition, care and transit needs. [phone]

## 2015-07-31 NOTE — Assessment & Plan Note (Signed)
Anemia is stable. Ongoing observation is recommended.

## 2015-08-01 DIAGNOSIS — C61 Malignant neoplasm of prostate: Secondary | ICD-10-CM | POA: Diagnosis not present

## 2015-08-01 DIAGNOSIS — I10 Essential (primary) hypertension: Secondary | ICD-10-CM | POA: Diagnosis not present

## 2015-08-01 DIAGNOSIS — I129 Hypertensive chronic kidney disease with stage 1 through stage 4 chronic kidney disease, or unspecified chronic kidney disease: Secondary | ICD-10-CM | POA: Diagnosis not present

## 2015-08-01 DIAGNOSIS — E1121 Type 2 diabetes mellitus with diabetic nephropathy: Secondary | ICD-10-CM | POA: Diagnosis not present

## 2015-10-18 DIAGNOSIS — H401132 Primary open-angle glaucoma, bilateral, moderate stage: Secondary | ICD-10-CM | POA: Diagnosis not present

## 2015-10-18 DIAGNOSIS — H26493 Other secondary cataract, bilateral: Secondary | ICD-10-CM | POA: Diagnosis not present

## 2015-10-18 DIAGNOSIS — Z961 Presence of intraocular lens: Secondary | ICD-10-CM | POA: Diagnosis not present

## 2015-11-13 DIAGNOSIS — Z0000 Encounter for general adult medical examination without abnormal findings: Secondary | ICD-10-CM | POA: Diagnosis not present

## 2015-11-14 ENCOUNTER — Encounter: Payer: Self-pay | Admitting: Family Medicine

## 2015-11-14 DIAGNOSIS — J449 Chronic obstructive pulmonary disease, unspecified: Secondary | ICD-10-CM | POA: Diagnosis not present

## 2015-11-14 DIAGNOSIS — C61 Malignant neoplasm of prostate: Secondary | ICD-10-CM | POA: Diagnosis not present

## 2015-11-14 DIAGNOSIS — I129 Hypertensive chronic kidney disease with stage 1 through stage 4 chronic kidney disease, or unspecified chronic kidney disease: Secondary | ICD-10-CM | POA: Diagnosis not present

## 2015-11-14 DIAGNOSIS — E1121 Type 2 diabetes mellitus with diabetic nephropathy: Secondary | ICD-10-CM | POA: Diagnosis not present

## 2015-11-14 DIAGNOSIS — N08 Glomerular disorders in diseases classified elsewhere: Secondary | ICD-10-CM | POA: Diagnosis not present

## 2015-11-16 DIAGNOSIS — Z1211 Encounter for screening for malignant neoplasm of colon: Secondary | ICD-10-CM | POA: Diagnosis not present

## 2016-01-30 ENCOUNTER — Encounter (HOSPITAL_COMMUNITY): Payer: Medicare Other

## 2016-01-30 ENCOUNTER — Encounter (HOSPITAL_COMMUNITY): Payer: Medicare Other | Attending: Oncology | Admitting: Oncology

## 2016-01-30 ENCOUNTER — Encounter (HOSPITAL_COMMUNITY): Payer: Self-pay | Admitting: Oncology

## 2016-01-30 VITALS — BP 136/74 | HR 72 | Temp 98.6°F | Resp 16 | Ht 72.0 in | Wt 185.0 lb

## 2016-01-30 DIAGNOSIS — Z23 Encounter for immunization: Secondary | ICD-10-CM | POA: Diagnosis not present

## 2016-01-30 DIAGNOSIS — C61 Malignant neoplasm of prostate: Secondary | ICD-10-CM

## 2016-01-30 LAB — COMPREHENSIVE METABOLIC PANEL
ALT: 25 U/L (ref 17–63)
AST: 25 U/L (ref 15–41)
Albumin: 4.4 g/dL (ref 3.5–5.0)
Alkaline Phosphatase: 49 U/L (ref 38–126)
Anion gap: 7 (ref 5–15)
BILIRUBIN TOTAL: 0.7 mg/dL (ref 0.3–1.2)
BUN: 22 mg/dL — AB (ref 6–20)
CO2: 26 mmol/L (ref 22–32)
CREATININE: 1.74 mg/dL — AB (ref 0.61–1.24)
Calcium: 9.4 mg/dL (ref 8.9–10.3)
Chloride: 105 mmol/L (ref 101–111)
GFR calc Af Amer: 41 mL/min — ABNORMAL LOW (ref >60)
GFR, EST NON AFRICAN AMERICAN: 36 mL/min — AB (ref >60)
Glucose, Bld: 132 mg/dL — ABNORMAL HIGH (ref 65–99)
Potassium: 4 mmol/L (ref 3.5–5.1)
Sodium: 138 mmol/L (ref 135–145)
TOTAL PROTEIN: 7.9 g/dL (ref 6.5–8.1)

## 2016-01-30 LAB — CBC WITH DIFFERENTIAL/PLATELET
BASOS ABS: 0 10*3/uL (ref 0.0–0.1)
Basophils Relative: 0 %
Eosinophils Absolute: 0.2 10*3/uL (ref 0.0–0.7)
Eosinophils Relative: 3 %
HEMATOCRIT: 37.3 % — AB (ref 39.0–52.0)
HEMOGLOBIN: 13.4 g/dL (ref 13.0–17.0)
LYMPHS PCT: 24 %
Lymphs Abs: 1.2 10*3/uL (ref 0.7–4.0)
MCH: 28.3 pg (ref 26.0–34.0)
MCHC: 35.9 g/dL (ref 30.0–36.0)
MCV: 78.7 fL (ref 78.0–100.0)
Monocytes Absolute: 0.3 10*3/uL (ref 0.1–1.0)
Monocytes Relative: 5 %
NEUTROS ABS: 3.5 10*3/uL (ref 1.7–7.7)
Neutrophils Relative %: 68 %
Platelets: 152 10*3/uL (ref 150–400)
RBC: 4.74 MIL/uL (ref 4.22–5.81)
RDW: 13.3 % (ref 11.5–15.5)
WBC: 5.1 10*3/uL (ref 4.0–10.5)

## 2016-01-30 LAB — PSA: PSA: 0.01 ng/mL (ref 0.00–4.00)

## 2016-01-30 MED ORDER — INFLUENZA VAC SPLIT QUAD 0.5 ML IM SUSY
0.5000 mL | PREFILLED_SYRINGE | Freq: Once | INTRAMUSCULAR | Status: AC
  Administered 2016-01-30: 0.5 mL via INTRAMUSCULAR
  Filled 2016-01-30: qty 0.5

## 2016-01-30 NOTE — Assessment & Plan Note (Addendum)
History of a Gleason's score 5 prostate cancer. S/p radiation therapy by Dr. Valere Dross on 08/01/10- 09/14/10 and s/p radical prostatectomy in March 2006 by Dr. Michela Pitcher.  Labs today: CBC diff, CMET, PSA.  I personally reviewed and went over laboratory results with the patient.  The results are noted within this dictation.  Labs in 6 months: CBC diff, CMET, PSA.  Influenza vaccine today.  Return in 6 months for follow-up.

## 2016-01-30 NOTE — Progress Notes (Signed)
Pt given flu shot in left deltoid. Pt tolerated well. Pt stable and discharged home ambulatory.  

## 2016-01-30 NOTE — Progress Notes (Signed)
Richard Bogus, MD [address]  [place] [address]  Prostate cancer [facility]) - Plan: CBC with Differential, Comprehensive metabolic panel, PSA  Need for prophylactic vaccination and inoculation against influenza - Plan: Influenza vac split quadrivalent PF (FLUARIX) injection 0.5 mL  CURRENT THERAPY: Observation  INTERVAL HISTORY: Richard Swanson 81 y.o. male returns for followup of History of a Gleason's score 5 prostate cancer. S/p radiation therapy by Dr. Valere Dross on 08/01/10- 09/14/10 and s/p radical prostatectomy in March 2006 by Dr. Michela Pitcher.  He is doing very well.  He denies any appetite changes.  He denies any new pains.  He denies any new fractures.   He is retired, but now volunteers as an Producer, television/film/video at [employer].  He remains an active community member.  Review of Systems  Constitutional: Negative.  Negative for chills, fever and weight loss.  HENT: Negative.   Eyes: Negative.   Respiratory: Negative.  Negative for cough.   Cardiovascular: Negative.  Negative for chest pain.  Gastrointestinal: Negative.  Negative for constipation, diarrhea, nausea and vomiting.  Genitourinary: Positive for frequency (night time frequency only). Negative for dysuria.  Musculoskeletal: Negative.  Negative for falls.  Skin: Negative.   Neurological: Negative.  Negative for weakness.  Endo/Heme/Allergies: Negative.   Psychiatric/Behavioral: Negative.     Past Medical History:  Diagnosis Date  . Cancer [facility])    Prostate  . Diabetes mellitus without complication ([place])   . Dizziness   . Hypertension   . Prostate cancer ([place]) 11/03/2010   dx 2006    Past Surgical History:  Procedure Laterality Date  . PROSTATE SURGERY      Family History  Problem Relation Age of Onset  . Stroke Father     Social History   Social History  . Marital status: Married    Spouse name: N/A  . Number of children: N/A  . Years of education: N/A   Social  History Main Topics  . Smoking status: Never Smoker  . Smokeless tobacco: Never Used  . Alcohol use No  . Drug use: No  . Sexual activity: Not Asked   Other Topics Concern  . None   Social History Narrative  . None     PHYSICAL EXAMINATION  ECOG PERFORMANCE STATUS: 0 - Asymptomatic  Vitals:   01/30/16 1018  BP: 136/74  Pulse: 72  Resp: 16  Temp: 98.6 F (37 C)    GENERAL:alert, no distress, well nourished, well developed, comfortable, cooperative, smiling and unaccompanied SKIN: skin color, texture, turgor are normal, no rashes or significant lesions HEAD: Normocephalic, No masses, lesions, tenderness or abnormalities EYES: normal, EOMI, Conjunctiva are pink and non-injected EARS: External ears normal OROPHARYNX:lips, buccal mucosa, and tongue normal and mucous membranes are moist  NECK: supple, trachea midline LYMPH:  no palpable lymphadenopathy BREAST:not examined LUNGS: clear to auscultation and percussion HEART: regular rate & rhythm, no murmurs, no gallops, S1 normal and S2 normal ABDOMEN:abdomen soft, non-tender and normal bowel sounds BACK: Back symmetric, no curvature. EXTREMITIES:less then 2 second capillary refill, no joint deformities, effusion, or inflammation, no skin discoloration, no cyanosis  NEURO: alert & oriented x 3 with fluent speech, no focal motor/sensory deficits, gait normal   LABORATORY DATA: CBC    Component Value Date/Time   WBC 5.1 01/30/2016 0931   RBC 4.74 01/30/2016 0931   HGB 13.4 01/30/2016 0931   HCT 37.3 (L) 01/30/2016 0931   PLT 152 01/30/2016 0931  MCV 78.7 01/30/2016 0931   MCH 28.3 01/30/2016 0931   MCHC 35.9 01/30/2016 0931   RDW 13.3 01/30/2016 0931   LYMPHSABS 1.2 01/30/2016 0931   MONOABS 0.3 01/30/2016 0931   EOSABS 0.2 01/30/2016 0931   BASOSABS 0.0 01/30/2016 0931      Chemistry      Component Value Date/Time   NA 138 01/30/2016 0931   K 4.0 01/30/2016 0931   CL 105 01/30/2016 0931   CO2 26 01/30/2016  0931   BUN 22 (H) 01/30/2016 0931   CREATININE 1.74 (H) 01/30/2016 0931      Component Value Date/Time   CALCIUM 9.4 01/30/2016 0931   ALKPHOS 49 01/30/2016 0931   AST 25 01/30/2016 0931   ALT 25 01/30/2016 0931   BILITOT 0.7 01/30/2016 0931     Lab Results  Component Value Date   PSA 0.01 07/31/2015   PSA 0.01 01/30/2015   PSA <0.01 07/25/2014     PENDING LABS:   RADIOGRAPHIC STUDIES:  No results found.   PATHOLOGY:    ASSESSMENT AND PLAN:  Prostate cancer ([place]) History of a Gleason's score 5 prostate cancer. S/p radiation therapy by Dr. Valere Dross on 08/01/10- 09/14/10 and s/p radical prostatectomy in March 2006 by Dr. Michela Pitcher.  Labs today: CBC diff, CMET, PSA.  I personally reviewed and went over laboratory results with the patient.  The results are noted within this dictation.  Labs in 6 months: CBC diff, CMET, PSA.  Influenza vaccine today.  Return in 6 months for follow-up.   ORDERS PLACED FOR THIS ENCOUNTER: Orders Placed This Encounter  Procedures  . CBC with Differential  . Comprehensive metabolic panel  . PSA    MEDICATIONS PRESCRIBED THIS ENCOUNTER: Meds ordered this encounter  Medications  . JANUMET 50-500 MG tablet  . Influenza vac split quadrivalent PF (FLUARIX) injection 0.5 mL    THERAPY PLAN:  Continue observation.  All questions were answered. The patient knows to call the clinic with any problems, questions or concerns. We can certainly see the patient much sooner if necessary.  Patient and plan discussed with Dr. Ancil Linsey and she is in agreement with the aforementioned.   This note is electronically signed by: Doy Mince 01/30/2016 10:51 AM

## 2016-01-30 NOTE — Patient Instructions (Signed)
[place] at [facility] Discharge Instructions  RECOMMENDATIONS MADE BY THE CONSULTANT AND ANY TEST RESULTS WILL BE SENT TO YOUR REFERRING PHYSICIAN.  You were seen today by Kirby Crigler PA-C. You were given your flu shot today. Return in 6 months for follow up and labs.   Thank you for choosing [place] at [facility] to provide your oncology and hematology care.  To afford each patient quality time with our provider, please arrive at least 15 minutes before your scheduled appointment time.   Beginning January 23rd 2017 lab work for the [facility] will be done in the  Main lab at [facility] on 1st floor. If you have a lab appointment with the [facility] please come in thru the  Main Entrance and check in at the main information desk  You need to re-schedule your appointment should you arrive 10 or more minutes late.  We strive to give you quality time with our providers, and arriving late affects you and other patients whose appointments are after yours.  Also, if you no show three or more times for appointments you may be dismissed from the clinic at the providers discretion.     Again, thank you for choosing [facility].  Our hope is that these requests will decrease the amount of time that you wait before being seen by our physicians.       _____________________________________________________________  Should you have questions after your visit to [facility], please contact our office at [phone] between the hours of 8:30 a.m. and 4:30 p.m.  Voicemails left after 4:30 p.m. will not be returned until the following business day.  For prescription refill requests, have your pharmacy contact our office.         Resources For Cancer Patients and their Caregivers ? American Cancer Society: Can assist with transportation, wigs, general needs, runs Look Good Feel Better.        276 224 0253 ? Cancer  Care: Provides financial assistance, online support groups, medication/co-pay assistance.  1-800-813-HOPE 806-594-1559) ? [facility] Assists [place] cancer patients and their families through emotional , educational and financial support.  845-833-5274 ? [place] DSS Where to apply for food stamps, Medicaid and utility assistance. 220-051-2988 ? RCATS: Transportation to medical appointments. (225)212-0356 ? Social Security Administration: May apply for disability if have a Stage IV cancer. 585-627-3274 (323)809-0810 ? [employer], Disability and Transit Services: Assists with nutrition, care and transit needs. [place] Support Programs: @10RELATIVEDAYS @ > Cancer Support Group  2nd Tuesday of the month 1pm-2pm, Journey Room  > Creative Journey  3rd Tuesday of the month 1130am-1pm, Journey Room  > Look Good Feel Better  1st Wednesday of the month 10am-12 noon, Journey Room (Call [place] [facility] to register [phone])

## 2016-07-30 ENCOUNTER — Encounter (HOSPITAL_COMMUNITY): Payer: Medicare Other | Attending: Oncology | Admitting: Oncology

## 2016-07-30 ENCOUNTER — Encounter (HOSPITAL_COMMUNITY): Payer: Medicare Other | Attending: Oncology

## 2016-07-30 ENCOUNTER — Encounter (HOSPITAL_COMMUNITY): Payer: Self-pay | Admitting: Oncology

## 2016-07-30 VITALS — BP 132/65 | HR 58 | Temp 97.8°F | Resp 16 | Ht 72.0 in | Wt 186.5 lb

## 2016-07-30 DIAGNOSIS — C61 Malignant neoplasm of prostate: Secondary | ICD-10-CM | POA: Insufficient documentation

## 2016-07-30 DIAGNOSIS — R351 Nocturia: Secondary | ICD-10-CM | POA: Diagnosis not present

## 2016-07-30 DIAGNOSIS — R079 Chest pain, unspecified: Secondary | ICD-10-CM

## 2016-07-30 DIAGNOSIS — N183 Chronic kidney disease, stage 3 unspecified: Secondary | ICD-10-CM

## 2016-07-30 DIAGNOSIS — N521 Erectile dysfunction due to diseases classified elsewhere: Secondary | ICD-10-CM | POA: Diagnosis not present

## 2016-07-30 DIAGNOSIS — D631 Anemia in chronic kidney disease: Secondary | ICD-10-CM

## 2016-07-30 LAB — COMPREHENSIVE METABOLIC PANEL
ALK PHOS: 56 U/L (ref 38–126)
ALT: 19 U/L (ref 17–63)
AST: 21 U/L (ref 15–41)
Albumin: 4.3 g/dL (ref 3.5–5.0)
Anion gap: 5 (ref 5–15)
BILIRUBIN TOTAL: 0.6 mg/dL (ref 0.3–1.2)
BUN: 22 mg/dL — AB (ref 6–20)
CALCIUM: 9.3 mg/dL (ref 8.9–10.3)
CO2: 29 mmol/L (ref 22–32)
Chloride: 102 mmol/L (ref 101–111)
Creatinine, Ser: 1.73 mg/dL — ABNORMAL HIGH (ref 0.61–1.24)
GFR calc Af Amer: 41 mL/min — ABNORMAL LOW (ref >60)
GFR calc non Af Amer: 36 mL/min — ABNORMAL LOW (ref >60)
GLUCOSE: 153 mg/dL — AB (ref 65–99)
Potassium: 4.2 mmol/L (ref 3.5–5.1)
Sodium: 136 mmol/L (ref 135–145)
TOTAL PROTEIN: 7.8 g/dL (ref 6.5–8.1)

## 2016-07-30 LAB — CBC WITH DIFFERENTIAL/PLATELET
BASOS ABS: 0 10*3/uL (ref 0.0–0.1)
BASOS PCT: 1 %
EOS ABS: 0.2 10*3/uL (ref 0.0–0.7)
Eosinophils Relative: 3 %
HEMATOCRIT: 37 % — AB (ref 39.0–52.0)
HEMOGLOBIN: 13.3 g/dL (ref 13.0–17.0)
LYMPHS ABS: 1.3 10*3/uL (ref 0.7–4.0)
Lymphocytes Relative: 25 %
MCH: 28.3 pg (ref 26.0–34.0)
MCHC: 35.9 g/dL (ref 30.0–36.0)
MCV: 78.7 fL (ref 78.0–100.0)
Monocytes Absolute: 0.3 10*3/uL (ref 0.1–1.0)
Monocytes Relative: 7 %
NEUTROS PCT: 64 %
Neutro Abs: 3.2 10*3/uL (ref 1.7–7.7)
Platelets: 155 10*3/uL (ref 150–400)
RBC: 4.7 MIL/uL (ref 4.22–5.81)
RDW: 12.9 % (ref 11.5–15.5)
WBC: 5 10*3/uL (ref 4.0–10.5)

## 2016-07-30 LAB — PSA: PSA: <0.01 ng/mL (ref 0.00–4.00)

## 2016-07-30 NOTE — Progress Notes (Signed)
Richard Bogus, MD [address] [place] [address]  Anemia in stage 3 chronic kidney disease  Prostate cancer [facility])  CURRENT THERAPY: Observation  INTERVAL HISTORY: Richard Swanson 82 y.o. male returns for followup of History of a Gleason's score 5 prostate cancer. S/P radiation therapy by Dr. Valere Dross on 08/01/10- 09/14/10 and s/p radical prostatectomy in March 2006 by Dr. Michela Pitcher.  He is doing well.  Oncological, the patient denies any complaints.  His appetite is good.  His weight is stable.  He continues to have issues with nocturia which is at baseline.  He notes to urinations throughout night approximate 4 hours apart.  He still has a healthy sex life but is having issues with erectile dysfunction.  He continues to follow with his primary care provider about this.  He does note a right sided chest pain that is intermittent.  Described as a sharp.  It comes and goes.  It resolved spontaneously.  It only lasts for seconds.  No radiation of the pain.  No diaphoresis or shortness of breath associated with the discomfort.  Review of Systems  Constitutional: Negative.  Negative for chills, fever and weight loss.  HENT: Negative.   Eyes: Negative.   Respiratory: Negative.  Negative for cough.   Cardiovascular: Positive for chest pain.  Gastrointestinal: Negative.  Negative for blood in stool, constipation, diarrhea, melena, nausea and vomiting.  Genitourinary: Negative.   Musculoskeletal: Negative.   Skin: Negative.   Neurological: Negative.  Negative for weakness.  Endo/Heme/Allergies: Negative.   Psychiatric/Behavioral: Negative.     Past Medical History:  Diagnosis Date  . Cancer [facility])    Prostate  . Diabetes mellitus without complication ([place])   . Dizziness   . Hypertension   . Prostate cancer ([place]) 11/03/2010   dx 2006    Past Surgical History:  Procedure Laterality Date  . PROSTATE SURGERY      Family History  Problem Relation Age  of Onset  . Stroke Father     Social History   Social History  . Marital status: Married    Spouse name: N/A  . Number of children: N/A  . Years of education: N/A   Social History Main Topics  . Smoking status: Never Smoker  . Smokeless tobacco: Never Used  . Alcohol use No  . Drug use: No  . Sexual activity: Not Asked   Other Topics Concern  . None   Social History Narrative  . None     PHYSICAL EXAMINATION  ECOG PERFORMANCE STATUS: 0 - Asymptomatic  Vitals:   07/30/16 1041  BP: 132/65  Pulse: (!) 58  Resp: 16  Temp: 97.8 F (36.6 C)    GENERAL:alert, no distress, well nourished, well developed, comfortable, cooperative, smiling and unaccompanied SKIN: skin color, texture, turgor are normal, no rashes or significant lesions HEAD: Normocephalic, No masses, lesions, tenderness or abnormalities EYES: normal, EOMI, Conjunctiva are pink and non-injected EARS: External ears normal OROPHARYNX:lips, buccal mucosa, and tongue normal and mucous membranes are moist  NECK: supple, trachea midline LYMPH:  no palpable lymphadenopathy BREAST:not examined LUNGS: clear to auscultation HEART: regular rate & rhythm ABDOMEN:abdomen soft, non-tender and normal bowel sounds BACK: Back symmetric, no curvature. EXTREMITIES:less then 2 second capillary refill, no joint deformities, effusion, or inflammation, no skin discoloration, no cyanosis  NEURO: alert & oriented x 3 with fluent speech, no focal motor/sensory deficits, gait normal   LABORATORY DATA: CBC    Component Value  Date/Time   WBC 5.0 07/30/2016 0955   RBC 4.70 07/30/2016 0955   HGB 13.3 07/30/2016 0955   HCT 37.0 (L) 07/30/2016 0955   PLT 155 07/30/2016 0955   MCV 78.7 07/30/2016 0955   MCH 28.3 07/30/2016 0955   MCHC 35.9 07/30/2016 0955   RDW 12.9 07/30/2016 0955   LYMPHSABS 1.3 07/30/2016 0955   MONOABS 0.3 07/30/2016 0955   EOSABS 0.2 07/30/2016 0955   BASOSABS 0.0 07/30/2016 0955      Chemistry       Component Value Date/Time   NA 136 07/30/2016 0955   K 4.2 07/30/2016 0955   CL 102 07/30/2016 0955   CO2 29 07/30/2016 0955   BUN 22 (H) 07/30/2016 0955   CREATININE 1.73 (H) 07/30/2016 0955      Component Value Date/Time   CALCIUM 9.3 07/30/2016 0955   ALKPHOS 56 07/30/2016 0955   AST 21 07/30/2016 0955   ALT 19 07/30/2016 0955   BILITOT 0.6 07/30/2016 0955     Lab Results  Component Value Date   PSA 0.01 01/30/2016   PSA 0.01 07/31/2015   PSA 0.01 01/30/2015     PENDING LABS:   RADIOGRAPHIC STUDIES:  No results found.   PATHOLOGY:    ASSESSMENT AND PLAN:  Prostate cancer ([place]) History of a Gleason's score 5 prostate cancer. S/p radiation therapy by Dr. Valere Dross on 08/01/10- 09/14/10 and s/p radical prostatectomy in March 2006 by Dr. Michela Pitcher.  Labs today: CBC diff, CMET, PSA.  I personally reviewed and went over laboratory results with the patient.  The results are noted within this dictation.  Labs in 6 months: CBC diff, CMET, PSA.  He is still having nocturia.  No intervention needed at this time.  He continues to have issues with erectile dysfunction.  This is being followed by PCP.  He reports a right sided chest pain as described above.  If it becomes more frequent, lasts longer, or becomes more intense, he is to report to the ED.  He is encouraged to follow-up with primary care provider about this as well.  Ongoing light exercising is encouraged.  He is doing well with that.  Problem list reviewed with patient and edited accordingly.  Medications are reviewed with the patient and edited accordingly.  Return in 6 months for follow-up.  More than 50% of the time spent with the patient was utilized for counseling and coordination of care.  Anemia in chronic kidney disease Stable.   ORDERS PLACED FOR THIS ENCOUNTER: No orders of the defined types were placed in this encounter.   MEDICATIONS PRESCRIBED THIS ENCOUNTER: No orders of the defined types  were placed in this encounter.   THERAPY PLAN:  Continue observation.  All questions were answered. The patient knows to call the clinic with any problems, questions or concerns. We can certainly see the patient much sooner if necessary.  Patient and plan discussed with Dr. Twana First and she is in agreement with the aforementioned.   This note is electronically signed by: Doy Mince 07/30/2016 10:57 AM

## 2016-07-30 NOTE — Patient Instructions (Addendum)
[facility] at [facility] Discharge Instructions  RECOMMENDATIONS MADE BY THE CONSULTANT AND ANY TEST RESULTS WILL BE SENT TO YOUR REFERRING PHYSICIAN.  You were seen today by Tom Kefalas PA-C. Return in 6 months for labs and follow up.   Thank you for choosing [facility] at [facility] to provide your oncology and hematology care.  To afford each patient quality time with our provider, please arrive at least 15 minutes before your scheduled appointment time.    If you have a lab appointment with the Cancer Center please come in thru the  Main Entrance and check in at the main information desk  You need to re-schedule your appointment should you arrive 10 or more minutes late.  We strive to give you quality time with our providers, and arriving late affects you and other patients whose appointments are after yours.  Also, if you no show three or more times for appointments you may be dismissed from the clinic at the providers discretion.     Again, thank you for choosing [facility].  Our hope is that these requests will decrease the amount of time that you wait before being seen by our physicians.       _____________________________________________________________  Should you have questions after your visit to  Cancer Center, please contact our office at [phone] between the hours of 8:30 a.m. and 4:30 p.m.  Voicemails left after 4:30 p.m. will not be returned until the following business day.  For prescription refill requests, have your pharmacy contact our office.       Resources For Cancer Patients and their Caregivers ? American Cancer Society: Can assist with transportation, wigs, general needs, runs Look Good Feel Better.        1-888-227-6333 ? Cancer Care: Provides financial assistance, online support groups, medication/co-pay assistance.  1-800-813-HOPE (4673) ? [facility] Assists [place] cancer patients and their families through emotional , educational and financial support.  336-427-4357 ? [place] DSS Where to apply for food stamps, Medicaid and utility assistance. 336-342-1394 ? RCATS: Transportation to medical appointments. 336-347-2287 ? Social Security Administration: May apply for disability if have a Stage IV cancer. 336-342-7796 1-800-772-1213 ? [place] Aging, Disability and Transit Services: Assists with nutrition, care and transit needs. 336-349-2343  Cancer Center Support Programs: @10RELATIVEDAYS@ > Cancer Support Group  2nd Tuesday of the month 1pm-2pm, Journey Room  > Creative Journey  3rd Tuesday of the month 1130am-1pm, Journey Room  > Look Good Feel Better  1st Wednesday of the month 10am-12 noon, Journey Room (Call American Cancer Society to register 1-800-395-5775)    

## 2016-07-30 NOTE — Assessment & Plan Note (Signed)
Stable

## 2016-07-30 NOTE — Assessment & Plan Note (Addendum)
History of a Gleason's score 5 prostate cancer. S/p radiation therapy by Dr. Valere Dross on 08/01/10- 09/14/10 and s/p radical prostatectomy in March 2006 by Dr. Michela Pitcher.  Labs today: CBC diff, CMET, PSA.  I personally reviewed and went over laboratory results with the patient.  The results are noted within this dictation.  Labs in 6 months: CBC diff, CMET, PSA.  He is still having nocturia.  No intervention needed at this time.  He continues to have issues with erectile dysfunction.  This is being followed by PCP.  He reports a right sided chest pain as described above.  If it becomes more frequent, lasts longer, or becomes more intense, he is to report to the ED.  He is encouraged to follow-up with primary care provider about this as well.  Ongoing light exercising is encouraged.  He is doing well with that.  Problem list reviewed with patient and edited accordingly.  Medications are reviewed with the patient and edited accordingly.  Return in 6 months for follow-up.  More than 50% of the time spent with the patient was utilized for counseling and coordination of care.

## 2016-09-10 DIAGNOSIS — H52223 Regular astigmatism, bilateral: Secondary | ICD-10-CM | POA: Diagnosis not present

## 2016-09-10 DIAGNOSIS — H401132 Primary open-angle glaucoma, bilateral, moderate stage: Secondary | ICD-10-CM | POA: Diagnosis not present

## 2016-09-10 DIAGNOSIS — H26493 Other secondary cataract, bilateral: Secondary | ICD-10-CM | POA: Diagnosis not present

## 2016-09-10 DIAGNOSIS — Z961 Presence of intraocular lens: Secondary | ICD-10-CM | POA: Diagnosis not present

## 2016-10-23 DIAGNOSIS — H26493 Other secondary cataract, bilateral: Secondary | ICD-10-CM | POA: Diagnosis not present

## 2016-10-23 DIAGNOSIS — H401132 Primary open-angle glaucoma, bilateral, moderate stage: Secondary | ICD-10-CM | POA: Diagnosis not present

## 2016-12-16 ENCOUNTER — Encounter: Payer: Self-pay | Admitting: Family Medicine

## 2016-12-16 DIAGNOSIS — E1121 Type 2 diabetes mellitus with diabetic nephropathy: Secondary | ICD-10-CM | POA: Diagnosis not present

## 2016-12-16 DIAGNOSIS — I129 Hypertensive chronic kidney disease with stage 1 through stage 4 chronic kidney disease, or unspecified chronic kidney disease: Secondary | ICD-10-CM | POA: Diagnosis not present

## 2016-12-16 DIAGNOSIS — C61 Malignant neoplasm of prostate: Secondary | ICD-10-CM | POA: Diagnosis not present

## 2016-12-16 DIAGNOSIS — R310 Gross hematuria: Secondary | ICD-10-CM | POA: Diagnosis not present

## 2017-01-28 ENCOUNTER — Other Ambulatory Visit (HOSPITAL_COMMUNITY): Payer: Self-pay | Admitting: *Deleted

## 2017-01-28 DIAGNOSIS — C61 Malignant neoplasm of prostate: Secondary | ICD-10-CM

## 2017-01-29 ENCOUNTER — Encounter (HOSPITAL_COMMUNITY): Payer: Self-pay | Admitting: Oncology

## 2017-01-29 ENCOUNTER — Encounter (HOSPITAL_COMMUNITY): Payer: Medicare Other | Attending: Oncology | Admitting: Oncology

## 2017-01-29 ENCOUNTER — Encounter (HOSPITAL_COMMUNITY): Payer: Medicare Other | Attending: Oncology

## 2017-01-29 VITALS — BP 129/59 | HR 59 | Temp 98.4°F | Resp 16 | Ht 71.0 in | Wt 183.0 lb

## 2017-01-29 DIAGNOSIS — Z23 Encounter for immunization: Secondary | ICD-10-CM

## 2017-01-29 DIAGNOSIS — C61 Malignant neoplasm of prostate: Secondary | ICD-10-CM | POA: Diagnosis not present

## 2017-01-29 DIAGNOSIS — N189 Chronic kidney disease, unspecified: Secondary | ICD-10-CM | POA: Diagnosis not present

## 2017-01-29 DIAGNOSIS — D631 Anemia in chronic kidney disease: Secondary | ICD-10-CM | POA: Diagnosis not present

## 2017-01-29 DIAGNOSIS — Z8546 Personal history of malignant neoplasm of prostate: Secondary | ICD-10-CM | POA: Diagnosis not present

## 2017-01-29 LAB — CBC WITH DIFFERENTIAL/PLATELET
Basophils Absolute: 0 10*3/uL (ref 0.0–0.1)
Basophils Relative: 1 %
EOS PCT: 3 %
Eosinophils Absolute: 0.1 10*3/uL (ref 0.0–0.7)
HEMATOCRIT: 33.3 % — AB (ref 39.0–52.0)
Hemoglobin: 11.9 g/dL — ABNORMAL LOW (ref 13.0–17.0)
LYMPHS PCT: 20 %
Lymphs Abs: 0.9 10*3/uL (ref 0.7–4.0)
MCH: 28.2 pg (ref 26.0–34.0)
MCHC: 35.7 g/dL (ref 30.0–36.0)
MCV: 78.9 fL (ref 78.0–100.0)
MONO ABS: 0.4 10*3/uL (ref 0.1–1.0)
MONOS PCT: 10 %
NEUTROS ABS: 2.8 10*3/uL (ref 1.7–7.7)
Neutrophils Relative %: 66 %
PLATELETS: 143 10*3/uL — AB (ref 150–400)
RBC: 4.22 MIL/uL (ref 4.22–5.81)
RDW: 13.4 % (ref 11.5–15.5)
WBC: 4.3 10*3/uL (ref 4.0–10.5)

## 2017-01-29 LAB — COMPREHENSIVE METABOLIC PANEL
ALT: 19 U/L (ref 17–63)
ANION GAP: 7 (ref 5–15)
AST: 21 U/L (ref 15–41)
Albumin: 4.1 g/dL (ref 3.5–5.0)
Alkaline Phosphatase: 50 U/L (ref 38–126)
BILIRUBIN TOTAL: 0.6 mg/dL (ref 0.3–1.2)
BUN: 18 mg/dL (ref 6–20)
CO2: 27 mmol/L (ref 22–32)
Calcium: 9 mg/dL (ref 8.9–10.3)
Chloride: 102 mmol/L (ref 101–111)
Creatinine, Ser: 1.74 mg/dL — ABNORMAL HIGH (ref 0.61–1.24)
GFR, EST AFRICAN AMERICAN: 41 mL/min — AB (ref >60)
GFR, EST NON AFRICAN AMERICAN: 35 mL/min — AB (ref >60)
Glucose, Bld: 141 mg/dL — ABNORMAL HIGH (ref 65–99)
POTASSIUM: 4.3 mmol/L (ref 3.5–5.1)
Sodium: 136 mmol/L (ref 135–145)
TOTAL PROTEIN: 7 g/dL (ref 6.5–8.1)

## 2017-01-29 LAB — PSA: Prostatic Specific Antigen: <0.01 ng/mL (ref 0.00–4.00)

## 2017-01-29 MED ORDER — INFLUENZA VAC SPLIT QUAD 0.5 ML IM SUSY
PREFILLED_SYRINGE | INTRAMUSCULAR | Status: AC
  Filled 2017-01-29: qty 0.5

## 2017-01-29 MED ORDER — INFLUENZA VAC SPLIT QUAD 0.5 ML IM SUSY
0.5000 mL | PREFILLED_SYRINGE | Freq: Once | INTRAMUSCULAR | Status: AC
  Administered 2017-01-29: 0.5 mL via INTRAMUSCULAR

## 2017-01-29 NOTE — Progress Notes (Signed)
Sinda Du, MD [place] [address]  Need for prophylactic vaccination and inoculation against influenza  CURRENT THERAPY: Observation  INTERVAL HISTORY: Richard Swanson 82 y.o. male returns for followup of History of a Gleason's score 5 prostate cancer. S/P radiation therapy by Dr. Valere Dross on 08/01/10- 09/14/10 and s/p radical prostatectomy in March 2006 by Dr. Michela Pitcher.  Patient states that he has been doing well. He had a UTI with hematuria 1 month ago for which he took antibiotics and his symptoms cleared up. Otherwise he has not had any other infections. He denies any fatigue, chest pain, shortness breath, abdominal pain, focal weakness, urinary symptoms.  Review of Systems  Constitutional: Negative.  Negative for chills, fever and weight loss.  HENT: Negative.   Eyes: Negative.   Respiratory: Negative.  Negative for cough.   Cardiovascular: Negative for chest pain.  Gastrointestinal: Negative.  Negative for blood in stool, constipation, diarrhea, melena, nausea and vomiting.  Genitourinary: Negative.   Musculoskeletal: Negative.   Skin: Negative.   Neurological: Negative.  Negative for weakness.  Endo/Heme/Allergies: Negative.   Psychiatric/Behavioral: Negative.     Past Medical History:  Diagnosis Date  . Cancer [facility])    Prostate  . Diabetes mellitus without complication ([place])   . Dizziness   . Hypertension   . Prostate cancer ([place]) 11/03/2010   dx 2006    Past Surgical History:  Procedure Laterality Date  . PROSTATE SURGERY      Family History  Problem Relation Age of Onset  . Stroke Father     Social History   Social History  . Marital status: Married    Spouse name: N/A  . Number of children: N/A  . Years of education: N/A   Social History Main Topics  . Smoking status: Never Smoker  . Smokeless tobacco: Never Used  . Alcohol use No  . Drug use: No  . Sexual activity: Not Asked   Other Topics Concern    . None   Social History Narrative  . None     PHYSICAL EXAMINATION  ECOG PERFORMANCE STATUS: 0 - Asymptomatic  Vitals:   01/29/17 1021  BP: (!) 129/59  Pulse: (!) 59  Resp: 16  Temp: 98.4 F (36.9 C)  SpO2: 100%    Constitutional: Well-developed, well-nourished, and in no distress. Looks younger than stated age.  HENT:  Head: Normocephalic and atraumatic.  Mouth/Throat: No oropharyngeal exudate. Mucosa moist. Eyes: Pupils are equal, round, and reactive to light. Conjunctivae are normal. No scleral icterus.  Neck: Normal range of motion. Neck supple. No JVD present.  Cardiovascular: Normal rate, regular rhythm and normal heart sounds.  Exam reveals no gallop and no friction rub.   No murmur heard. Pulmonary/Chest: Effort normal and breath sounds normal. No respiratory distress. No wheezes.No rales.  Abdominal: Soft. Bowel sounds are normal. No distension. There is no tenderness. There is no guarding.  Musculoskeletal: No edema or tenderness.  Lymphadenopathy:    No cervical or supraclavicular adenopathy.  Neurological: Alert and oriented to person, place, and time. No cranial nerve deficit.  Skin: Skin is warm and dry. No rash noted. No erythema. No pallor.  Psychiatric: Affect and judgment normal.     LABORATORY DATA: CBC    Component Value Date/Time   WBC 4.3 01/29/2017 0932   RBC 4.22 01/29/2017 0932   HGB 11.9 (L) 01/29/2017 0932   HCT 33.3 (L) 01/29/2017 0932   PLT 143 (L)  01/29/2017 0932   MCV 78.9 01/29/2017 0932   MCH 28.2 01/29/2017 0932   MCHC 35.7 01/29/2017 0932   RDW 13.4 01/29/2017 0932   LYMPHSABS 0.9 01/29/2017 0932   MONOABS 0.4 01/29/2017 0932   EOSABS 0.1 01/29/2017 0932   BASOSABS 0.0 01/29/2017 0932      Chemistry      Component Value Date/Time   NA 136 01/29/2017 0932   K 4.3 01/29/2017 0932   CL 102 01/29/2017 0932   CO2 27 01/29/2017 0932   BUN 18 01/29/2017 0932   CREATININE 1.74 (H) 01/29/2017 0932      Component Value  Date/Time   CALCIUM 9.0 01/29/2017 0932   ALKPHOS 50 01/29/2017 0932   AST 21 01/29/2017 0932   ALT 19 01/29/2017 0932   BILITOT 0.6 01/29/2017 0932     Lab Results  Component Value Date   PSA <0.01 07/30/2016   PSA 0.01 01/30/2016   PSA 0.01 07/31/2015     PENDING LABS:   RADIOGRAPHIC STUDIES:  No results found.   PATHOLOGY:    ASSESSMENT AND PLAN:  1. History of a Gleason's score 5 prostate cancer. S/P radiation therapy by Dr. Valere Dross on 08/01/10- 09/14/10 and s/p radical prostatectomy in March 2006 by Dr. Michela Pitcher. -PSA levels have all remained low and stable. PSA is pending today. Continue PSA evaluation every 6 months. -Clinically NED.  2. Anemia in the setting of CKD -Patient's hemoglobin was 13 g/dL 6 months ago but is down to 11.9 g/dL today. He is asymptomatic from the drop in his hemoglobin. His anemia may be secondary to recent hematuria from UTI. Will add on vitamin B-12, folate, iron studies to his labs today to see if he needs any nutritional supplementation. Labs reviewed with the patient today. -Continue monitoring of his blood counts. Repeat CBC, CMP on his next visit.  Return to clinic in 6 months for follow-up.  ORDERS PLACED FOR THIS ENCOUNTER: Orders Placed This Encounter  Procedures  . CBC with Differential  . Comprehensive metabolic panel  . PSA    MEDICATIONS PRESCRIBED THIS ENCOUNTER: Meds ordered this encounter  Medications  . Influenza vac split quadrivalent PF (FLUARIX) injection 0.5 mL    THERAPY PLAN:  Continue observation.  All questions were answered. The patient knows to call the clinic with any problems, questions or concerns. We can certainly see the patient much sooner if necessary.   This note is electronically signed by: Twana First, MD 01/29/2017 10:28 AM

## 2017-01-29 NOTE — Progress Notes (Signed)
Pt given flu shot in left deltoid. Pt tolerated well. Pt stable and discharged home ambulatory.  

## 2017-04-23 DIAGNOSIS — H52223 Regular astigmatism, bilateral: Secondary | ICD-10-CM | POA: Diagnosis not present

## 2017-04-23 DIAGNOSIS — H401132 Primary open-angle glaucoma, bilateral, moderate stage: Secondary | ICD-10-CM | POA: Diagnosis not present

## 2017-04-23 DIAGNOSIS — Z961 Presence of intraocular lens: Secondary | ICD-10-CM | POA: Diagnosis not present

## 2017-04-23 DIAGNOSIS — H26493 Other secondary cataract, bilateral: Secondary | ICD-10-CM | POA: Diagnosis not present

## 2017-05-05 DIAGNOSIS — C61 Malignant neoplasm of prostate: Secondary | ICD-10-CM | POA: Diagnosis not present

## 2017-05-05 DIAGNOSIS — Z0000 Encounter for general adult medical examination without abnormal findings: Secondary | ICD-10-CM | POA: Diagnosis not present

## 2017-05-05 DIAGNOSIS — K219 Gastro-esophageal reflux disease without esophagitis: Secondary | ICD-10-CM | POA: Diagnosis not present

## 2017-05-05 DIAGNOSIS — I129 Hypertensive chronic kidney disease with stage 1 through stage 4 chronic kidney disease, or unspecified chronic kidney disease: Secondary | ICD-10-CM | POA: Diagnosis not present

## 2017-05-05 DIAGNOSIS — Z125 Encounter for screening for malignant neoplasm of prostate: Secondary | ICD-10-CM | POA: Diagnosis not present

## 2017-05-05 DIAGNOSIS — J449 Chronic obstructive pulmonary disease, unspecified: Secondary | ICD-10-CM | POA: Diagnosis not present

## 2017-05-05 LAB — BASIC METABOLIC PANEL WITH GFR
BUN: 18 (ref 4–21)
Creatinine: 1.7 — AB (ref 0.6–1.3)
Glucose: 155

## 2017-05-05 LAB — LIPID PANEL
Cholesterol: 191 (ref 0–200)
HDL: 53 (ref 35–70)
LDL Cholesterol: 117
Triglycerides: 106 (ref 40–160)

## 2017-05-05 LAB — TSH: TSH: 1.48 (ref <=5.90)

## 2017-05-05 LAB — PSA: PSA: <0.1

## 2017-05-21 DIAGNOSIS — K210 Gastro-esophageal reflux disease with esophagitis: Secondary | ICD-10-CM | POA: Diagnosis not present

## 2017-05-21 DIAGNOSIS — I10 Essential (primary) hypertension: Secondary | ICD-10-CM | POA: Diagnosis not present

## 2017-05-21 DIAGNOSIS — E1121 Type 2 diabetes mellitus with diabetic nephropathy: Secondary | ICD-10-CM | POA: Diagnosis not present

## 2017-05-21 DIAGNOSIS — I129 Hypertensive chronic kidney disease with stage 1 through stage 4 chronic kidney disease, or unspecified chronic kidney disease: Secondary | ICD-10-CM | POA: Diagnosis not present

## 2017-05-29 DIAGNOSIS — Z0000 Encounter for general adult medical examination without abnormal findings: Secondary | ICD-10-CM | POA: Diagnosis not present

## 2017-05-30 DIAGNOSIS — R809 Proteinuria, unspecified: Secondary | ICD-10-CM | POA: Diagnosis not present

## 2017-05-30 DIAGNOSIS — I10 Essential (primary) hypertension: Secondary | ICD-10-CM | POA: Diagnosis not present

## 2017-05-30 DIAGNOSIS — D649 Anemia, unspecified: Secondary | ICD-10-CM | POA: Diagnosis not present

## 2017-05-30 DIAGNOSIS — N183 Chronic kidney disease, stage 3 (moderate): Secondary | ICD-10-CM | POA: Diagnosis not present

## 2017-06-03 DIAGNOSIS — Z1211 Encounter for screening for malignant neoplasm of colon: Secondary | ICD-10-CM | POA: Diagnosis not present

## 2017-06-03 DIAGNOSIS — D509 Iron deficiency anemia, unspecified: Secondary | ICD-10-CM | POA: Diagnosis not present

## 2017-06-03 DIAGNOSIS — Z1159 Encounter for screening for other viral diseases: Secondary | ICD-10-CM | POA: Diagnosis not present

## 2017-06-03 DIAGNOSIS — I10 Essential (primary) hypertension: Secondary | ICD-10-CM | POA: Diagnosis not present

## 2017-06-03 DIAGNOSIS — N183 Chronic kidney disease, stage 3 (moderate): Secondary | ICD-10-CM | POA: Diagnosis not present

## 2017-06-03 DIAGNOSIS — E559 Vitamin D deficiency, unspecified: Secondary | ICD-10-CM | POA: Diagnosis not present

## 2017-06-03 DIAGNOSIS — Z79899 Other long term (current) drug therapy: Secondary | ICD-10-CM | POA: Diagnosis not present

## 2017-06-03 DIAGNOSIS — R809 Proteinuria, unspecified: Secondary | ICD-10-CM | POA: Diagnosis not present

## 2017-06-03 LAB — FECAL OCCULT BLOOD, GUAIAC: Fecal Occult Blood: NEGATIVE

## 2017-06-13 DIAGNOSIS — N183 Chronic kidney disease, stage 3 (moderate): Secondary | ICD-10-CM | POA: Diagnosis not present

## 2017-06-13 DIAGNOSIS — D638 Anemia in other chronic diseases classified elsewhere: Secondary | ICD-10-CM | POA: Diagnosis not present

## 2017-07-22 ENCOUNTER — Other Ambulatory Visit (HOSPITAL_COMMUNITY): Payer: Self-pay | Admitting: *Deleted

## 2017-07-22 DIAGNOSIS — C61 Malignant neoplasm of prostate: Secondary | ICD-10-CM

## 2017-07-23 ENCOUNTER — Inpatient Hospital Stay (HOSPITAL_COMMUNITY): Payer: Medicare Other | Attending: Internal Medicine

## 2017-07-23 DIAGNOSIS — C61 Malignant neoplasm of prostate: Secondary | ICD-10-CM | POA: Diagnosis not present

## 2017-07-23 LAB — COMPREHENSIVE METABOLIC PANEL
ALT: 18 U/L (ref 17–63)
AST: 22 U/L (ref 15–41)
Albumin: 4 g/dL (ref 3.5–5.0)
Alkaline Phosphatase: 49 U/L (ref 38–126)
Anion gap: 12 (ref 5–15)
BUN: 23 mg/dL — AB (ref 6–20)
CHLORIDE: 105 mmol/L (ref 101–111)
CO2: 25 mmol/L (ref 22–32)
CREATININE: 1.77 mg/dL — AB (ref 0.61–1.24)
Calcium: 9.2 mg/dL (ref 8.9–10.3)
GFR calc Af Amer: 40 mL/min — ABNORMAL LOW (ref >60)
GFR calc non Af Amer: 34 mL/min — ABNORMAL LOW (ref >60)
Glucose, Bld: 132 mg/dL — ABNORMAL HIGH (ref 65–99)
Potassium: 4.3 mmol/L (ref 3.5–5.1)
SODIUM: 142 mmol/L (ref 135–145)
Total Bilirubin: 0.8 mg/dL (ref 0.3–1.2)
Total Protein: 7.4 g/dL (ref 6.5–8.1)

## 2017-07-23 LAB — CBC WITH DIFFERENTIAL/PLATELET
BASOS ABS: 0 10*3/uL (ref 0.0–0.1)
Basophils Relative: 0 %
EOS PCT: 3 %
Eosinophils Absolute: 0.2 10*3/uL (ref 0.0–0.7)
HCT: 34.5 % — ABNORMAL LOW (ref 39.0–52.0)
HEMOGLOBIN: 11.8 g/dL — AB (ref 13.0–17.0)
LYMPHS ABS: 1.1 10*3/uL (ref 0.7–4.0)
Lymphocytes Relative: 21 %
MCH: 27.6 pg (ref 26.0–34.0)
MCHC: 34.2 g/dL (ref 30.0–36.0)
MCV: 80.6 fL (ref 78.0–100.0)
Monocytes Absolute: 0.3 10*3/uL (ref 0.1–1.0)
Monocytes Relative: 6 %
NEUTROS PCT: 70 %
Neutro Abs: 3.6 10*3/uL (ref 1.7–7.7)
PLATELETS: 157 10*3/uL (ref 150–400)
RBC: 4.28 MIL/uL (ref 4.22–5.81)
RDW: 13.5 % (ref 11.5–15.5)
WBC: 5.2 10*3/uL (ref 4.0–10.5)

## 2017-07-23 LAB — PSA: Prostatic Specific Antigen: <0.01 ng/mL (ref 0.00–4.00)

## 2017-07-30 ENCOUNTER — Encounter (HOSPITAL_COMMUNITY): Payer: Self-pay | Admitting: Internal Medicine

## 2017-07-30 ENCOUNTER — Other Ambulatory Visit: Payer: Self-pay

## 2017-07-30 ENCOUNTER — Inpatient Hospital Stay (HOSPITAL_COMMUNITY): Payer: Medicare Other | Attending: Internal Medicine | Admitting: Internal Medicine

## 2017-07-30 VITALS — BP 126/50 | HR 55 | Temp 97.9°F | Resp 16 | Wt 186.0 lb

## 2017-07-30 DIAGNOSIS — C61 Malignant neoplasm of prostate: Secondary | ICD-10-CM | POA: Insufficient documentation

## 2017-07-30 DIAGNOSIS — Z9079 Acquired absence of other genital organ(s): Secondary | ICD-10-CM | POA: Insufficient documentation

## 2017-07-30 DIAGNOSIS — D631 Anemia in chronic kidney disease: Secondary | ICD-10-CM | POA: Insufficient documentation

## 2017-07-30 DIAGNOSIS — E119 Type 2 diabetes mellitus without complications: Secondary | ICD-10-CM | POA: Diagnosis not present

## 2017-07-30 DIAGNOSIS — N189 Chronic kidney disease, unspecified: Secondary | ICD-10-CM | POA: Diagnosis not present

## 2017-07-30 DIAGNOSIS — I129 Hypertensive chronic kidney disease with stage 1 through stage 4 chronic kidney disease, or unspecified chronic kidney disease: Secondary | ICD-10-CM | POA: Insufficient documentation

## 2017-07-30 NOTE — Patient Instructions (Signed)
[place] at [facility] Discharge Instructions  You saw Dr. Walden Field today. Follow up in one year with labs.  Thank you for choosing [place] at [facility] to provide your oncology and hematology care.  To afford each patient quality time with our provider, please arrive at least 15 minutes before your scheduled appointment time.   If you have a lab appointment with the [facility] please come in thru the  Main Entrance and check in at the main information desk  You need to re-schedule your appointment should you arrive 10 or more minutes late.  We strive to give you quality time with our providers, and arriving late affects you and other patients whose appointments are after yours.  Also, if you no show three or more times for appointments you may be dismissed from the clinic at the providers discretion.     Again, thank you for choosing [facility].  Our hope is that these requests will decrease the amount of time that you wait before being seen by our physicians.       _____________________________________________________________  Should you have questions after your visit to [facility], please contact our office at [phone] between the hours of 8:30 a.m. and 4:30 p.m.  Voicemails left after 4:30 p.m. will not be returned until the following business day.  For prescription refill requests, have your pharmacy contact our office.       Resources For Cancer Patients and their Caregivers ? American Cancer Society: Can assist with transportation, wigs, general needs, runs Look Good Feel Better.        3644748110 ? Cancer Care: Provides financial assistance, online support groups, medication/co-pay assistance.  1-800-813-HOPE (954) 756-0865) ? [facility] Assists [place] cancer patients and their families through emotional , educational and financial support.  701-568-5533 ? [place] DSS Where to apply for food stamps, Medicaid and utility assistance. 804-442-8340 ? [facility]: Transportation to medical appointments. 914-092-5321 ? Social Security Administration: May apply for disability if have a Stage IV cancer. (661)462-0724 320 015 3761 ? [employer], Disability and Transit Services: Assists with nutrition, care and transit needs. [place] Support Programs:   > Cancer Support Group  2nd Tuesday of the month 1pm-2pm, Journey Room   > Creative Journey  3rd Tuesday of the month 1130am-1pm, Journey Room

## 2017-07-30 NOTE — Progress Notes (Signed)
Diagnosis Prostate cancer ([place]) - Plan: CBC with Differential/Platelet, Comprehensive metabolic panel, Lactate dehydrogenase, PSA  Staging Cancer Staging No matching staging information was found for the patient.  Assessment and Plan:  1. Gleason's score 5 prostate cancer. Pt is S/P radiation therapy by Dr. Valere Dross on 08/01/10- 09/14/10 and s/p radical prostatectomy in March 2006 by Dr. Michela Pitcher.  PSA levels have all remained < 0.  He is here for labs and PSA done done 07/23/2017 is less than 0.01.  PSA has remained at this level since 2017.  He will have his follow-up visits extended to once a year and will return to clinic for repeat lab evaluation at that time.  He is advised to notify the office if he has any issues such as abdominal pain, weight loss, shortness of breath or worsening bone pain prior to that time.    2.  Renal insufficiency.  Creatinine is stable at 1.77.  He will continue to follow-up with nephrology.  3. Anemia in the setting of CKD.  Hemoglobin is stable at 11.8.  He should continue his follow-up with nephrology.  He will have repeat labs on return to clinic in 1 year.  4.  Hypertension.  Blood pressure is 126/50.  Continue to follow-up with primary care physician.   Interval History:  83 y.o. male with history of a Gleason's score 5 prostate cancer. S/P radiation therapy by Dr. Valere Dross on 08/01/10- 09/14/10 and s/p radical prostatectomy in March 2006 by Dr. Michela Pitcher.  Current Status: Patient is seen today for follow-up.  He is here today to go over lab studies.   Problem List Patient Active Problem List   Diagnosis Date Noted  . Anemia in chronic kidney disease [N18.9, D63.1] 07/31/2015  . Prostate cancer ([place]) [C61] 11/03/2010  . DYSPEPSIA [K31.89, R10.13] 08/30/2008  . CHEST PAIN, DULL [R07.9] 08/30/2008    Past Medical History Past Medical History:  Diagnosis Date  . Cancer [facility])    Prostate  . Diabetes mellitus without complication ([place])   . Dizziness   .  Hypertension   . Prostate cancer ([place]) 11/03/2010   dx 2006    Past Surgical History Past Surgical History:  Procedure Laterality Date  . PROSTATE SURGERY      Family History Family History  Problem Relation Age of Onset  . Stroke Father      Social History  reports that he has never smoked. He has never used smokeless tobacco. He reports that he does not drink alcohol or use drugs.  Medications  Current Outpatient Medications:  .  amLODipine (NORVASC) 2.5 MG tablet, Take 2.5 mg by mouth daily., Disp: , Rfl:  .  COMBIGAN 0.2-0.5 % ophthalmic solution, 1 drop every 12 (twelve) hours. , Disp: , Rfl:  .  JANUMET 50-500 MG tablet, , Disp: , Rfl:  .  Multiple Vitamins-Minerals (CENTRUM SILVER PO), Take 1 capsule by mouth daily.  , Disp: , Rfl:  .  tamsulosin (FLOMAX) 0.4 MG CAPS capsule, Take 0.4 mg by mouth daily., Disp: , Rfl:   Allergies Patient has no known allergies.  Review of Systems Review of Systems - Oncology ROS as per HPI otherwise 12 point ROS is negative.   Physical Exam  Vitals Wt Readings from Last 3 Encounters:  07/30/17 186 lb (84.4 kg)  01/29/17 183 lb (83 kg)  07/30/16 186 lb 8 oz (84.6 kg)   Temp Readings from Last 3 Encounters:  07/30/17 97.9 F (36.6 C) (Oral)  01/29/17 98.4 F (36.9 C) (  Oral)  07/30/16 97.8 F (36.6 C) (Oral)   BP Readings from Last 3 Encounters:  07/30/17 (!) 126/50  01/29/17 (!) 129/59  07/30/16 132/65   Pulse Readings from Last 3 Encounters:  07/30/17 (!) 55  01/29/17 (!) 59  07/30/16 (!) 58   Constitutional: Well-developed, well-nourished, and in no distress.   HENT: Head: Normocephalic and atraumatic.  Mouth/Throat: No oropharyngeal exudate. Mucosa moist. Eyes: Pupils are equal, round, and reactive to light. Conjunctivae are normal. No scleral icterus.  Neck: Normal range of motion. Neck supple. No JVD present.  Cardiovascular: Normal rate, regular rhythm and normal heart sounds.  Exam reveals no gallop and no  friction rub.   No murmur heard. Pulmonary/Chest: Effort normal and breath sounds normal. No respiratory distress. No wheezes.No rales.  Abdominal: Soft. Bowel sounds are normal. No distension. There is no tenderness. There is no guarding.  Musculoskeletal: No edema or tenderness.  Lymphadenopathy: No cervical, axillary or supraclavicular adenopathy.  Neurological: Alert and oriented to person, place, and time. No cranial nerve deficit.  Skin: Skin is warm and dry. No rash noted. No erythema. No pallor.  Psychiatric: Affect and judgment normal.   Labs No visits with results within 3 Day(s) from this visit.  Latest known visit with results is:  Appointment on 07/23/2017  Component Date Value Ref Range Status  . WBC 07/23/2017 5.2  4.0 - 10.5 K/uL Final  . RBC 07/23/2017 4.28  4.22 - 5.81 MIL/uL Final  . Hemoglobin 07/23/2017 11.8* 13.0 - 17.0 g/dL Final  . HCT 07/23/2017 34.5* 39.0 - 52.0 % Final  . MCV 07/23/2017 80.6  78.0 - 100.0 fL Final  . MCH 07/23/2017 27.6  26.0 - 34.0 pg Final  . MCHC 07/23/2017 34.2  30.0 - 36.0 g/dL Final  . RDW 07/23/2017 13.5  11.5 - 15.5 % Final  . Platelets 07/23/2017 157  150 - 400 K/uL Final  . Neutrophils Relative % 07/23/2017 70  % Final  . Neutro Abs 07/23/2017 3.6  1.7 - 7.7 K/uL Final  . Lymphocytes Relative 07/23/2017 21  % Final  . Lymphs Abs 07/23/2017 1.1  0.7 - 4.0 K/uL Final  . Monocytes Relative 07/23/2017 6  % Final  . Monocytes Absolute 07/23/2017 0.3  0.1 - 1.0 K/uL Final  . Eosinophils Relative 07/23/2017 3  % Final  . Eosinophils Absolute 07/23/2017 0.2  0.0 - 0.7 K/uL Final  . Basophils Relative 07/23/2017 0  % Final  . Basophils Absolute 07/23/2017 0.0  0.0 - 0.1 K/uL Final   Performed at [facility], [address] [place], [place] [address]  . Sodium 07/23/2017 142  135 - 145 mmol/L Final  . Potassium 07/23/2017 4.3  3.5 - 5.1 mmol/L Final  . Chloride 07/23/2017 105  101 - 111 mmol/L Final  . CO2 07/23/2017 25  22 - 32  mmol/L Final  . Glucose, Bld 07/23/2017 132* 65 - 99 mg/dL Final  . BUN 07/23/2017 23* 6 - 20 mg/dL Final  . Creatinine, Ser 07/23/2017 1.77* 0.61 - 1.24 mg/dL Final  . Calcium 07/23/2017 9.2  8.9 - 10.3 mg/dL Final  . Total Protein 07/23/2017 7.4  6.5 - 8.1 g/dL Final  . Albumin 07/23/2017 4.0  3.5 - 5.0 g/dL Final  . AST 07/23/2017 22  15 - 41 U/L Final  . ALT 07/23/2017 18  17 - 63 U/L Final  . Alkaline Phosphatase 07/23/2017 49  38 - 126 U/L Final  . Total Bilirubin 07/23/2017 0.8  0.3 -  1.2 mg/dL Final  . GFR calc non Af Amer 07/23/2017 34* >60 mL/min Final  . GFR calc Af Amer 07/23/2017 40* >60 mL/min Final   Comment: (NOTE) The eGFR has been calculated using the CKD EPI equation. This calculation has not been validated in all clinical situations. eGFR's persistently <60 mL/min signify possible Chronic Kidney Disease.   Georgiann Hahn gap 07/23/2017 12  5 - 15 Final   Performed at [facility], [address] [place], [place] [address]  . Prostatic Specific Antigen 07/23/2017 <0.01  0.00 - 4.00 ng/mL Final   Comment: RESULT REPEATED AND VERIFIED Performed at [facility], [place] [address] [place], [place] [address]      Pathology Orders Placed This Encounter  Procedures  . CBC with Differential/Platelet    Standing Status:   Future    Standing Expiration Date:   04/01/2019  . Comprehensive metabolic panel    Standing Status:   Future    Standing Expiration Date:   04/01/2019  . Lactate dehydrogenase    Standing Status:   Future    Standing Expiration Date:   04/01/2019  . PSA    Standing Status:   Future    Standing Expiration Date:   04/01/2019       Zoila Shutter MD

## 2017-09-08 DIAGNOSIS — E119 Type 2 diabetes mellitus without complications: Secondary | ICD-10-CM | POA: Diagnosis not present

## 2017-09-08 DIAGNOSIS — H35033 Hypertensive retinopathy, bilateral: Secondary | ICD-10-CM | POA: Diagnosis not present

## 2017-09-08 DIAGNOSIS — H26493 Other secondary cataract, bilateral: Secondary | ICD-10-CM | POA: Diagnosis not present

## 2017-09-08 DIAGNOSIS — H401132 Primary open-angle glaucoma, bilateral, moderate stage: Secondary | ICD-10-CM | POA: Diagnosis not present

## 2017-09-08 DIAGNOSIS — Z961 Presence of intraocular lens: Secondary | ICD-10-CM | POA: Diagnosis not present

## 2017-09-12 ENCOUNTER — Other Ambulatory Visit (HOSPITAL_COMMUNITY): Payer: Self-pay | Admitting: Nephrology

## 2017-09-12 DIAGNOSIS — N183 Chronic kidney disease, stage 3 unspecified: Secondary | ICD-10-CM

## 2017-09-15 ENCOUNTER — Ambulatory Visit (HOSPITAL_COMMUNITY)
Admission: RE | Admit: 2017-09-15 | Discharge: 2017-09-15 | Disposition: A | Discharge status: Home / Self Care | Payer: Medicare Other | Source: Ambulatory Visit | Attending: Nephrology | Admitting: Nephrology

## 2017-09-15 DIAGNOSIS — N281 Cyst of kidney, acquired: Secondary | ICD-10-CM | POA: Diagnosis not present

## 2017-09-15 DIAGNOSIS — D509 Iron deficiency anemia, unspecified: Secondary | ICD-10-CM | POA: Diagnosis not present

## 2017-09-15 DIAGNOSIS — Z79899 Other long term (current) drug therapy: Secondary | ICD-10-CM | POA: Diagnosis not present

## 2017-09-15 DIAGNOSIS — N183 Chronic kidney disease, stage 3 unspecified: Secondary | ICD-10-CM

## 2017-09-15 DIAGNOSIS — R809 Proteinuria, unspecified: Secondary | ICD-10-CM | POA: Diagnosis not present

## 2017-09-15 DIAGNOSIS — E559 Vitamin D deficiency, unspecified: Secondary | ICD-10-CM | POA: Diagnosis not present

## 2017-09-17 DIAGNOSIS — N183 Chronic kidney disease, stage 3 (moderate): Secondary | ICD-10-CM | POA: Diagnosis not present

## 2017-09-17 DIAGNOSIS — D638 Anemia in other chronic diseases classified elsewhere: Secondary | ICD-10-CM | POA: Diagnosis not present

## 2017-09-17 DIAGNOSIS — N200 Calculus of kidney: Secondary | ICD-10-CM | POA: Diagnosis not present

## 2017-09-17 DIAGNOSIS — N281 Cyst of kidney, acquired: Secondary | ICD-10-CM | POA: Diagnosis not present

## 2017-10-02 ENCOUNTER — Other Ambulatory Visit (HOSPITAL_COMMUNITY): Payer: Self-pay | Admitting: Nephrology

## 2017-10-02 DIAGNOSIS — N2889 Other specified disorders of kidney and ureter: Secondary | ICD-10-CM

## 2017-10-02 DIAGNOSIS — N289 Disorder of kidney and ureter, unspecified: Secondary | ICD-10-CM

## 2017-10-09 ENCOUNTER — Ambulatory Visit (HOSPITAL_COMMUNITY)
Admission: RE | Admit: 2017-10-09 | Discharge: 2017-10-09 | Disposition: A | Discharge status: Home / Self Care | Payer: Medicare Other | Source: Ambulatory Visit | Attending: Nephrology | Admitting: Nephrology

## 2017-10-09 DIAGNOSIS — I700 Atherosclerosis of aorta: Secondary | ICD-10-CM | POA: Insufficient documentation

## 2017-10-09 DIAGNOSIS — N289 Disorder of kidney and ureter, unspecified: Secondary | ICD-10-CM

## 2017-10-09 DIAGNOSIS — N2889 Other specified disorders of kidney and ureter: Secondary | ICD-10-CM | POA: Diagnosis not present

## 2017-10-09 MED ORDER — GADOBENATE DIMEGLUMINE 529 MG/ML IV SOLN
8.0000 mL | Freq: Once | INTRAVENOUS | Status: AC | PRN
  Administered 2017-10-09: 8 mL via INTRAVENOUS

## 2017-10-27 DIAGNOSIS — N281 Cyst of kidney, acquired: Secondary | ICD-10-CM | POA: Diagnosis not present

## 2017-10-27 DIAGNOSIS — R3915 Urgency of urination: Secondary | ICD-10-CM | POA: Diagnosis not present

## 2017-12-18 DIAGNOSIS — E1121 Type 2 diabetes mellitus with diabetic nephropathy: Secondary | ICD-10-CM | POA: Diagnosis not present

## 2017-12-18 DIAGNOSIS — N183 Chronic kidney disease, stage 3 (moderate): Secondary | ICD-10-CM | POA: Diagnosis not present

## 2017-12-18 DIAGNOSIS — I129 Hypertensive chronic kidney disease with stage 1 through stage 4 chronic kidney disease, or unspecified chronic kidney disease: Secondary | ICD-10-CM | POA: Diagnosis not present

## 2017-12-19 DIAGNOSIS — I129 Hypertensive chronic kidney disease with stage 1 through stage 4 chronic kidney disease, or unspecified chronic kidney disease: Secondary | ICD-10-CM | POA: Diagnosis not present

## 2017-12-19 DIAGNOSIS — N183 Chronic kidney disease, stage 3 (moderate): Secondary | ICD-10-CM | POA: Diagnosis not present

## 2017-12-19 DIAGNOSIS — E1121 Type 2 diabetes mellitus with diabetic nephropathy: Secondary | ICD-10-CM | POA: Diagnosis not present

## 2017-12-19 LAB — HEMOGLOBIN A1C: Hemoglobin A1C: 6.7

## 2017-12-25 DIAGNOSIS — N183 Chronic kidney disease, stage 3 (moderate): Secondary | ICD-10-CM | POA: Diagnosis not present

## 2017-12-25 DIAGNOSIS — Z79899 Other long term (current) drug therapy: Secondary | ICD-10-CM | POA: Diagnosis not present

## 2017-12-25 DIAGNOSIS — I10 Essential (primary) hypertension: Secondary | ICD-10-CM | POA: Diagnosis not present

## 2017-12-25 DIAGNOSIS — R809 Proteinuria, unspecified: Secondary | ICD-10-CM | POA: Diagnosis not present

## 2017-12-25 DIAGNOSIS — D649 Anemia, unspecified: Secondary | ICD-10-CM | POA: Diagnosis not present

## 2017-12-31 DIAGNOSIS — N281 Cyst of kidney, acquired: Secondary | ICD-10-CM | POA: Diagnosis not present

## 2017-12-31 DIAGNOSIS — D638 Anemia in other chronic diseases classified elsewhere: Secondary | ICD-10-CM | POA: Diagnosis not present

## 2017-12-31 DIAGNOSIS — N183 Chronic kidney disease, stage 3 (moderate): Secondary | ICD-10-CM | POA: Diagnosis not present

## 2018-03-11 DIAGNOSIS — H35033 Hypertensive retinopathy, bilateral: Secondary | ICD-10-CM | POA: Diagnosis not present

## 2018-03-11 DIAGNOSIS — Z961 Presence of intraocular lens: Secondary | ICD-10-CM | POA: Diagnosis not present

## 2018-03-11 DIAGNOSIS — H26493 Other secondary cataract, bilateral: Secondary | ICD-10-CM | POA: Diagnosis not present

## 2018-03-11 DIAGNOSIS — H401132 Primary open-angle glaucoma, bilateral, moderate stage: Secondary | ICD-10-CM | POA: Diagnosis not present

## 2018-03-23 DIAGNOSIS — Z23 Encounter for immunization: Secondary | ICD-10-CM | POA: Diagnosis not present

## 2018-04-02 DIAGNOSIS — D509 Iron deficiency anemia, unspecified: Secondary | ICD-10-CM | POA: Diagnosis not present

## 2018-04-02 DIAGNOSIS — N183 Chronic kidney disease, stage 3 (moderate): Secondary | ICD-10-CM | POA: Diagnosis not present

## 2018-04-02 DIAGNOSIS — R809 Proteinuria, unspecified: Secondary | ICD-10-CM | POA: Diagnosis not present

## 2018-04-02 DIAGNOSIS — Z79899 Other long term (current) drug therapy: Secondary | ICD-10-CM | POA: Diagnosis not present

## 2018-04-02 DIAGNOSIS — E559 Vitamin D deficiency, unspecified: Secondary | ICD-10-CM | POA: Diagnosis not present

## 2018-04-08 DIAGNOSIS — N183 Chronic kidney disease, stage 3 (moderate): Secondary | ICD-10-CM | POA: Diagnosis not present

## 2018-04-08 DIAGNOSIS — N281 Cyst of kidney, acquired: Secondary | ICD-10-CM | POA: Diagnosis not present

## 2018-04-08 DIAGNOSIS — D638 Anemia in other chronic diseases classified elsewhere: Secondary | ICD-10-CM | POA: Diagnosis not present

## 2018-06-15 DIAGNOSIS — H35033 Hypertensive retinopathy, bilateral: Secondary | ICD-10-CM | POA: Diagnosis not present

## 2018-06-15 DIAGNOSIS — Z961 Presence of intraocular lens: Secondary | ICD-10-CM | POA: Diagnosis not present

## 2018-06-15 DIAGNOSIS — H26493 Other secondary cataract, bilateral: Secondary | ICD-10-CM | POA: Diagnosis not present

## 2018-06-15 DIAGNOSIS — H401132 Primary open-angle glaucoma, bilateral, moderate stage: Secondary | ICD-10-CM | POA: Diagnosis not present

## 2018-07-24 ENCOUNTER — Other Ambulatory Visit (HOSPITAL_COMMUNITY): Payer: Self-pay | Admitting: Nurse Practitioner

## 2018-07-24 DIAGNOSIS — C61 Malignant neoplasm of prostate: Secondary | ICD-10-CM

## 2018-07-27 ENCOUNTER — Other Ambulatory Visit: Payer: Self-pay

## 2018-07-27 NOTE — Patient Outreach (Signed)
[facility]) Care Management  07/27/2018  Curley Hogen 01/23/1935 [id number]   Medication Adherence call to Mr. Wilberth Damon Telephone call to Patient regarding Medication Adherence unable to reach patient left a message with patient's wife patient is due on Janumet 50/500 mg under [place].   [place] Management Direct Dial [phone]  Fax [phone] [email or web address]

## 2018-07-29 ENCOUNTER — Inpatient Hospital Stay (HOSPITAL_COMMUNITY): Payer: Medicare Other | Attending: Hematology

## 2018-07-29 ENCOUNTER — Other Ambulatory Visit: Payer: Self-pay

## 2018-07-29 DIAGNOSIS — N189 Chronic kidney disease, unspecified: Secondary | ICD-10-CM | POA: Insufficient documentation

## 2018-07-29 DIAGNOSIS — Z9079 Acquired absence of other genital organ(s): Secondary | ICD-10-CM | POA: Diagnosis not present

## 2018-07-29 DIAGNOSIS — C61 Malignant neoplasm of prostate: Secondary | ICD-10-CM | POA: Diagnosis present

## 2018-07-29 DIAGNOSIS — D631 Anemia in chronic kidney disease: Secondary | ICD-10-CM | POA: Insufficient documentation

## 2018-07-29 LAB — CBC WITH DIFFERENTIAL/PLATELET
Abs Immature Granulocytes: 0.04 10*3/uL (ref 0.00–0.07)
Basophils Absolute: 0 10*3/uL (ref 0.0–0.1)
Basophils Relative: 1 %
Eosinophils Absolute: 0.1 10*3/uL (ref 0.0–0.5)
Eosinophils Relative: 3 %
HCT: 36.4 % — ABNORMAL LOW (ref 39.0–52.0)
Hemoglobin: 12.6 g/dL — ABNORMAL LOW (ref 13.0–17.0)
Immature Granulocytes: 1 %
Lymphocytes Relative: 24 %
Lymphs Abs: 1.1 10*3/uL (ref 0.7–4.0)
MCH: 28 pg (ref 26.0–34.0)
MCHC: 34.6 g/dL (ref 30.0–36.0)
MCV: 80.9 fL (ref 80.0–100.0)
Monocytes Absolute: 0.4 10*3/uL (ref 0.1–1.0)
Monocytes Relative: 9 %
Neutro Abs: 2.9 10*3/uL (ref 1.7–7.7)
Neutrophils Relative %: 62 %
Platelets: 142 10*3/uL — ABNORMAL LOW (ref 150–400)
RBC: 4.5 MIL/uL (ref 4.22–5.81)
RDW: 12.8 % (ref 11.5–15.5)
WBC: 4.6 10*3/uL (ref 4.0–10.5)
nRBC: 0 % (ref 0.0–0.2)

## 2018-07-29 LAB — COMPREHENSIVE METABOLIC PANEL
ALT: 15 U/L (ref 0–44)
AST: 16 U/L (ref 15–41)
Albumin: 4.1 g/dL (ref 3.5–5.0)
Alkaline Phosphatase: 48 U/L (ref 38–126)
Anion gap: 9 (ref 5–15)
BUN: 26 mg/dL — ABNORMAL HIGH (ref 8–23)
CO2: 26 mmol/L (ref 22–32)
Calcium: 9.3 mg/dL (ref 8.9–10.3)
Chloride: 104 mmol/L (ref 98–111)
Creatinine, Ser: 1.96 mg/dL — ABNORMAL HIGH (ref 0.61–1.24)
GFR calc Af Amer: 36 mL/min — ABNORMAL LOW (ref >60)
GFR calc non Af Amer: 31 mL/min — ABNORMAL LOW (ref >60)
Glucose, Bld: 142 mg/dL — ABNORMAL HIGH (ref 70–99)
Potassium: 4.4 mmol/L (ref 3.5–5.1)
Sodium: 139 mmol/L (ref 135–145)
Total Bilirubin: 0.7 mg/dL (ref 0.3–1.2)
Total Protein: 7.2 g/dL (ref 6.5–8.1)

## 2018-07-29 LAB — LACTATE DEHYDROGENASE: LDH: 135 U/L (ref 98–192)

## 2018-07-29 LAB — PSA: Prostatic Specific Antigen: <0.01 ng/mL (ref 0.00–4.00)

## 2018-08-05 ENCOUNTER — Ambulatory Visit (HOSPITAL_COMMUNITY): Payer: Self-pay | Admitting: Hematology

## 2018-08-05 ENCOUNTER — Encounter (HOSPITAL_COMMUNITY): Payer: Self-pay | Admitting: Nurse Practitioner

## 2018-08-05 ENCOUNTER — Other Ambulatory Visit: Payer: Self-pay

## 2018-08-05 ENCOUNTER — Inpatient Hospital Stay (HOSPITAL_COMMUNITY): Payer: Medicare Other | Admitting: Nurse Practitioner

## 2018-08-05 DIAGNOSIS — N189 Chronic kidney disease, unspecified: Secondary | ICD-10-CM | POA: Diagnosis not present

## 2018-08-05 DIAGNOSIS — D631 Anemia in chronic kidney disease: Secondary | ICD-10-CM | POA: Diagnosis not present

## 2018-08-05 DIAGNOSIS — C61 Malignant neoplasm of prostate: Secondary | ICD-10-CM | POA: Diagnosis not present

## 2018-08-05 DIAGNOSIS — Z9079 Acquired absence of other genital organ(s): Secondary | ICD-10-CM

## 2018-08-05 NOTE — Patient Instructions (Signed)
[place] at [facility] Discharge Instructions  Follow up in 6 months with labs a few days prior.    Thank you for choosing [place] at [facility] to provide your oncology and hematology care.  To afford each patient quality time with our provider, please arrive at least 15 minutes before your scheduled appointment time.   If you have a lab appointment with the [facility] please come in thru the  Main Entrance and check in at the main information desk  You need to re-schedule your appointment should you arrive 10 or more minutes late.  We strive to give you quality time with our providers, and arriving late affects you and other patients whose appointments are after yours.  Also, if you no show three or more times for appointments you may be dismissed from the clinic at the providers discretion.     Again, thank you for choosing [facility].  Our hope is that these requests will decrease the amount of time that you wait before being seen by our physicians.       _____________________________________________________________  Should you have questions after your visit to [facility], please contact our office at [phone] between the hours of 8:00 a.m. and 4:30 p.m.  Voicemails left after 4:00 p.m. will not be returned until the following business day.  For prescription refill requests, have your pharmacy contact our office and allow 72 hours.    Cancer Center Support Programs:   > Cancer Support Group  2nd Tuesday of the month 1pm-2pm, Journey Room

## 2018-08-05 NOTE — Assessment & Plan Note (Addendum)
1.  Prostate cancer: - Gleason score of 5. -Patient is status post radiation by Dr. Valere Dross on 08/01/2010 through 09/14/2010 - Patient is status post radical prostatectomy in 06/2004 by Dr. Michela Pitcher. -PSA levels have all remained 0 since 2007. -Labs on 07/29/2018 showed PSA was 0. -He is advised to notify the office if he has any issues such as abdominal pain, weight loss, shortness of breath, or worsening bone pain prior to his return. - We will return to clinic in 6 months with labs a week before.  2.  Chronic kidney disease: - His creatinine stays around 1.5-2.0. -Labs on 07/29/2018 showed his creatinine 1.96. -He is being followed by nephrology.  3.  Anemia in the setting of CKD: - His hemoglobin usually runs from 11.8-13.0. - Labs on 07/29/2018 showed his hemoglobin at 12.6, platelets at 142. - He will continue to follow-up with nephrology.

## 2018-08-05 NOTE — Progress Notes (Signed)
[place], [place] [address]   CLINIC:  Medical Oncology/Hematology  PCP:  Sinda Du, [place] [address] [phone]   REASON FOR VISIT: Follow-up for prostate cancer  CURRENT THERAPY: Observation   INTERVAL HISTORY:  Richard Swanson 84 y.o. male returns for routine follow-up of prostate cancer. He is here today and has been doing well since his last visit. He has no complaints at this time. He denies any new abdominal or bone pain. Denies any nausea, vomiting, or diarrhea. Denies any new pains. Had not noticed any recent bleeding such as epistaxis, hematuria or hematochezia. Denies recent chest pain on exertion, shortness of breath on minimal exertion, pre-syncopal episodes, or palpitations. Denies any numbness or tingling in hands or feet. Denies any recent fevers, infections, or recent hospitalizations. Patient reports appetite at 100% and energy level at 100%. He is eating well and maintaining his weight at this time.    REVIEW OF SYSTEMS:  Review of Systems  All other systems reviewed and are negative.    PAST MEDICAL/SURGICAL HISTORY:  Past Medical History:  Diagnosis Date  . Cancer [facility])    Prostate  . Diabetes mellitus without complication ([place])   . Dizziness   . Hypertension   . Prostate cancer ([place]) 11/03/2010   dx 2006   Past Surgical History:  Procedure Laterality Date  . PROSTATE SURGERY       SOCIAL HISTORY:  Social History   Socioeconomic History  . Marital status: Married    Spouse name: Not on file  . Number of children: Not on file  . Years of education: Not on file  . Highest education level: Not on file  Occupational History  . Not on file  Social Needs  . Financial resource strain: Not on file  . Food insecurity:    Worry: Not on file    Inability: Not on file  . Transportation needs:    Medical: Not on file    Non-medical: Not on file  Tobacco Use  . Smoking status:  Never Smoker  . Smokeless tobacco: Never Used  Substance and Sexual Activity  . Alcohol use: No  . Drug use: No  . Sexual activity: Not on file  Lifestyle  . Physical activity:    Days per week: Not on file    Minutes per session: Not on file  . Stress: Not on file  Relationships  . Social connections:    Talks on phone: Not on file    Gets together: Not on file    Attends religious service: Not on file    Active member of club or organization: Not on file    Attends meetings of clubs or organizations: Not on file    Relationship status: Not on file  . Intimate partner violence:    Fear of current or ex partner: Not on file    Emotionally abused: Not on file    Physically abused: Not on file    Forced sexual activity: Not on file  Other Topics Concern  . Not on file  Social History Narrative  . Not on file    FAMILY HISTORY:  Family History  Problem Relation Age of Onset  . Stroke Father     CURRENT MEDICATIONS:  Outpatient Encounter Medications as of 08/05/2018  Medication Sig Note  . amLODipine (NORVASC) 2.5 MG tablet Take 2.5 mg by mouth daily.   . COMBIGAN 0.2-0.5 % ophthalmic solution 1 drop  every 12 (twelve) hours.    Marland Kitchen JANUMET 50-500 MG tablet  01/30/2016: Received from: External Pharmacy  . Multiple Vitamins-Minerals (CENTRUM SILVER PO) Take 1 capsule by mouth daily.     . tamsulosin (FLOMAX) 0.4 MG CAPS capsule Take 0.4 mg by mouth daily.    No facility-administered encounter medications on file as of 08/05/2018.     ALLERGIES:  No Known Allergies   PHYSICAL EXAM:  ECOG Performance status: 1  Vitals:   08/05/18 0917  BP: 133/60  Pulse: (!) 52  Resp: 20  Temp: 98 F (36.7 C)  SpO2: 100%   Filed Weights   08/05/18 0917  Weight: 184 lb 9.6 oz (83.7 kg)    Physical Exam Constitutional:      Appearance: Normal appearance. He is normal weight.  Cardiovascular:     Rate and Rhythm: Normal rate and regular rhythm.     Heart sounds: Normal heart  sounds.  Pulmonary:     Effort: Pulmonary effort is normal.     Breath sounds: Normal breath sounds.  Abdominal:     General: Abdomen is flat. Bowel sounds are normal.     Palpations: Abdomen is soft.  Musculoskeletal: Normal range of motion.  Skin:    General: Skin is warm and dry.  Neurological:     Mental Status: He is alert and oriented to person, place, and time. Mental status is at baseline.  Psychiatric:        Mood and Affect: Mood normal.        Behavior: Behavior normal.        Thought Content: Thought content normal.        Judgment: Judgment normal.      LABORATORY DATA:  I have reviewed the labs as listed.  CBC    Component Value Date/Time   WBC 4.6 07/29/2018 1006   RBC 4.50 07/29/2018 1006   HGB 12.6 (L) 07/29/2018 1006   HCT 36.4 (L) 07/29/2018 1006   PLT 142 (L) 07/29/2018 1006   MCV 80.9 07/29/2018 1006   MCH 28.0 07/29/2018 1006   MCHC 34.6 07/29/2018 1006   RDW 12.8 07/29/2018 1006   LYMPHSABS 1.1 07/29/2018 1006   MONOABS 0.4 07/29/2018 1006   EOSABS 0.1 07/29/2018 1006   BASOSABS 0.0 07/29/2018 1006   CMP Latest Ref Rng & Units 07/29/2018 07/23/2017 01/29/2017  Glucose 70 - 99 mg/dL 142(H) 132(H) 141(H)  BUN 8 - 23 mg/dL 26(H) 23(H) 18  Creatinine 0.61 - 1.24 mg/dL 1.96(H) 1.77(H) 1.74(H)  Sodium 135 - 145 mmol/L 139 142 136  Potassium 3.5 - 5.1 mmol/L 4.4 4.3 4.3  Chloride 98 - 111 mmol/L 104 105 102  CO2 22 - 32 mmol/L 26 25 27   Calcium 8.9 - 10.3 mg/dL 9.3 9.2 9.0  Total Protein 6.5 - 8.1 g/dL 7.2 7.4 7.0  Total Bilirubin 0.3 - 1.2 mg/dL 0.7 0.8 0.6  Alkaline Phos 38 - 126 U/L 48 49 50  AST 15 - 41 U/L 16 22 21   ALT 0 - 44 U/L 15 18 19      I personally performed a face-to-face visit.   ASSESSMENT & PLAN:   Prostate cancer ([place]) 1.  Prostate cancer: - Gleason score of 5. -Patient is status post radiation by Dr. Valere Dross on 08/01/2010 through 09/14/2010 - Patient is status post radical prostatectomy in 06/2004 by Dr. Michela Pitcher. -PSA levels  have all remained 0 since 2007. -Labs on 07/29/2018 showed PSA was 0. -He is advised to notify the office  if he has any issues such as abdominal pain, weight loss, shortness of breath, or worsening bone pain prior to his return. - We will return to clinic in 6 months with labs a week before.  2.  Chronic kidney disease: - His creatinine stays around 1.5-2.0. -Labs on 07/29/2018 showed his creatinine 1.96. -He is being followed by nephrology.  3.  Anemia in the setting of CKD: - His hemoglobin usually runs from 11.8-13.0. - Labs on 07/29/2018 showed his hemoglobin at 12.6, platelets at 142. - He will continue to follow-up with nephrology.      Orders placed this encounter:  Orders Placed This Encounter  Procedures  . Lactate dehydrogenase  . PSA  . CBC with Differential/Platelet  . Comprehensive metabolic panel  . VITAMIN D 25 Hydroxy (Vit-D Deficiency, Fractures)  . Vitamin B12  . Folate      Francene Finders, FNP-C [place] [phone]

## 2019-01-28 ENCOUNTER — Other Ambulatory Visit (HOSPITAL_COMMUNITY): Payer: Self-pay

## 2019-01-29 ENCOUNTER — Other Ambulatory Visit: Payer: Self-pay

## 2019-01-29 ENCOUNTER — Inpatient Hospital Stay (HOSPITAL_COMMUNITY): Payer: Medicare Other | Attending: Hematology

## 2019-01-29 DIAGNOSIS — Z7984 Long term (current) use of oral hypoglycemic drugs: Secondary | ICD-10-CM | POA: Insufficient documentation

## 2019-01-29 DIAGNOSIS — E1122 Type 2 diabetes mellitus with diabetic chronic kidney disease: Secondary | ICD-10-CM | POA: Diagnosis not present

## 2019-01-29 DIAGNOSIS — I129 Hypertensive chronic kidney disease with stage 1 through stage 4 chronic kidney disease, or unspecified chronic kidney disease: Secondary | ICD-10-CM | POA: Insufficient documentation

## 2019-01-29 DIAGNOSIS — Z79899 Other long term (current) drug therapy: Secondary | ICD-10-CM | POA: Insufficient documentation

## 2019-01-29 DIAGNOSIS — C61 Malignant neoplasm of prostate: Secondary | ICD-10-CM | POA: Diagnosis present

## 2019-01-29 DIAGNOSIS — D631 Anemia in chronic kidney disease: Secondary | ICD-10-CM | POA: Diagnosis not present

## 2019-01-29 DIAGNOSIS — N189 Chronic kidney disease, unspecified: Secondary | ICD-10-CM | POA: Diagnosis not present

## 2019-01-29 LAB — CBC WITH DIFFERENTIAL/PLATELET
Abs Immature Granulocytes: 0.03 10*3/uL (ref 0.00–0.07)
Basophils Absolute: 0 10*3/uL (ref 0.0–0.1)
Basophils Relative: 0 %
Eosinophils Absolute: 0 10*3/uL (ref 0.0–0.5)
Eosinophils Relative: 1 %
HCT: 35.4 % — ABNORMAL LOW (ref 39.0–52.0)
Hemoglobin: 12.1 g/dL — ABNORMAL LOW (ref 13.0–17.0)
Immature Granulocytes: 1 %
Lymphocytes Relative: 8 %
Lymphs Abs: 0.5 10*3/uL — ABNORMAL LOW (ref 0.7–4.0)
MCH: 27.6 pg (ref 26.0–34.0)
MCHC: 34.2 g/dL (ref 30.0–36.0)
MCV: 80.6 fL (ref 80.0–100.0)
Monocytes Absolute: 0.5 10*3/uL (ref 0.1–1.0)
Monocytes Relative: 8 %
Neutro Abs: 5.3 10*3/uL (ref 1.7–7.7)
Neutrophils Relative %: 82 %
Platelets: 157 10*3/uL (ref 150–400)
RBC: 4.39 MIL/uL (ref 4.22–5.81)
RDW: 13.2 % (ref 11.5–15.5)
WBC: 6.4 10*3/uL (ref 4.0–10.5)
nRBC: 0 % (ref 0.0–0.2)

## 2019-01-29 LAB — VITAMIN B12: Vitamin B-12: 846 pg/mL (ref 180–914)

## 2019-01-29 LAB — PSA: Prostatic Specific Antigen: <0.01 ng/mL (ref 0.00–4.00)

## 2019-01-29 LAB — COMPREHENSIVE METABOLIC PANEL
ALT: 16 U/L (ref 0–44)
AST: 17 U/L (ref 15–41)
Albumin: 4.1 g/dL (ref 3.5–5.0)
Alkaline Phosphatase: 49 U/L (ref 38–126)
Anion gap: 8 (ref 5–15)
BUN: 23 mg/dL (ref 8–23)
CO2: 26 mmol/L (ref 22–32)
Calcium: 8.8 mg/dL — ABNORMAL LOW (ref 8.9–10.3)
Chloride: 102 mmol/L (ref 98–111)
Creatinine, Ser: 1.98 mg/dL — ABNORMAL HIGH (ref 0.61–1.24)
GFR calc Af Amer: 35 mL/min — ABNORMAL LOW (ref >60)
GFR calc non Af Amer: 31 mL/min — ABNORMAL LOW (ref >60)
Glucose, Bld: 183 mg/dL — ABNORMAL HIGH (ref 70–99)
Potassium: 4.3 mmol/L (ref 3.5–5.1)
Sodium: 136 mmol/L (ref 135–145)
Total Bilirubin: 0.9 mg/dL (ref 0.3–1.2)
Total Protein: 7.3 g/dL (ref 6.5–8.1)

## 2019-01-29 LAB — FOLATE: Folate: 16 ng/mL (ref >5.9)

## 2019-01-29 LAB — VITAMIN D 25 HYDROXY (VIT D DEFICIENCY, FRACTURES): Vit D, 25-Hydroxy: 49.75 ng/mL (ref 30–100)

## 2019-01-29 LAB — LACTATE DEHYDROGENASE: LDH: 152 U/L (ref 98–192)

## 2019-02-04 ENCOUNTER — Encounter (HOSPITAL_COMMUNITY): Payer: Self-pay | Admitting: Nurse Practitioner

## 2019-02-04 ENCOUNTER — Other Ambulatory Visit: Payer: Self-pay

## 2019-02-04 ENCOUNTER — Inpatient Hospital Stay (HOSPITAL_BASED_OUTPATIENT_CLINIC_OR_DEPARTMENT_OTHER): Payer: Medicare Other | Admitting: Nurse Practitioner

## 2019-02-04 DIAGNOSIS — C61 Malignant neoplasm of prostate: Secondary | ICD-10-CM

## 2019-02-04 NOTE — Assessment & Plan Note (Addendum)
1.  Prostate cancer: - Gleason score of 5. -Patient is status post radiation by Dr. Valere Dross on 08/01/2010 through 09/14/2010 - Patient is status post radical prostatectomy in 06/2004 by Dr. Michela Pitcher. -PSA levels have all remained 0 since 2007. -Labs on 01/29/2019 showed PSA was 0.  Potassium 4.3, hemoglobin 12.1, WBC 6.4, platelets 157.  Alkaline phosphatase 49 -He is advised to notify the office if he has any issues such as abdominal pain, weight loss, shortness of breath, or worsening bone pain prior to his return. - We will return to clinic in 6 months with labs a week before.  2.  Chronic kidney disease: - His creatinine stays around 1.5-2.0. -Labs on 01/29/2019 showed his creatinine 1.98. -He is being followed by nephrology.  3.  Anemia in the setting of CKD: - His hemoglobin usually runs from 11.8-13.0. - Labs on 01/29/2019 showed his hemoglobin at 12.1, platelets at 157. - He will continue to follow-up with nephrology.

## 2019-02-04 NOTE — Progress Notes (Signed)
[place], [place] [address]   CLINIC:  Medical Oncology/Hematology  PCP:  Sinda Du, [place] [address] [phone]   REASON FOR VISIT: Follow-up for prostate cancer  CURRENT THERAPY: Observation   INTERVAL HISTORY:  [name] 84 y.o. male returns for routine follow-up for prostate cancer.  He reports he is doing well since his last visit.  He denies any new hip or bone pain he denies any abdominal pain.  He denies any bright red bleeding per rectum or melena. Denies any nausea, vomiting, or diarrhea. Denies any new pains. Had not noticed any recent bleeding such as epistaxis, hematuria or hematochezia. Denies recent chest pain on exertion, shortness of breath on minimal exertion, pre-syncopal episodes, or palpitations. Denies any numbness or tingling in hands or feet. Denies any recent fevers, infections, or recent hospitalizations. Patient reports appetite at 100% and energy level at 100%.  He is eating well maintain his weight at this time.   REVIEW OF SYSTEMS:  Review of Systems  All other systems reviewed and are negative.    PAST MEDICAL/SURGICAL HISTORY:  Past Medical History:  Diagnosis Date  . Cancer [facility])    Prostate  . Diabetes mellitus without complication ([place])   . Dizziness   . Hypertension   . Prostate cancer ([place]) 11/03/2010   dx 2006   Past Surgical History:  Procedure Laterality Date  . PROSTATE SURGERY       SOCIAL HISTORY:  Social History   Socioeconomic History  . Marital status: Married    Spouse name: Not on file  . Number of children: Not on file  . Years of education: Not on file  . Highest education level: Not on file  Occupational History  . Not on file  Social Needs  . Financial resource strain: Not on file  . Food insecurity    Worry: Not on file    Inability: Not on file  . Transportation needs    Medical: Not on file    Non-medical: Not on file   Tobacco Use  . Smoking status: Never Smoker  . Smokeless tobacco: Never Used  Substance and Sexual Activity  . Alcohol use: No  . Drug use: No  . Sexual activity: Not on file  Lifestyle  . Physical activity    Days per week: Not on file    Minutes per session: Not on file  . Stress: Not on file  Relationships  . Social Herbalist on phone: Not on file    Gets together: Not on file    Attends religious service: Not on file    Active member of club or organization: Not on file    Attends meetings of clubs or organizations: Not on file    Relationship status: Not on file  . Intimate partner violence    Fear of current or ex partner: Not on file    Emotionally abused: Not on file    Physically abused: Not on file    Forced sexual activity: Not on file  Other Topics Concern  . Not on file  Social History Narrative  . Not on file    FAMILY HISTORY:  Family History  Problem Relation Age of Onset  . Stroke Father     CURRENT MEDICATIONS:  Outpatient Encounter Medications as of 02/04/2019  Medication Sig Note  . amLODipine (NORVASC) 10 MG tablet Take 10 mg by mouth daily.   Marland Kitchen  COMBIGAN 0.2-0.5 % ophthalmic solution 1 drop every 12 (twelve) hours.    . metoprolol tartrate (LOPRESSOR) 25 MG tablet Take 25 mg by mouth daily.   . Multiple Vitamins-Minerals (CENTRUM SILVER PO) Take 1 capsule by mouth daily.     . sitaGLIPtin-metformin (JANUMET) 50-500 MG tablet Take 1 tablet by mouth 2 (two) times daily with a meal.   . tamsulosin (FLOMAX) 0.4 MG CAPS capsule Take 0.4 mg by mouth daily.   . [DISCONTINUED] amLODipine (NORVASC) 2.5 MG tablet Take 2.5 mg by mouth daily.   . [DISCONTINUED] JANUMET 50-500 MG tablet  01/30/2016: Received from: External Pharmacy   No facility-administered encounter medications on file as of 02/04/2019.     ALLERGIES:  No Known Allergies   PHYSICAL EXAM:  ECOG Performance status: 1  Vitals:   02/04/19 1002  BP: 137/60  Pulse: (!) 58   Resp: 18  Temp: (!) 97.5 F (36.4 C)  SpO2: 100%   Filed Weights   02/04/19 1002  Weight: 187 lb 11.2 oz (85.1 kg)    Physical Exam Constitutional:      Appearance: Normal appearance. He is normal weight.  Cardiovascular:     Rate and Rhythm: Normal rate and regular rhythm.     Heart sounds: Normal heart sounds.  Pulmonary:     Effort: Pulmonary effort is normal.     Breath sounds: Normal breath sounds.  Abdominal:     General: Bowel sounds are normal.     Palpations: Abdomen is soft.  Musculoskeletal: Normal range of motion.  Skin:    General: Skin is warm and dry.  Neurological:     Mental Status: He is alert and oriented to person, place, and time. Mental status is at baseline.  Psychiatric:        Mood and Affect: Mood normal.        Behavior: Behavior normal.        Thought Content: Thought content normal.        Judgment: Judgment normal.      LABORATORY DATA:  I have reviewed the labs as listed.  CBC    Component Value Date/Time   WBC 6.4 01/29/2019 1029   RBC 4.39 01/29/2019 1029   HGB 12.1 (L) 01/29/2019 1029   HCT 35.4 (L) 01/29/2019 1029   PLT 157 01/29/2019 1029   MCV 80.6 01/29/2019 1029   MCH 27.6 01/29/2019 1029   MCHC 34.2 01/29/2019 1029   RDW 13.2 01/29/2019 1029   LYMPHSABS 0.5 (L) 01/29/2019 1029   MONOABS 0.5 01/29/2019 1029   EOSABS 0.0 01/29/2019 1029   BASOSABS 0.0 01/29/2019 1029   CMP Latest Ref Rng & Units 01/29/2019 07/29/2018 07/23/2017  Glucose 70 - 99 mg/dL 183(H) 142(H) 132(H)  BUN 8 - 23 mg/dL 23 26(H) 23(H)  Creatinine 0.61 - 1.24 mg/dL 1.98(H) 1.96(H) 1.77(H)  Sodium 135 - 145 mmol/L 136 139 142  Potassium 3.5 - 5.1 mmol/L 4.3 4.4 4.3  Chloride 98 - 111 mmol/L 102 104 105  CO2 22 - 32 mmol/L 26 26 25   Calcium 8.9 - 10.3 mg/dL 8.8(L) 9.3 9.2  Total Protein 6.5 - 8.1 g/dL 7.3 7.2 7.4  Total Bilirubin 0.3 - 1.2 mg/dL 0.9 0.7 0.8  Alkaline Phos 38 - 126 U/L 49 48 49  AST 15 - 41 U/L 17 16 22   ALT 0 - 44 U/L 16 15 18        I personally performed a face-to-face visit.  All questions were answered to patient's stated  satisfaction. Encouraged patient to call with any new concerns or questions before his next visit to the cancer center and we can certain see him sooner, if needed.     ASSESSMENT & PLAN:   Prostate cancer ([place]) 1.  Prostate cancer: - Gleason score of 5. -Patient is status post radiation by Dr. Valere Dross on 08/01/2010 through 09/14/2010 - Patient is status post radical prostatectomy in 06/2004 by Dr. Michela Pitcher. -PSA levels have all remained 0 since 2007. -Labs on 01/29/2019 showed PSA was 0.  Potassium 4.3, hemoglobin 12.1, WBC 6.4, platelets 157.  Alkaline phosphatase 49 -He is advised to notify the office if he has any issues such as abdominal pain, weight loss, shortness of breath, or worsening bone pain prior to his return. - We will return to clinic in 6 months with labs a week before.  2.  Chronic kidney disease: - His creatinine stays around 1.5-2.0. -Labs on 01/29/2019 showed his creatinine 1.98. -He is being followed by nephrology.  3.  Anemia in the setting of CKD: - His hemoglobin usually runs from 11.8-13.0. - Labs on 01/29/2019 showed his hemoglobin at 12.1, platelets at 157. - He will continue to follow-up with nephrology.      Orders placed this encounter:  Orders Placed This Encounter  Procedures  . Lactate dehydrogenase  . PSA  . CBC with Differential/Platelet  . Comprehensive metabolic panel  . Vitamin B12  . VITAMIN D 25 Hydroxy (Vit-D Deficiency, Fractures)      Francene Finders, FNP-C [place] [phone]

## 2019-02-04 NOTE — Patient Instructions (Signed)
[facility] at [facility] Discharge Instructions  Follow up in 6 months with labs    Thank you for choosing [facility] at [facility] to provide your oncology and hematology care.  To afford each patient quality time with our provider, please arrive at least 15 minutes before your scheduled appointment time.   If you have a lab appointment with the Cancer Center please come in thru the Main Entrance and check in at the main information desk.  You need to re-schedule your appointment should you arrive 10 or more minutes late.  We strive to give you quality time with our providers, and arriving late affects you and other patients whose appointments are after yours.  Also, if you no show three or more times for appointments you may be dismissed from the clinic at the providers discretion.     Again, thank you for choosing [facility].  Our hope is that these requests will decrease the amount of time that you wait before being seen by our physicians.       _____________________________________________________________  Should you have questions after your visit to [facility], please contact our office at [phone] between the hours of 8:00 a.m. and 4:30 p.m.  Voicemails left after 4:00 p.m. will not be returned until the following business day.  For prescription refill requests, have your pharmacy contact our office and allow 72 hours.    Due to Covid, you will need to wear a mask upon entering the hospital. If you do not have a mask, a mask will be given to you at the Main Entrance upon arrival. For doctor visits, patients may have 1 support person with them. For treatment visits, patients can not have anyone with them due to social distancing guidelines and our immunocompromised population.      

## 2019-03-23 DIAGNOSIS — C61 Malignant neoplasm of prostate: Secondary | ICD-10-CM | POA: Insufficient documentation

## 2019-03-23 DIAGNOSIS — N183 Chronic kidney disease, stage 3 unspecified: Secondary | ICD-10-CM

## 2019-03-23 DIAGNOSIS — J449 Chronic obstructive pulmonary disease, unspecified: Secondary | ICD-10-CM | POA: Insufficient documentation

## 2019-03-23 DIAGNOSIS — K219 Gastro-esophageal reflux disease without esophagitis: Secondary | ICD-10-CM

## 2019-03-23 DIAGNOSIS — N19 Unspecified kidney failure: Secondary | ICD-10-CM | POA: Insufficient documentation

## 2019-03-23 DIAGNOSIS — N08 Glomerular disorders in diseases classified elsewhere: Secondary | ICD-10-CM | POA: Insufficient documentation

## 2019-03-23 DIAGNOSIS — I129 Hypertensive chronic kidney disease with stage 1 through stage 4 chronic kidney disease, or unspecified chronic kidney disease: Secondary | ICD-10-CM | POA: Insufficient documentation

## 2019-03-23 DIAGNOSIS — H409 Unspecified glaucoma: Secondary | ICD-10-CM | POA: Insufficient documentation

## 2019-03-23 DIAGNOSIS — E1121 Type 2 diabetes mellitus with diabetic nephropathy: Secondary | ICD-10-CM | POA: Insufficient documentation

## 2019-03-23 DIAGNOSIS — R310 Gross hematuria: Secondary | ICD-10-CM

## 2019-03-29 ENCOUNTER — Encounter: Payer: Self-pay | Admitting: "Endocrinology

## 2019-05-10 ENCOUNTER — Ambulatory Visit (INDEPENDENT_AMBULATORY_CARE_PROVIDER_SITE_OTHER): Payer: Medicare Other | Admitting: Family Medicine

## 2019-05-10 ENCOUNTER — Encounter: Payer: Self-pay | Admitting: Family Medicine

## 2019-05-10 ENCOUNTER — Other Ambulatory Visit: Payer: Self-pay

## 2019-05-10 VITALS — BP 152/70 | HR 54 | Temp 98.5°F | Ht 72.0 in | Wt 184.0 lb

## 2019-05-10 DIAGNOSIS — I152 Hypertension secondary to endocrine disorders: Secondary | ICD-10-CM | POA: Insufficient documentation

## 2019-05-10 DIAGNOSIS — E1121 Type 2 diabetes mellitus with diabetic nephropathy: Secondary | ICD-10-CM | POA: Diagnosis not present

## 2019-05-10 DIAGNOSIS — J449 Chronic obstructive pulmonary disease, unspecified: Secondary | ICD-10-CM | POA: Diagnosis not present

## 2019-05-10 DIAGNOSIS — K219 Gastro-esophageal reflux disease without esophagitis: Secondary | ICD-10-CM | POA: Diagnosis not present

## 2019-05-10 DIAGNOSIS — E1159 Type 2 diabetes mellitus with other circulatory complications: Secondary | ICD-10-CM | POA: Insufficient documentation

## 2019-05-10 DIAGNOSIS — C61 Malignant neoplasm of prostate: Secondary | ICD-10-CM | POA: Diagnosis not present

## 2019-05-10 DIAGNOSIS — N183 Chronic kidney disease, stage 3 unspecified: Secondary | ICD-10-CM

## 2019-05-10 DIAGNOSIS — I10 Essential (primary) hypertension: Secondary | ICD-10-CM

## 2019-05-10 MED ORDER — FAMOTIDINE 20 MG PO TABS: 20.0000 mg | ORAL_TABLET | Freq: Two times a day (BID) | ORAL | 1 refills | Status: DC

## 2019-05-10 NOTE — Progress Notes (Addendum)
New Patient Office Visit  Subjective:  Patient ID: Richard Swanson, male    DOB: 04/24/1935  Age: 85 y.o. MRN: [id number]  REVIEW of SPECIALTY NOTE Oncology note reviewed Prostate cancer ([place]) 1.  Prostate cancer: - Gleason score of 5. -Patient is status post radiation by Dr. Valere Dross on 08/01/2010 through 09/14/2010 - Patient is status post radical prostatectomy in 06/2004 by Dr. Michela Pitcher. -PSA levels have all remained 0 since 2007. -Labs on 01/29/2019 showed PSA was 0.  Potassium 4.3, hemoglobin 12.1, WBC 6.4, platelets 157.  Alkaline phosphatase 49 -He is advised to notify the office if he has any issues such as abdominal pain, weight loss, shortness of breath, or worsening bone pain prior to his return. - We will return to clinic in 6 months with labs a week before.  2.  Chronic kidney disease: - His creatinine stays around 1.5-2.0. -Labs on 01/29/2019 showed his creatinine 1.98. -He is being followed by nephrology.  3.  Anemia in the setting of CKD: - His hemoglobin usually runs from 11.8-13.0. - Labs on 01/29/2019 showed his hemoglobin at 12.1, platelets at 157. - He will continue to follow-up with nephrology.  Nephrology noted [facility] Sodium 140 Potassium 4.4 Co2 22 Hgb 12.7-L Pth-I 40 Phos 3.6 Calcium 9.6 Albumin 3.6 Vitamin D 37.9<==28.3 Creat Trend 2020 1.7--2.1 2019 1.8--2.0 2011 2.1 Urine Spot Protein/creat ratio 210 mg Auto immune work up In 2019 ANCA profile-neg ANA-neg C3/C4/CH50- not low 24 hour Urine M spike neg SPEP-M spike-neg Renal U/s Right Kidney 9.3 Left kidney 10.2 Hydro no Mass no Cyst present Impression: 1)Renal  CKD stage 3b . CKD since 2011 CKD secondary to DIABETES MELLITUS  Possible contribution from hypertension / age associated decline Progression of CKD slow Hematuria none. Nephrolithiasis Absent   CC: prostate cancer/CKD/DM-chronic concerns-previous pt of Dr. Luan Pulling Chief Complaint  Patient presents with  .  Establish Care  . Tinnitus    left ear    HPI Richard Swanson presents for DM-elevated Cr-continued on Janumet-pt does not want insulin.  sees nephrology. Pt with regular diet-low carb. Pt states no increase in urination, visual change-glaucoma. GERD-pt needs medication for symptoms-stopped protonix . CKD   Past Medical History:  Diagnosis Date  . [facility] [address])    Prostate  . Chronic obstructive pulmonary disease, unspecified ([place])   . Diabetes mellitus without complication ([place])   . Dizziness   . Gastro-esophageal reflux disease without esophagitis   . Glaucoma   . Glomerular disorders in diseases classified elsewhere   . Gross hematuria   . Hypertension   . Hypertensive chronic kidney disease with stage 1 through stage 4 chronic kidney disease, or unspecified chronic kidney disease   . Kidney disease, chronic, stage III (moderate, EGFR 30-59 ml/min)   . Malignant neoplasm of prostate ([place])   . Prostate cancer ([place]) 11/03/2010   dx 2006  . Type 2 diabetes mellitus with diabetic nephropathy ([place])   . Unspecified kidney failure     Past Surgical History:  Procedure Laterality Date  . PROSTATE SURGERY      Family History  Problem Relation Age of Onset  . Stroke Father     Social History   Socioeconomic History  . Marital status: Married    Spouse name: Not on file  . Number of children: Not on file  . Years of education: Not on file  . Highest education level: Not on file  Occupational History  . Not on file  Tobacco Use  . Smoking status:  Never Smoker  . Smokeless tobacco: Never Used  Substance and Sexual Activity  . Alcohol use: No  . Drug use: No  . Sexual activity: Not on file  Other Topics Concern  . Not on file  Social History Narrative  . Not on file   Social Determinants of Health   Financial Resource Strain:   . Difficulty of Paying Living Expenses: Not on file  Food Insecurity:   . Worried About Charity fundraiser in the Last Year: Not on  file  . Ran Out of Food in the Last Year: Not on file  Transportation Needs:   . Lack of Transportation (Medical): Not on file  . Lack of Transportation (Non-Medical): Not on file  Physical Activity:   . Days of Exercise per Week: Not on file  . Minutes of Exercise per Session: Not on file  Stress:   . Feeling of Stress : Not on file  Social Connections:   . Frequency of Communication with Friends and Family: Not on file  . Frequency of Social Gatherings with Friends and Family: Not on file  . Attends Religious Services: Not on file  . Active Member of Clubs or Organizations: Not on file  . Attends Archivist Meetings: Not on file  . Marital Status: Not on file  Intimate Partner Violence:   . Fear of Current or Ex-Partner: Not on file  . Emotionally Abused: Not on file  . Physically Abused: Not on file  . Sexually Abused: Not on file    ROS Review of Systems  Constitutional: Negative.   HENT: Positive for tinnitus.   Eyes:       Cataracts  Cardiovascular: Negative.   Gastrointestinal: Negative.   Endocrine:       DM  Genitourinary:       CKD Prostate CA  Musculoskeletal: Negative.   Allergic/Immunologic: Negative.   Neurological: Negative.   Hematological: Negative.   Psychiatric/Behavioral: Negative.     Objective:   Today's Vitals: BP (!) 152/70 (BP Location: Left Arm, Patient Position: Sitting, Cuff Size: Normal)   Pulse (!) 54   Temp 98.5 F (36.9 C) (Oral)   Ht 6' (1.829 m)   Wt 184 lb (83.5 kg)   SpO2 98%   BMI 24.95 kg/m   Physical Exam Constitutional:      Appearance: Normal appearance. He is normal weight.  HENT:     Head: Normocephalic and atraumatic.     Ears:     Comments: Right cerumen     Nose: Nose normal.     Mouth/Throat:     Mouth: Mucous membranes are moist.  Eyes:     Conjunctiva/sclera: Conjunctivae normal.  Cardiovascular:     Rate and Rhythm: Normal rate and regular rhythm.     Pulses: Normal pulses.     Heart  sounds: Normal heart sounds.  Pulmonary:     Effort: Pulmonary effort is normal.     Breath sounds: Normal breath sounds.  Musculoskeletal:     Cervical back: Normal range of motion and neck supple.  Neurological:     Mental Status: He is alert and oriented to person, place, and time.  Psychiatric:        Mood and Affect: Mood normal.        Behavior: Behavior normal.     Assessment & Plan:  1. Type 2 diabetes mellitus with diabetic nephropathy, without long-term current use of insulin (HCC) Elevated Cr-taking metformin +Januvia-d/w pt CR concerning with  metformin-discussed additional medication for control with renal concerns. Pt agreed to discuss with Dr. Dorris Fetch - Ambulatory referral to Endocrinology  2. Gastro-esophageal reflux disease without esophagitis Start Pepcid BID-after Protonix pt with return of symptoms  3. Chronic obstructive pulmonary disease, unspecified COPD type ([place]) No concerns-stopped smoking 4. Prostate cancer [facility]) Sees oncology  5. Stage 3 chronic kidney disease, unspecified whether stage 3a or 3b CKD Sees nephrology  HTN-amlodipine am, metoprolol pm-divide the meds for improved control Outpatient Encounter Medications as of 05/10/2019  Medication Sig  . amLODipine (NORVASC) 10 MG tablet Take 10 mg by mouth daily.  . COMBIGAN 0.2-0.5 % ophthalmic solution 1 drop every 12 (twelve) hours.   Marland Kitchen lisinopril (ZESTRIL) 10 MG tablet Take 10 mg by mouth daily.  . sitaGLIPtin-metformin (JANUMET) 50-500 MG tablet Take 1 tablet by mouth 2 (two) times daily with a meal.  . [DISCONTINUED] metoprolol tartrate (LOPRESSOR) 25 MG tablet Take 25 mg by mouth daily.  . [DISCONTINUED] Multiple Vitamins-Minerals (CENTRUM SILVER PO) Take 1 capsule by mouth daily.    . [DISCONTINUED] pantoprazole (PROTONIX) 40 MG tablet Take 40 mg by mouth daily.  . [DISCONTINUED] tamsulosin (FLOMAX) 0.4 MG CAPS capsule Take 0.4 mg by mouth daily.   No facility-administered encounter medications  on file as of 05/10/2019.   45 minutes spent with pt reviewing specialist evaluations, history, physical exam, assessment and plan.  Follow-up: 1 week-bp readings/glucose readings  Jace Dowe Hannah Beat, MD

## 2019-05-10 NOTE — Patient Instructions (Addendum)
Take blood pressure at home-write down readings Take metoprolol at NIGHT Take amlodipine in the MORNING ENDOCRINOLOGY REFERRAL Check fasting glucose and write down number Check after eating glucose at night and write down number ........................................................................................................................................................Marland Kitchen  Tinnitus Tinnitus refers to hearing a sound when there is no actual source for that sound. This is often described as ringing in the ears. However, people with this condition may hear a variety of noises, in one ear or in both ears. The sounds of tinnitus can be soft, loud, or somewhere in between. Tinnitus can last for a few seconds or can be constant for days. It may go away without treatment and come back at various times. When tinnitus is constant or happens often, it can lead to other problems, such as trouble sleeping and trouble concentrating. Almost everyone experiences tinnitus at some point. Tinnitus that is long-lasting (chronic) or comes back often (recurs) may require medical attention. What are the causes? The cause of tinnitus is often not known. In some cases, it can result from:  Exposure to loud noises from machinery, music, or other sources.  An object (foreign body) stuck in the ear.  Earwax buildup.  Drinking alcohol or caffeine.  Taking certain medicines.  Age-related hearing loss. It may also be caused by medical conditions such as:  Ear or sinus infections.  High blood pressure.  Heart diseases.  Anemia.  Allergies.  Meniere's disease.  Thyroid problems.  Tumors.  A weak, bulging blood vessel (aneurysm) near the ear. What are the signs or symptoms? The main symptom of tinnitus is hearing a sound when there is no source for that sound. It may sound like:  Buzzing.  Roaring.  Ringing.  Blowing air.  Hissing.  Whistling.  Sizzling.  Humming.  Running  water.  A musical note.  Tapping. Symptoms may affect only one ear (unilateral) or both ears (bilateral). How is this diagnosed? Tinnitus is diagnosed based on your symptoms, your medical history, and a physical exam. Your health care provider may do a thorough hearing test (audiologic exam) if your tinnitus:  Is unilateral.  Causes hearing difficulties.  Lasts 6 months or longer. You may work with a health care provider who specializes in hearing disorders (audiologist). You may be asked questions about your symptoms and how they affect your daily life. You may have other tests done, such as:  CT scan.  MRI.  An imaging test of how blood flows through your blood vessels (angiogram). How is this treated? Treating an underlying medical condition can sometimes make tinnitus go away. If your tinnitus continues, other treatments may include:  Medicines.  Therapy and counseling to help you manage the stress of living with tinnitus.  Sound generators to mask the tinnitus. These include: ? Tabletop sound machines that play relaxing sounds to help you fall asleep. ? Wearable devices that fit in your ear and play sounds or music. ? Acoustic neural stimulation. This involves using headphones to listen to music that contains an auditory signal. Over time, listening to this signal may change some pathways in your brain and make you less sensitive to tinnitus. This treatment is used for very severe cases when no other treatment is working.  Using hearing aids or cochlear implants if your tinnitus is related to hearing loss. Hearing aids are worn in the outer ear. Cochlear implants are surgically placed in the inner ear. Follow these instructions at home: Managing symptoms      When possible, avoid being in loud places and  being exposed to loud sounds.  Wear hearing protection, such as earplugs, when you are exposed to loud noises.  Use a white noise machine, a humidifier, or other  devices to mask the sound of tinnitus.  Practice techniques for reducing stress, such as meditation, yoga, or deep breathing. Work with your health care provider if you need help with managing stress.  Sleep with your head slightly raised. This may reduce the impact of tinnitus. General instructions  Do not use stimulants, such as nicotine, alcohol, or caffeine. Talk with your health care provider about other stimulants to avoid. Stimulants are substances that can make you feel alert and attentive by increasing certain activities in the body (such as heart rate and blood pressure). These substances may make tinnitus worse.  Take over-the-counter and prescription medicines only as told by your health care provider.  Try to get plenty of sleep each night.  Keep all follow-up visits as told by your health care provider. This is important. Contact a health care provider if:  Your tinnitus continues for 3 weeks or longer without stopping.  You develop sudden hearing loss.  Your symptoms get worse or do not get better with home care.  You feel you are not able to manage the stress of living with tinnitus. Get help right away if:  You develop tinnitus after a head injury.  You have tinnitus along with any of the following: ? Dizziness. ? Loss of balance. ? Nausea and vomiting. ? Sudden, severe headache. These symptoms may represent a serious problem that is an emergency. Do not wait to see if the symptoms will go away. Get medical help right away. Call your local emergency services (911 in the [place] Do not drive yourself to the hospital. Summary  Tinnitus refers to hearing a sound when there is no actual source for that sound. This is often described as ringing in the ears.  Symptoms may affect only one ear (unilateral) or both ears (bilateral).  Use a white noise machine, a humidifier, or other devices to mask the sound of tinnitus.  Do not use stimulants, such as nicotine,  alcohol, or caffeine. Talk with your health care provider about other stimulants to avoid. These substances may make tinnitus worse. This information is not intended to replace advice given to you by your health care provider. Make sure you discuss any questions you have with your health care provider. Document Revised: 10/28/2018 Document Reviewed: 01/23/2017 Elsevier Patient Education  2020 [employer].

## 2019-05-14 LAB — CBC AND DIFFERENTIAL
HCT: 37 — AB (ref 41–53)
Hemoglobin: 13.1 — AB (ref 13.5–17.5)
Platelets: 210 (ref 150–399)
WBC: 5.3

## 2019-05-14 LAB — IRON,TIBC AND FERRITIN PANEL
Ferritin: 190
Iron: 96
TIBC: 273
UIBC: 177

## 2019-05-14 LAB — BASIC METABOLIC PANEL
BUN: 26 — AB (ref 4–21)
CO2: 21 (ref 13–22)
Chloride: 103 (ref 99–108)
Creatinine: 2.4 — AB (ref <=1.3)
Glucose: 178
Potassium: 4 (ref 3.4–5.3)
Sodium: 140 (ref 137–147)

## 2019-05-14 LAB — COMPREHENSIVE METABOLIC PANEL
Albumin: 4.8 (ref 3.5–5.0)
Calcium: 9.7 (ref 8.7–10.7)
GFR calc non Af Amer: 24

## 2019-05-14 LAB — CBC: RBC: 4.66 (ref 3.87–5.11)

## 2019-05-18 ENCOUNTER — Ambulatory Visit (INDEPENDENT_AMBULATORY_CARE_PROVIDER_SITE_OTHER): Payer: Medicare Other | Admitting: Family Medicine

## 2019-05-18 ENCOUNTER — Other Ambulatory Visit: Payer: Self-pay

## 2019-05-18 ENCOUNTER — Encounter: Payer: Self-pay | Admitting: Family Medicine

## 2019-05-18 VITALS — BP 120/60 | HR 55 | Temp 98.5°F | Ht 72.0 in | Wt 181.2 lb

## 2019-05-18 DIAGNOSIS — I129 Hypertensive chronic kidney disease with stage 1 through stage 4 chronic kidney disease, or unspecified chronic kidney disease: Secondary | ICD-10-CM | POA: Diagnosis not present

## 2019-05-18 DIAGNOSIS — E1121 Type 2 diabetes mellitus with diabetic nephropathy: Secondary | ICD-10-CM

## 2019-05-18 DIAGNOSIS — H409 Unspecified glaucoma: Secondary | ICD-10-CM | POA: Diagnosis not present

## 2019-05-18 DIAGNOSIS — K219 Gastro-esophageal reflux disease without esophagitis: Secondary | ICD-10-CM | POA: Diagnosis not present

## 2019-05-18 NOTE — Patient Instructions (Addendum)
Diabetes Mellitus and Foot Care Foot care is an important part of your health, especially when you have diabetes. Diabetes may cause you to have problems because of poor blood flow (circulation) to your feet and legs, which can cause your skin to:  Become thinner and drier.  Break more easily.  Heal more slowly.  Peel and crack. You may also have nerve damage (neuropathy) in your legs and feet, causing decreased feeling in them. This means that you may not notice minor injuries to your feet that could lead to more serious problems. Noticing and addressing any potential problems early is the best way to prevent future foot problems. How to care for your feet Foot hygiene  Wash your feet daily with warm water and mild soap. Do not use hot water. Then, pat your feet and the areas between your toes until they are completely dry. Do not soak your feet as this can dry your skin.  Trim your toenails straight across. Do not dig under them or around the cuticle. File the edges of your nails with an emery board or nail file.  Apply a moisturizing lotion or petroleum jelly to the skin on your feet and to dry, brittle toenails. Use lotion that does not contain alcohol and is unscented. Do not apply lotion between your toes. Shoes and socks  Wear clean socks or stockings every day. Make sure they are not too tight. Do not wear knee-high stockings since they may decrease blood flow to your legs.  Wear shoes that fit properly and have enough cushioning. Always look in your shoes before you put them on to be sure there are no objects inside.  To break in new shoes, wear them for just a few hours a day. This prevents injuries on your feet. Wounds, scrapes, corns, and calluses  Check your feet daily for blisters, cuts, bruises, sores, and redness. If you cannot see the bottom of your feet, use a mirror or ask someone for help.  Do not cut corns or calluses or try to remove them with medicine.  If you  find a minor scrape, cut, or break in the skin on your feet, keep it and the skin around it clean and dry. You may clean these areas with mild soap and water. Do not clean the area with peroxide, alcohol, or iodine.  If you have a wound, scrape, corn, or callus on your foot, look at it several times a day to make sure it is healing and not infected. Check for: ? Redness, swelling, or pain. ? Fluid or blood. ? Warmth. ? Pus or a bad smell. General instructions  Do not cross your legs. This may decrease blood flow to your feet.  Do not use heating pads or hot water bottles on your feet. They may burn your skin. If you have lost feeling in your feet or legs, you may not know this is happening until it is too late.  Protect your feet from hot and cold by wearing shoes, such as at the beach or on hot pavement.  Schedule a complete foot exam at least once a year (annually) or more often if you have foot problems. If you have foot problems, report any cuts, sores, or bruises to your health care provider immediately. Contact a health care provider if:  You have a medical condition that increases your risk of infection and you have any cuts, sores, or bruises on your feet.  You have an injury that is not   healing.  You have redness on your legs or feet.  You feel burning or tingling in your legs or feet.  You have pain or cramps in your legs and feet.  Your legs or feet are numb.  Your feet always feel cold.  You have pain around a toenail. Get help right away if:  You have a wound, scrape, corn, or callus on your foot and: ? You have pain, swelling, or redness that gets worse. ? You have fluid or blood coming from the wound, scrape, corn, or callus. ? Your wound, scrape, corn, or callus feels warm to the touch. ? You have pus or a bad smell coming from the wound, scrape, corn, or callus. ? You have a fever. ? You have a red line going up your leg. Summary  Check your feet every day  for cuts, sores, red spots, swelling, and blisters.  Moisturize feet and legs daily.  Wear shoes that fit properly and have enough cushioning.  If you have foot problems, report any cuts, sores, or bruises to your health care provider immediately.  Schedule a complete foot exam at least once a year (annually) or more often if you have foot problems. This information is not intended to replace advice given to you by your health care provider. Make sure you discuss any questions you have with your health care provider. Document Revised: 01/06/2019 Document Reviewed: 05/17/2016 Elsevier Patient Education  [place].  Diabetes Mellitus and Nutrition, Adult When you have diabetes (diabetes mellitus), it is very important to have healthy eating habits because your blood sugar (glucose) levels are greatly affected by what you eat and drink. Eating healthy foods in the appropriate amounts, at about the same times every day, can help you:  Control your blood glucose.  Lower your risk of heart disease.  Improve your blood pressure.  Reach or maintain a healthy weight. Every person with diabetes is different, and each person has different needs for a meal plan. Your health care provider may recommend that you work with a diet and nutrition specialist (dietitian) to make a meal plan that is best for you. Your meal plan may vary depending on factors such as:  The calories you need.  The medicines you take.  Your weight.  Your blood glucose, blood pressure, and cholesterol levels.  Your activity level.  Other health conditions you have, such as heart or kidney disease. How do carbohydrates affect me? Carbohydrates, also called carbs, affect your blood glucose level more than any other type of food. Eating carbs naturally raises the amount of glucose in your blood. Carb counting is a method for keeping track of how many carbs you eat. Counting carbs is important to keep your blood  glucose at a healthy level, especially if you use insulin or take certain oral diabetes medicines. It is important to know how many carbs you can safely have in each meal. This is different for every person. Your dietitian can help you calculate how many carbs you should have at each meal and for each snack. Foods that contain carbs include:  Bread, cereal, rice, pasta, and crackers.  Potatoes and corn.  Peas, beans, and lentils.  Milk and yogurt.  Fruit and juice.  Desserts, such as cakes, cookies, ice cream, and candy. How does alcohol affect me? Alcohol can cause a sudden decrease in blood glucose (hypoglycemia), especially if you use insulin or take certain oral diabetes medicines. Hypoglycemia can be a life-threatening condition. Symptoms of  hypoglycemia (sleepiness, dizziness, and confusion) are similar to symptoms of having too much alcohol. If your health care provider says that alcohol is safe for you, follow these guidelines:  Limit alcohol intake to no more than 1 drink per day for nonpregnant women and 2 drinks per day for men. One drink equals 12 oz of beer, 5 oz of wine, or 1 oz of hard liquor.  Do not drink on an empty stomach.  Keep yourself hydrated with water, diet soda, or unsweetened iced tea.  Keep in mind that regular soda, juice, and other mixers may contain a lot of sugar and must be counted as carbs. What are tips for following this plan?  Reading food labels  Start by checking the serving size on the "Nutrition Facts" label of packaged foods and drinks. The amount of calories, carbs, fats, and other nutrients listed on the label is based on one serving of the item. Many items contain more than one serving per package.  Check the total grams (g) of carbs in one serving. You can calculate the number of servings of carbs in one serving by dividing the total carbs by 15. For example, if a food has 30 g of total carbs, it would be equal to 2 servings of  carbs.  Check the number of grams (g) of saturated and trans fats in one serving. Choose foods that have low or no amount of these fats.  Check the number of milligrams (mg) of salt (sodium) in one serving. Most people should limit total sodium intake to less than 2,300 mg per day.  Always check the nutrition information of foods labeled as "low-fat" or "nonfat". These foods may be higher in added sugar or refined carbs and should be avoided.  Talk to your dietitian to identify your daily goals for nutrients listed on the label. Shopping  Avoid buying canned, premade, or processed foods. These foods tend to be high in fat, sodium, and added sugar.  Shop around the outside edge of the grocery store. This includes fresh fruits and vegetables, bulk grains, fresh meats, and fresh dairy. Cooking  Use low-heat cooking methods, such as baking, instead of high-heat cooking methods like deep frying.  Cook using healthy oils, such as olive, canola, or sunflower oil.  Avoid cooking with butter, cream, or high-fat meats. Meal planning  Eat meals and snacks regularly, preferably at the same times every day. Avoid going long periods of time without eating.  Eat foods high in fiber, such as fresh fruits, vegetables, beans, and whole grains. Talk to your dietitian about how many servings of carbs you can eat at each meal.  Eat 4-6 ounces (oz) of lean protein each day, such as lean meat, chicken, fish, eggs, or tofu. One oz of lean protein is equal to: ? 1 oz of meat, chicken, or fish. ? 1 egg. ?  cup of tofu.  Eat some foods each day that contain healthy fats, such as avocado, nuts, seeds, and fish. Lifestyle  Check your blood glucose regularly.  Exercise regularly as told by your health care provider. This may include: ? 150 minutes of moderate-intensity or vigorous-intensity exercise each week. This could be brisk walking, biking, or water aerobics. ? Stretching and doing strength  exercises, such as yoga or weightlifting, at least 2 times a week.  Take medicines as told by your health care provider.  Do not use any products that contain nicotine or tobacco, such as cigarettes and e-cigarettes. If you need help  quitting, ask your health care provider.  Work with a Social worker or diabetes educator to identify strategies to manage stress and any emotional and social challenges. Questions to ask a health care provider  Do I need to meet with a diabetes educator?  Do I need to meet with a dietitian?  What number can I call if I have questions?  When are the best times to check my blood glucose? Where to find more information:  American Diabetes Association: diabetes.org  Academy of Nutrition and Dietetics: www.eatright.[employer] of Diabetes and Digestive and Kidney Diseases (NIH): [employer] Summary  A healthy meal plan will help you control your blood glucose and maintain a healthy lifestyle.  Working with a diet and nutrition specialist (dietitian) can help you make a meal plan that is best for you.  Keep in mind that carbohydrates (carbs) and alcohol have immediate effects on your blood glucose levels. It is important to count carbs and to use alcohol carefully. This information is not intended to replace advice given to you by your health care provider. Make sure you discuss any questions you have with your health care provider. Document Revised: 03/28/2017 Document Reviewed: 05/20/2016 Elsevier Patient Education  2020 [facility]

## 2019-05-18 NOTE — Progress Notes (Signed)
Established Patient Office Visit  Subjective:  Patient ID: Richard Swanson, male    DOB: [birth year]  Age: 85 y.o. MRN: [id number]  CC:  Chief Complaint  Patient presents with  . Diabetes    follow up  . Hypertension    follow up    HPI [name] presents for HTN-switched medication to am/pm-bp readings reviewed-improved with switch.   Glucose readings reviewed-glucose on current medications stable-no lows/highs reported. Pt seeing eye doctor in [place]-glaucoma-q 13month No podiatry currently . See nephro for close f/u on low GFR, high Cr-decision on keeping metformin vs transition to another medication due to concerns about side effects  Past Medical History:  Diagnosis Date  . Cancer ([place]    Prostate  . Chronic obstructive pulmonary disease, unspecified ([place]   . Diabetes mellitus without complication ([place]   . Dizziness   . Gastro-esophageal reflux disease without esophagitis   . Glaucoma   . Glomerular disorders in diseases classified elsewhere   . Gross hematuria   . Hypertension   . Hypertensive chronic kidney disease with stage 1 through stage 4 chronic kidney disease, or unspecified chronic kidney disease   . Kidney disease, chronic, stage III (moderate, EGFR 30-59 ml/min)   . Malignant neoplasm of prostate ([place]   . Prostate cancer ([place] 11/03/2010   dx 2006  . Type 2 diabetes mellitus with diabetic nephropathy ([place]   . Unspecified kidney failure     Past Surgical History:  Procedure Laterality Date  . PROSTATE SURGERY      Family History  Problem Relation Age of Onset  . Stroke Father     Social History   Socioeconomic History  . Marital status: Married    Spouse name: Not on file  . Number of children: Not on file  . Years of education: Not on file  . Highest education level: Not on file  Occupational History  . Not on file  Tobacco Use  . Smoking status: Never Smoker  . Smokeless tobacco: Never Used  Substance and Sexual Activity  .  Alcohol use: No  . Drug use: No  . Sexual activity: Not on file  Other Topics Concern  . Not on file  Social History Narrative  . Not on file   Social Determinants of Health   Financial Resource Strain:   . Difficulty of Paying Living Expenses: Not on file  Food Insecurity:   . Worried About RCharity fundraiserin the Last Year: Not on file  . Ran Out of Food in the Last Year: Not on file  Transportation Needs:   . Lack of Transportation (Medical): Not on file  . Lack of Transportation (Non-Medical): Not on file  Physical Activity:   . Days of Exercise per Week: Not on file  . Minutes of Exercise per Session: Not on file  Stress:   . Feeling of Stress : Not on file  Social Connections:   . Frequency of Communication with Friends and Family: Not on file  . Frequency of Social Gatherings with Friends and Family: Not on file  . Attends Religious Services: Not on file  . Active Member of Clubs or Organizations: Not on file  . Attends CArchivistMeetings: Not on file  . Marital Status: Not on file  Intimate Partner Violence:   . Fear of Current or Ex-Partner: Not on file  . Emotionally Abused: Not on file  . Physically Abused: Not on file  . Sexually Abused: Not on file  Outpatient Medications Prior to Visit  Medication Sig Dispense Refill  . amLODipine (NORVASC) 10 MG tablet Take 10 mg by mouth daily.    . COMBIGAN 0.2-0.5 % ophthalmic solution 1 drop every 12 (twelve) hours.     . famotidine (PEPCID) 20 MG tablet Take 1 tablet (20 mg total) by mouth 2 (two) times daily. 60 tablet 1  . lisinopril (ZESTRIL) 10 MG tablet Take 10 mg by mouth daily.    . sitaGLIPtin-metformin (JANUMET) 50-500 MG tablet Take 1 tablet by mouth 2 (two) times daily with a meal.     No facility-administered medications prior to visit.    No Known Allergies  ROS Review of Systems  Constitutional: Negative.   HENT: Positive for tinnitus.        Pt does not need hearing aids-hearing  loss per ENT  Eyes:       Eye glasses-cataracts bilat  Respiratory: Negative.   Cardiovascular: Negative.   Gastrointestinal:       GERD  Endocrine:       Appt 2/19  Genitourinary:       Nephro  Musculoskeletal: Negative.   Allergic/Immunologic: Negative.   Neurological: Negative.   Hematological:       Chronic anemia  Psychiatric/Behavioral: Negative.       Objective:    Physical Exam  Constitutional: He is oriented to person, place, and time. He appears well-developed and well-nourished.  HENT:  Head: Normocephalic and atraumatic.  Eyes: Conjunctivae are normal.  Cardiovascular: Normal rate, regular rhythm, normal heart sounds and intact distal pulses.  Pulmonary/Chest: Effort normal and breath sounds normal.  Musculoskeletal:     Cervical back: Normal range of motion and neck supple.  Neurological: He is oriented to person, place, and time.  Psychiatric: He has a normal mood and affect. His behavior is normal.    BP 120/60 (BP Location: Left Arm, Patient Position: Sitting, Cuff Size: Normal)   Pulse (!) 55   Temp 98.5 F (36.9 C) (Temporal)   Ht 6' (1.829 m)   Wt 181 lb 3.2 oz (82.2 kg)   SpO2 99%   BMI 24.58 kg/m  Wt Readings from Last 3 Encounters:  05/18/19 181 lb 3.2 oz (82.2 kg)  05/10/19 184 lb (83.5 kg)  02/04/19 187 lb 11.2 oz (85.1 kg)     Health Maintenance Due  Topic Date Due  . OPHTHALMOLOGY EXAM  07/18/1946  . TETANUS/TDAP  07/18/1955  . FOOT EXAM  05/05/2013  . HEMOGLOBIN A1C  06/21/2018    Lab Results  Component Value Date   TSH 1.48 05/05/2017   Lab Results  Component Value Date   WBC 6.4 01/29/2019   HGB 12.1 (L) 01/29/2019   HCT 35.4 (L) 01/29/2019   MCV 80.6 01/29/2019   PLT 157 01/29/2019   Lab Results  Component Value Date   NA 136 01/29/2019   K 4.3 01/29/2019   CO2 26 01/29/2019   GLUCOSE 183 (H) 01/29/2019   BUN 23 01/29/2019   CREATININE 1.98 (H) 01/29/2019   BILITOT 0.9 01/29/2019   ALKPHOS 49 01/29/2019    AST 17 01/29/2019   ALT 16 01/29/2019   PROT 7.3 01/29/2019   ALBUMIN 4.1 01/29/2019   CALCIUM 8.8 (L) 01/29/2019   ANIONGAP 8 01/29/2019   Lab Results  Component Value Date   CHOL 191 05/05/2017   Lab Results  Component Value Date   HDL 53 05/05/2017   Lab Results  Component Value Date   LDLCALC 117 05/05/2017  Lab Results  Component Value Date   TRIG 106 05/05/2017   No results found for: [facility] Lab Results  Component Value Date   HGBA1C 6.7 12/19/2017      Assessment & Plan:  1. Type 2 diabetes mellitus with diabetic nephropathy, without long-term current use of insulin ([place]) Pt seeing nephrology 1/22-pt has completed labwork for appointment-not available for review-decision to change from Janumet due to renal function-endo 2/19 scheduled to discuss new medication-d/w pt possible injection to replace Glucose readings 129-150 over last week Eye specialist -[place] q 6 months, foot exam completed-p[place] stone suggested 2. Gastro-esophageal reflux disease without esophagitis pepcid and diet changes have improved reflux 3. Hypertensive chronic kidney disease with stage 1 through stage 4 chronic kidney disease, or unspecified chronic kidney disease Pt now taking norvasc at night and lisinopril during the day, nephro following 4. Glaucoma, unspecified glaucoma type, unspecified laterality [place] Follow-up: endo 2/19, 51month   Prestin Munch LHannah Beat MD

## 2019-05-25 ENCOUNTER — Encounter: Payer: Self-pay | Admitting: Family Medicine

## 2019-05-25 ENCOUNTER — Ambulatory Visit (INDEPENDENT_AMBULATORY_CARE_PROVIDER_SITE_OTHER): Payer: Medicare Other | Admitting: Family Medicine

## 2019-05-25 ENCOUNTER — Encounter: Payer: Self-pay | Admitting: "Endocrinology

## 2019-05-25 ENCOUNTER — Ambulatory Visit (INDEPENDENT_AMBULATORY_CARE_PROVIDER_SITE_OTHER): Payer: Medicare Other | Admitting: "Endocrinology

## 2019-05-25 ENCOUNTER — Other Ambulatory Visit: Payer: Self-pay

## 2019-05-25 VITALS — Temp 98.1°F | Ht 72.0 in | Wt 185.2 lb

## 2019-05-25 VITALS — BP 178/77 | HR 67 | Ht 72.0 in | Wt 183.2 lb

## 2019-05-25 DIAGNOSIS — I10 Essential (primary) hypertension: Secondary | ICD-10-CM | POA: Diagnosis not present

## 2019-05-25 DIAGNOSIS — E1159 Type 2 diabetes mellitus with other circulatory complications: Secondary | ICD-10-CM

## 2019-05-25 DIAGNOSIS — E1121 Type 2 diabetes mellitus with diabetic nephropathy: Secondary | ICD-10-CM | POA: Diagnosis not present

## 2019-05-25 DIAGNOSIS — K219 Gastro-esophageal reflux disease without esophagitis: Secondary | ICD-10-CM | POA: Diagnosis not present

## 2019-05-25 DIAGNOSIS — I152 Hypertension secondary to endocrine disorders: Secondary | ICD-10-CM

## 2019-05-25 LAB — POCT GLYCOSYLATED HEMOGLOBIN (HGB A1C): Hemoglobin A1C: 6.9 % — AB (ref 4.0–5.6)

## 2019-05-25 MED ORDER — SITAGLIPTIN PHOSPHATE 50 MG PO TABS: 50.0000 mg | ORAL_TABLET | Freq: Every day | ORAL | 3 refills | Status: DC

## 2019-05-25 MED ORDER — LISINOPRIL 20 MG PO TABS: 20.0000 mg | ORAL_TABLET | Freq: Every day | ORAL | 3 refills | Status: DC

## 2019-05-25 MED ORDER — ROSUVASTATIN CALCIUM 5 MG PO TABS: 5.0000 mg | ORAL_TABLET | Freq: Every day | ORAL | 3 refills | Status: DC

## 2019-05-25 NOTE — Progress Notes (Signed)
Endocrinology Consult Note       05/25/2019, 7:50 PM   Subjective:    Patient ID: Richard Swanson, male    DOB: [birth year].  Richard Swanson is being seen in consultation for management of currently uncontrolled symptomatic diabetes requested by  Maryruth Hancock, MD.   Past Medical History:  Diagnosis Date  . Cancer [facility])    Prostate  . Chronic obstructive pulmonary disease, unspecified ([place])   . Diabetes mellitus without complication ([place])   . Dizziness   . Gastro-esophageal reflux disease without esophagitis   . Glaucoma   . Glomerular disorders in diseases classified elsewhere   . Gross hematuria   . Hypertension   . Hypertensive chronic kidney disease with stage 1 through stage 4 chronic kidney disease, or unspecified chronic kidney disease   . Kidney disease, chronic, stage III (moderate, EGFR 30-59 ml/min)   . Malignant neoplasm of prostate ([place])   . Prostate cancer ([place]) 11/03/2010   dx 2006  . Type 2 diabetes mellitus with diabetic nephropathy ([place])   . Unspecified kidney failure     Past Surgical History:  Procedure Laterality Date  . PROSTATE SURGERY      Social History   Socioeconomic History  . Marital status: Married    Spouse name: Not on file  . Number of children: Not on file  . Years of education: Not on file  . Highest education level: Not on file  Occupational History  . Not on file  Tobacco Use  . Smoking status: Never Smoker  . Smokeless tobacco: Never Used  Substance and Sexual Activity  . Alcohol use: No  . Drug use: No  . Sexual activity: Not on file  Other Topics Concern  . Not on file  Social History Narrative  . Not on file   Social Determinants of Health   Financial Resource Strain:   . Difficulty of Paying Living Expenses: Not on file  Food Insecurity:   . Worried About Charity fundraiser in the Last Year: Not on file  . Ran Out of Food in the  Last Year: Not on file  Transportation Needs:   . Lack of Transportation (Medical): Not on file  . Lack of Transportation (Non-Medical): Not on file  Physical Activity:   . Days of Exercise per Week: Not on file  . Minutes of Exercise per Session: Not on file  Stress:   . Feeling of Stress : Not on file  Social Connections:   . Frequency of Communication with Friends and Family: Not on file  . Frequency of Social Gatherings with Friends and Family: Not on file  . Attends Religious Services: Not on file  . Active Member of Clubs or Organizations: Not on file  . Attends Archivist Meetings: Not on file  . Marital Status: Not on file    Family History  Problem Relation Age of Onset  . Stroke Father     Outpatient Encounter Medications as of 05/25/2019  Medication Sig  . amLODipine (NORVASC) 10 MG tablet Take 10 mg by mouth daily.  . COMBIGAN 0.2-0.5 % ophthalmic solution 1 drop  every 12 (twelve) hours.   . famotidine (PEPCID) 20 MG tablet Take 1 tablet (20 mg total) by mouth 2 (two) times daily.  Marland Kitchen lisinopril (ZESTRIL) 20 MG tablet Take 1 tablet (20 mg total) by mouth daily.  . rosuvastatin (CRESTOR) 5 MG tablet Take 1 tablet (5 mg total) by mouth daily.  . sitaGLIPtin (JANUVIA) 50 MG tablet Take 1 tablet (50 mg total) by mouth daily.  . [DISCONTINUED] lisinopril (ZESTRIL) 10 MG tablet Take 10 mg by mouth daily.  . [DISCONTINUED] sitaGLIPtin-metformin (JANUMET) 50-500 MG tablet Take 1 tablet by mouth 2 (two) times daily with a meal.   No facility-administered encounter medications on file as of 05/25/2019.    ALLERGIES: No Known Allergies  VACCINATION STATUS: Immunization History  Administered Date(s) Administered  . Influenza, High Dose Seasonal PF 03/23/2018  . Influenza,inj,Quad PF,6+ Mos 01/30/2016, 01/29/2017, 01/28/2019  . Pneumococcal Polysaccharide-23 01/28/2019    Diabetes He presents for his initial diabetic visit. He has type 2 diabetes mellitus. Onset  time: He was diagnosed at approximate age of 60 years. His disease course has been worsening. Pertinent negatives for hypoglycemia include no confusion, headaches, pallor or seizures. There are no diabetic associated symptoms. Pertinent negatives for diabetes include no chest pain, no fatigue, no polydipsia, no polyphagia, no polyuria and no weakness. There are no hypoglycemic complications. Symptoms are stable. Diabetic complications include nephropathy. Risk factors for coronary artery disease include dyslipidemia and diabetes mellitus. When asked about current treatments, none (He was treated with Janumet for several years until he was stopped recently due to CKD.  He has not taken any medications for diabetes in the last several weeks.) were reported. His weight is fluctuating minimally. He is following a generally unhealthy diet. When asked about meal planning, he reported none. He has not had a previous visit with a dietitian. He rarely participates in exercise. (He did not bring any logs nor meter with him to review today.  His point-of-care A1c was 6.9%.) An ACE inhibitor/angiotensin II receptor blocker is being taken. Eye exam is current.  Hyperlipidemia This is a chronic problem. The current episode started more than 1 year ago. The problem is uncontrolled. Exacerbating diseases include chronic renal disease and diabetes. Pertinent negatives include no chest pain, myalgias or shortness of breath. He is currently on no antihyperlipidemic treatment. Risk factors for coronary artery disease include dyslipidemia, diabetes mellitus, hypertension, male sex and a sedentary lifestyle.  Hypertension This is a chronic problem. The current episode started more than 1 year ago. The problem is uncontrolled. Pertinent negatives include no chest pain, headaches, neck pain, palpitations or shortness of breath. Risk factors for coronary artery disease include diabetes mellitus, dyslipidemia, male gender and sedentary  lifestyle. Past treatments include ACE inhibitors. Hypertensive end-organ damage includes kidney disease. Identifiable causes of hypertension include chronic renal disease.     Review of Systems  Constitutional: Negative for chills, fatigue, fever and unexpected weight change.  HENT: Negative for dental problem, mouth sores and trouble swallowing.   Eyes: Negative for visual disturbance.  Respiratory: Negative for cough, choking, chest tightness, shortness of breath and wheezing.   Cardiovascular: Negative for chest pain, palpitations and leg swelling.  Gastrointestinal: Negative for abdominal distention, abdominal pain, constipation, diarrhea, nausea and vomiting.  Endocrine: Negative for polydipsia, polyphagia and polyuria.  Genitourinary: Negative for dysuria, flank pain, hematuria and urgency.  Musculoskeletal: Negative for back pain, gait problem, myalgias and neck pain.  Skin: Negative for pallor, rash and wound.  Neurological: Negative for  seizures, syncope, weakness, numbness and headaches.  Psychiatric/Behavioral: Negative for confusion and dysphoric mood.    Objective:    Vitals with BMI 05/25/2019 05/25/2019 05/18/2019  Height 6' 0"  6' 0"  6' 0"   Weight 185 lbs 3 oz 183 lbs 3 oz 181 lbs 3 oz  BMI 25.11 44.31 54.00  Systolic - 867 619  Diastolic - 77 60  Pulse - 67 55    BP (!) 178/77   Pulse 67   Ht 6' (1.829 m)   Wt 183 lb 3.2 oz (83.1 kg)   BMI 24.85 kg/m   Wt Readings from Last 3 Encounters:  05/25/19 185 lb 3.2 oz (84 kg)  05/25/19 183 lb 3.2 oz (83.1 kg)  05/18/19 181 lb 3.2 oz (82.2 kg)     Physical Exam Constitutional:      General: He is not in acute distress.    Appearance: He is well-developed.  HENT:     Head: Normocephalic and atraumatic.  Neck:     Thyroid: No thyromegaly.     Trachea: No tracheal deviation.  Cardiovascular:     Rate and Rhythm: Normal rate.     Pulses:          Dorsalis pedis pulses are 1+ on the right side and 1+ on the left  side.       Posterior tibial pulses are 1+ on the right side and 1+ on the left side.     Heart sounds: S1 normal and S2 normal. No murmur. No gallop.   Pulmonary:     Effort: Pulmonary effort is normal. No respiratory distress.     Breath sounds: No wheezing.  Abdominal:     General: Bowel sounds are normal. There is no distension.     Palpations: Abdomen is soft.     Tenderness: There is no abdominal tenderness. There is no guarding.  Musculoskeletal:     Right shoulder: No swelling or deformity.     Cervical back: Normal range of motion and neck supple.  Skin:    General: Skin is warm and dry.     Findings: No rash.     Nails: There is no clubbing.  Neurological:     Mental Status: He is alert and oriented to person, place, and time.     Cranial Nerves: No cranial nerve deficit.     Sensory: No sensory deficit.     Gait: Gait normal.     Deep Tendon Reflexes: Reflexes are normal and symmetric.  Psychiatric:        Speech: Speech normal.        Behavior: Behavior normal. Behavior is cooperative.        Thought Content: Thought content normal.        Judgment: Judgment normal.       CMP ( most recent) CMP     Component Value Date/Time   NA 136 01/29/2019 1029   K 4.3 01/29/2019 1029   CL 102 01/29/2019 1029   CO2 26 01/29/2019 1029   GLUCOSE 183 (H) 01/29/2019 1029   BUN 23 01/29/2019 1029   BUN 18 05/05/2017 0000   CREATININE 1.98 (H) 01/29/2019 1029   CALCIUM 8.8 (L) 01/29/2019 1029   PROT 7.3 01/29/2019 1029   ALBUMIN 4.1 01/29/2019 1029   AST 17 01/29/2019 1029   ALT 16 01/29/2019 1029   ALKPHOS 49 01/29/2019 1029   BILITOT 0.9 01/29/2019 1029   GFRNONAA 31 (L) 01/29/2019 1029   GFRAA 35 (L) 01/29/2019 1029  Diabetic Labs (most recent): Lab Results  Component Value Date   HGBA1C 6.9 (A) 05/25/2019   HGBA1C 6.7 12/19/2017     Lipid Panel ( most recent) Lipid Panel     Component Value Date/Time   CHOL 191 05/05/2017 0000   TRIG 106 05/05/2017  0000   HDL 53 05/05/2017 0000   LDLCALC 117 05/05/2017 0000      Lab Results  Component Value Date   TSH 1.48 05/05/2017       Assessment & Plan:   1. Type 2 diabetes mellitus with diabetic nephropathy, without long-term current use of insulin ([place])  - Derrick Tiegs has currently uncontrolled symptomatic type 2 DM since 85 years of age,  with most recent A1c of 6.9 %. Recent labs reviewed. - I had a long discussion with him about the progressive nature of diabetes and the pathology behind its complications. -his diabetes is complicated by CKD and he remains at a high risk for more acute and chronic complications which include CAD, CVA, CKD, retinopathy, and neuropathy. These are all discussed in detail with him.  - I have counseled him on diet  and weight management  by adopting a carbohydrate restricted/protein rich diet. Patient is encouraged to switch to  unprocessed or minimally processed     complex starch and increased protein intake (animal or plant source), fruits, and vegetables. -  he is advised to stick to a routine mealtimes to eat 3 meals  a day and avoid unnecessary snacks ( to snack only to correct hypoglycemia).   - he admits that there is a room for improvement in his food and drink choices. - Suggestion is made for him to avoid simple carbohydrates  from his diet including Cakes, Sweet Desserts, Ice Cream, Soda (diet and regular), Sweet Tea, Candies, Chips, Cookies, Store Bought Juices, Alcohol in Excess of  1-2 drinks a day, Artificial Sweeteners,  Coffee Creamer, and "Sugar-free" Products. This will help patient to have more stable blood glucose profile and potentially avoid unintended weight gain.  - he will be scheduled with Jearld Fenton, RDN, CDE for diabetes education.  - I have approached him with the following individualized plan to manage  his diabetes and patient agrees:   -Based on his point-of-care A1c of 6.9%, he will not need insulin treatment at  this time. -I discussed and reinitiated Januvia 50 mg p.o. daily with breakfast.    - he is encouraged to call clinic for blood glucose levels less than 70 or above 300 mg /dl.  - he is not a candidate for metformin, SGLT2 inhibitors due to concurrent renal insufficiency.   - Specific targets for  A1c;  LDL, HDL,  and Triglycerides were discussed with the patient.  2) Blood Pressure /Hypertension:  his blood pressure is uncontrolled to target.   he is advised to increase lisinopril to 20 mg p.o. daily at breakfast.  He is also on metoprolol 25 mg p.o. daily, as well as amlodipine 10 mg p.o. daily.   3) Lipids/Hyperlipidemia:   Review of his recent lipid panel showed ucontrolled  LDL at 117 .  he is initiated on Crestor 5 mg p.o. nightly.  Side effects and precautions discussed with him.   4)  Weight/Diet:  Body mass index is 24.85 kg/m.  -     he is not a candidate for weight loss. Exercise, and detailed carbohydrates information provided  -  detailed on discharge instructions.  5) Chronic Care/Health Maintenance:  -he  is  on ACEI/ARB and Statin medications and  is encouraged to initiate and continue to follow up with Ophthalmology, Dentist,  Podiatrist at least yearly or according to recommendations, and advised to   stay away from smoking. I have recommended yearly flu vaccine and pneumonia vaccine at least every 5 years; moderate intensity exercise for up to 150 minutes weekly; and  sleep for at least 7 hours a day.  - he is  advised to maintain close follow up with [place], Rex Kras, MD for primary care needs, as well as his other providers for optimal and coordinated care.   - Time spent in this patient care: 60 min, of which > 50% was spent in  counseling  him about his complicated type 2 diabetes, hyperlipidemia, hypertension and the rest reviewing his blood glucose logs , discussing his hypoglycemia and hyperglycemia episodes, reviewing his current and  previous labs / studies  (  including abstraction from other facilities) and medications  doses and developing a  long term treatment plan based on the latest standards of care/ guidelines; and documenting his care.    Please refer to Patient Instructions for Blood Glucose Monitoring and Insulin/Medications Dosing Guide"  in media tab for additional information. Please  also refer to " Patient Self Inventory" in the Media  tab for reviewed elements of pertinent patient history.  Donne Hazel participated in the discussions, expressed understanding, and voiced agreement with the above plans.  All questions were answered to his satisfaction. he is encouraged to contact clinic should he have any questions or concerns prior to his return visit.   Follow up plan: - Return in about 3 months (around 08/23/2019), or he has labs ordered by Dr. Holly Bodily, i want him to do those, for Follow up with Pre-visit Labs.  Glade Lloyd, MD [facility] [address] [place], [place] [address] Phone: [phone]  Fax: [phone]    05/25/2019, 7:50 PM  This note was partially dictated with voice recognition software. Similar sounding words can be transcribed inadequately or may not  be corrected upon review.

## 2019-05-25 NOTE — Patient Instructions (Addendum)
Take amlodipine 10mg  AT NIGHT  Take lisinopril 20mg  in the MORNING  Take blood pressure first thing in the morning and write it down and in the evening and write it down  Take glucose readings first thing in the morning and write it down  Keep cardiology appointment-important to keep that appointment-we have noticed ECG changes that could indicate a heart problem

## 2019-05-25 NOTE — Progress Notes (Signed)
Established Patient Office Visit  Subjective:  Patient ID: Richard Swanson, male    DOB: 18-Dec-1934  Age: 85 y.o. MRN: [id number]  CC:  Chief Complaint  Patient presents with  . Follow-up  . Diabetes    HPI Richard Swanson presents for HTN DM-Dr. Dorris Fetch changed from metformin to Januvia due to concerns about renal function Hyperlipidemia-start Crestor -recommended by endo today HTN-increased lisinopril to 43m daily-now taking amlodipine 145m ? If pt taking metoprolol 2538mt currently with bp readings 170's/60's GERD-pepcid Glaucoma -using drops as directed by eye doctor Past Medical History:  Diagnosis Date  . [facility]  Prostate  . Chronic obstructive pulmonary disease, unspecified ([place] . Diabetes mellitus without complication ([facility] . Dizziness   . Gastro-esophageal reflux disease without esophagitis   . Glaucoma   . Glomerular disorders in diseases classified elsewhere   . Gross hematuria   . Hypertension   . Hypertensive chronic kidney disease with stage 1 through stage 4 chronic kidney disease, or unspecified chronic kidney disease   . Kidney disease, chronic, stage III (moderate, EGFR 30-59 ml/min)   . Malignant [place] of prostate ([place] . [place]   dx 2006  . Type 2 diabetes mellitus with diabetic nephropathy ([place] . Unspecified kidney failure     Past Surgical History:  Procedure Laterality Date  . PROSTATE SURGERY      Family History  Problem Relation Age of Onset  . Stroke Father     Social History   Socioeconomic History  . Marital status: Married    Spouse name: Not on file  . Number of children: Not on file  . Years of education: Not on file  . Highest education level: Not on file  Occupational History  . Not on file  Tobacco Use  . Smoking status: Never Smoker  . Smokeless tobacco: Never Used  Substance and Sexual Activity  . Alcohol use: No  . Drug use: No  . Sexual activity: Not on file  Other  Topics Concern  . Not on file  Social History Narrative  . Not on file   Social Determinants of Health   Financial Resource Strain:   . Difficulty of Paying Living Expenses: Not on file  Food Insecurity:   . Worried About RunCharity fundraiser the Last Year: Not on file  . Ran Out of Food in the Last Year: Not on file  Transportation Needs:   . Lack of Transportation (Medical): Not on file  . Lack of Transportation (Non-Medical): Not on file  Physical Activity:   . Days of Exercise per Week: Not on file  . Minutes of Exercise per Session: Not on file  Stress:   . Feeling of Stress : Not on file  Social Connections:   . Frequency of Communication with Friends and Family: Not on file  . Frequency of Social Gatherings with Friends and Family: Not on file  . Attends Religious Services: Not on file  . Active Member of Clubs or Organizations: Not on file  . Attends CluArchivistetings: Not on file  . Marital Status: Not on file  Intimate Partner Violence:   . Fear of Current or Ex-Partner: Not on file  . Emotionally Abused: Not on file  . Physically Abused: Not on file  . Sexually Abused: Not on file    Outpatient Medications Prior to Visit  Medication Sig Dispense Refill  . amLODipine (NORVASC)  10 MG tablet Take 10 mg by mouth daily.    . COMBIGAN 0.2-0.5 % ophthalmic solution 1 drop every 12 (twelve) hours.     . famotidine (PEPCID) 20 MG tablet Take 1 tablet (20 mg total) by mouth 2 (two) times daily. 60 tablet 1  . lisinopril (ZESTRIL) 20 MG tablet Take 1 tablet (20 mg total) by mouth daily. 30 tablet 3  . rosuvastatin (CRESTOR) 5 MG tablet Take 1 tablet (5 mg total) by mouth daily. 30 tablet 3  . sitaGLIPtin (JANUVIA) 50 MG tablet Take 1 tablet (50 mg total) by mouth daily. 30 tablet 3   No facility-administered medications prior to visit.    No Known Allergies  ROS Review of Systems  Respiratory: Negative.   Cardiovascular: Negative.   Gastrointestinal:  Negative.   Allergic/Immunologic: Negative.   Neurological: Negative.   Hematological: Negative.   Psychiatric/Behavioral: Negative.       Objective:    Physical Exam  Constitutional: He is oriented to person, place, and time. He appears well-developed and well-nourished.  HENT:  Head: Normocephalic and atraumatic.  Eyes: Conjunctivae are normal.  Cardiovascular: Normal rate.  Pulmonary/Chest: Effort normal and breath sounds normal.  Musculoskeletal:        General: Normal range of motion.  Neurological: He is alert and oriented to person, place, and time.  Psychiatric: He has a normal mood and affect.    Temp 98.1 F (36.7 C) (Temporal)   Ht 6' (1.829 m)   Wt 185 lb 3.2 oz (84 kg)   SpO2 99%   BMI 25.12 kg/m  Wt Readings from Last 3 Encounters:  05/25/19 185 lb 3.2 oz (84 kg)  05/25/19 183 lb 3.2 oz (83.1 kg)  05/18/19 181 lb 3.2 oz (82.2 kg)     Health Maintenance Due  Topic Date Due  . OPHTHALMOLOGY EXAM  07/18/1946  . TETANUS/TDAP  07/18/1955    Lab Results  Component Value Date   TSH 1.48 05/05/2017   Lab Results  Component Value Date   WBC 6.4 01/29/2019   HGB 12.1 (L) 01/29/2019   HCT 35.4 (L) 01/29/2019   MCV 80.6 01/29/2019   PLT 157 01/29/2019   Lab Results  Component Value Date   NA 136 01/29/2019   K 4.3 01/29/2019   CO2 26 01/29/2019   GLUCOSE 183 (H) 01/29/2019   BUN 23 01/29/2019   CREATININE 1.98 (H) 01/29/2019   BILITOT 0.9 01/29/2019   ALKPHOS 49 01/29/2019   AST 17 01/29/2019   ALT 16 01/29/2019   PROT 7.3 01/29/2019   ALBUMIN 4.1 01/29/2019   CALCIUM 8.8 (L) 01/29/2019   ANIONGAP 8 01/29/2019   Lab Results  Component Value Date   CHOL 191 05/05/2017   Lab Results  Component Value Date   HDL 53 05/05/2017   Lab Results  Component Value Date   LDLCALC 117 05/05/2017   Lab Results  Component Value Date   TRIG 106 05/05/2017   No results found for: [facility] Lab Results  Component Value Date   HGBA1C 6.9 (A)  05/25/2019      Assessment & Plan:  1. 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 Gastro-esophageal reflux disease without esophagitis pepcid-stable Follow-up:  Cardiology to evaluate abnormal ecg   Nelva Hauk Hannah Beat, MD

## 2019-05-25 NOTE — Patient Instructions (Signed)
                                     Advice for Weight Management  -For most of us the best way to lose weight is by diet management. Generally speaking, diet management means consuming less calories intentionally which over time brings about progressive weight loss.  This can be achieved more effectively by restricting carbohydrate consumption to the minimum possible.  So, it is critically important to know your numbers: how much calorie you are consuming and how much calorie you need. More importantly, our carbohydrates sources should be unprocessed or minimally processed complex starch food items.   Sometimes, it is important to balance nutrition by increasing protein intake (animal or plant source), fruits, and vegetables.  -Sticking to a routine mealtime to eat 3 meals a day and avoiding unnecessary snacks is shown to have a big role in weight control. Under normal circumstances, the only time we lose real weight is when we are hungry, so allow hunger to take place- hunger means no food between meal times, only water.  It is not advisable to starve.   -It is better to avoid simple carbohydrates including: Cakes, Sweet Desserts, Ice Cream, Soda (diet and regular), Sweet Tea, Candies, Chips, Cookies, Store Bought Juices, Alcohol in Excess of  1-2 drinks a day, Artificial Sweeteners, Doughnuts, Coffee Creamers, "Sugar-free" Products, etc, etc.  This is not a complete list.....    -Consulting with certified diabetes educators is proven to provide you with the most accurate and current information on diet.  Also, you may be  interested in discussing diet options/exchanges , we can schedule a visit with Penny Crumpton, RDN, CDE for individualized nutrition education.  -Exercise: If you are able: 30 -60 minutes a day ,4 days a week, or 150 minutes a week.  The longer the better.  Combine stretch, strength, and aerobic activities.  If you were told in the past that you  have high risk for cardiovascular diseases, you may seek evaluation by your heart doctor prior to initiating moderate to intense exercise programs.                                  Additional Care Considerations for Diabetes   -Diabetes  is a chronic disease.  The most important care consideration is regular follow-up with your diabetes care provider with the goal being avoiding or delaying its complications and to take advantage of advances in medications and technology.    -Type 2 diabetes is known to coexist with other important comorbidities such as high blood pressure and high cholesterol.  It is critical to control not only the diabetes but also the high blood pressure and high cholesterol to minimize and delay the risk of complications including coronary artery disease, stroke, amputations, blindness, etc.    - Studies showed that people with diabetes will benefit from a class of medications known as ACE inhibitors and statins.  Unless there are specific reasons not to be on these medications, the standard of care is to consider getting one from these groups of medications at an optimal doses.  These medications are generally considered safe and proven to help protect the heart and the kidneys.    - People with diabetes are encouraged to initiate and maintain regular follow-up with eye doctors, foot doctors, dentists ,   and if necessary heart and kidney doctors.     - It is highly recommended that people with diabetes quit smoking or stay away from smoking, and get yearly  flu vaccine and pneumonia vaccine at least every 5 years.  One other important lifestyle recommendation is to ensure adequate sleep - at least 6-7 hours of uninterrupted sleep at night.  -Exercise: If you are able: 30 -60 minutes a day, 4 days a week, or 150 minutes a week.  The longer the better.  Combine stretch, strength, and aerobic activities.  If you were told in the past that you have high risk for cardiovascular  diseases, you may seek evaluation by your heart doctor prior to initiating moderate to intense exercise programs.     COVID-19 Vaccine Information can be found at: [email or web address] For questions related to vaccine distribution or appointments, please email [email or web address] or call [phone].        

## 2019-05-27 ENCOUNTER — Ambulatory Visit: Payer: Medicare Other | Admitting: Nutrition

## 2019-05-31 ENCOUNTER — Encounter: Payer: Self-pay | Admitting: Nutrition

## 2019-05-31 ENCOUNTER — Encounter: Payer: Medicare Other | Attending: "Endocrinology | Admitting: Nutrition

## 2019-05-31 ENCOUNTER — Other Ambulatory Visit: Payer: Self-pay

## 2019-05-31 VITALS — Ht 72.0 in | Wt 186.6 lb

## 2019-05-31 DIAGNOSIS — E1165 Type 2 diabetes mellitus with hyperglycemia: Secondary | ICD-10-CM | POA: Diagnosis present

## 2019-05-31 DIAGNOSIS — N183 Chronic kidney disease, stage 3 unspecified: Secondary | ICD-10-CM | POA: Insufficient documentation

## 2019-05-31 DIAGNOSIS — I10 Essential (primary) hypertension: Secondary | ICD-10-CM | POA: Insufficient documentation

## 2019-05-31 DIAGNOSIS — E118 Type 2 diabetes mellitus with unspecified complications: Secondary | ICD-10-CM | POA: Diagnosis present

## 2019-05-31 DIAGNOSIS — E1159 Type 2 diabetes mellitus with other circulatory complications: Secondary | ICD-10-CM | POA: Diagnosis not present

## 2019-05-31 DIAGNOSIS — IMO0002 Reserved for concepts with insufficient information to code with codable children: Secondary | ICD-10-CM

## 2019-05-31 DIAGNOSIS — I152 Hypertension secondary to endocrine disorders: Secondary | ICD-10-CM

## 2019-05-31 NOTE — Progress Notes (Signed)
Medical Nutrition Therapy:  Appt start time: 1000 end time:  1100.   Assessment:  Primary concerns today: Diabetes Typd 2 x -10 yrs.  Sees Dr. Dorris Fetch, Endocrinology. Here with his wife. Had been going to [place]... Back now 3 days per week.  Wt stable.  Januvia for his DM.  Was taken off Metformin due to kidney issues. Back on Cholesterol pill  a week ago.   GOing to see Dr. Edson Snowball, cardiology tomorrow. FBS: 112-130's. A1C 6.9% up from 6.5%.   Has CKD 3-4 Creatine 1.98 mg/dl, eGFR 36. Likes to snack. Eats 2 meals a day. Likes ice cream at night and nabs etc for snacks for a meal at lunch. Diet has been slightly higher in processed foods. He is willing to work on diet and reduce high sodium foods to help improve his DM and CKD.  CMP Latest Ref Rng & Units 01/29/2019 07/29/2018 07/23/2017  Glucose 70 - 99 mg/dL 183(H) 142(H) 132(H)  BUN 8 - 23 mg/dL 23 26(H) 23(H)  Creatinine 0.61 - 1.24 mg/dL 1.98(H) 1.96(H) 1.77(H)  Sodium 135 - 145 mmol/L 136 139 142  Potassium 3.5 - 5.1 mmol/L 4.3 4.4 4.3  Chloride 98 - 111 mmol/L 102 104 105  CO2 22 - 32 mmol/L 26 26 25   Calcium 8.9 - 10.3 mg/dL 8.8(L) 9.3 9.2  Total Protein 6.5 - 8.1 g/dL 7.3 7.2 7.4  Total Bilirubin 0.3 - 1.2 mg/dL 0.9 0.7 0.8  Alkaline Phos 38 - 126 U/L 49 48 49  AST 15 - 41 U/L 17 16 22   ALT 0 - 44 U/L 16 15 18    Lipid Panel     Component Value Date/Time   CHOL 191 05/05/2017 0000   TRIG 106 05/05/2017 0000   HDL 53 05/05/2017 0000   LDLCALC 117 05/05/2017 0000   Lab Results  Component Value Date   HGBA1C 6.9 (A) 05/25/2019    Preferred Learning Style:     No preference indicated   Learning Readiness:  Ready  Change in progress   MEDICATIONS:   DIETARY INTAKE:  24-hr recall:  B ( 930-10 AM): Eggs, bacon 1 slices, Toast 1 slice white wheat, jelly, milk use to drink juice,  Or raisin bran Snk ( AM): nabs  L ( PM): Corn pudding, beef, string beans, sweet tea, 1 slice bread Snk ( PM): misc fruit  or crackers. D ( PM): RB sandwich,  Sweet tea Snk ( PM):  Beverages: sweet tea. water  Usual physical activity: [employer] 3 times per week.  Estimated energy needs: 2200 calories 248 g carbohydrates 165 g protein 61 g fat  Progress Towards Goal(s):  In progress.   Nutritional Diagnosis:  NB-1.1 Food and nutrition-related knowledge deficit As related to DM and CKD.  As evidenced by A1C 6.9% and eGFR 36..    Intervention:  Nutrition and Diabetes education provided on My Plate, CHO counting, meal planning, portion sizes, timing of meals, avoiding snacks between meals unless having a low blood sugar, target ranges for A1C and blood sugars, signs/symptoms and treatment of hyper/hypoglycemia, monitoring blood sugars, taking medications as prescribed, benefits of exercising 30 minutes per day and prevention of complications of DM. Low salt low cholesterol diet   .Goals  Follow My Plate Eat meals on time Drink more water Cut out sweet tea Don't eat past 7 pm Keep walking [employer] Follow Low Sodium Handout   Teaching Method Utilized:  Visual Auditory Hands on  Handouts given during visit include:  The  Plate Method   Meal Plan Card    Barriers to learning/adherence to lifestyle change: none  Demonstrated degree of understanding via:  Teach Back   Monitoring/Evaluation:  Dietary intake, exercise, , and body weight in 1 month(s).

## 2019-05-31 NOTE — Patient Instructions (Signed)
Goals  Follow My Plate Eat meals on time Drink more water Cut out sweet tea Don't eat past 7 pm Keep walking [employer] Follow Low Sodium Handout

## 2019-06-01 ENCOUNTER — Encounter: Payer: Self-pay | Admitting: Cardiovascular Disease

## 2019-06-01 ENCOUNTER — Ambulatory Visit (INDEPENDENT_AMBULATORY_CARE_PROVIDER_SITE_OTHER): Payer: Medicare Other | Admitting: Cardiovascular Disease

## 2019-06-01 VITALS — BP 143/79 | HR 67 | Temp 96.0°F | Ht 72.0 in | Wt 183.0 lb

## 2019-06-01 DIAGNOSIS — I10 Essential (primary) hypertension: Secondary | ICD-10-CM

## 2019-06-01 DIAGNOSIS — E1159 Type 2 diabetes mellitus with other circulatory complications: Secondary | ICD-10-CM | POA: Diagnosis not present

## 2019-06-01 DIAGNOSIS — I440 Atrioventricular block, first degree: Secondary | ICD-10-CM

## 2019-06-01 DIAGNOSIS — I152 Hypertension secondary to endocrine disorders: Secondary | ICD-10-CM

## 2019-06-01 DIAGNOSIS — R9431 Abnormal electrocardiogram [ECG] [EKG]: Secondary | ICD-10-CM

## 2019-06-01 NOTE — Patient Instructions (Signed)
Medication Instructions:  Your physician recommends that you continue on your current medications as directed. Please refer to the Current Medication list given to you today.   Labwork: NONE  Testing/Procedures: NONE  Follow-Up: Your physician recommends that you schedule a follow-up appointment in: AS NEEDED      Any Other Special Instructions Will Be Listed Below (If Applicable).     If you need a refill on your cardiac medications before your next appointment, please call your pharmacy.   

## 2019-06-01 NOTE — Progress Notes (Signed)
CARDIOLOGY CONSULT NOTE  Patient ID: Richard Swanson MRN: [id number] DOB/AGE: [birth year] 85 y.o.  Admit date: (Not on file) Primary Physician: Maryruth Hancock, MD  Reason for Consultation: Abnormal ECG  HPI: Richard Swanson is a 85 y.o. male who is being seen today for the evaluation of abnormal ECG at the request of Corum, Rex Kras, MD.   Past medical history includes type 2 diabetes mellitus.  I reviewed PCP notes dated 05/25/2019.  Lisinopril was increased to 20 mg daily and he is also on amlodipine 10 mg daily she questioned whether or not the patient was taking Lopressor as blood pressure readings were in the 170/60 range.  I personally reviewed the ECG performed on 05/25/2019 which demonstrated sinus rhythm with first-degree AV block, PR interval 244 ms.  There was evidence for LVH with consequent repolarization abnormalities and QRS widening.  I compared this to an ECG performed on 05/12/2013 which I also personally reviewed which demonstrated sinus rhythm with LVH, Axis was -27 degrees compared to the QRS axis on 05/25/2019 which is -42 degrees.  PR interval was 194 ms.  The patient denies any symptoms of chest pain, palpitations, shortness of breath, lightheadedness, dizziness, leg swelling, orthopnea, PND, and syncope.  He said "I am an 85 year old man and I am blessed just to be here ".   No Known Allergies  Current Outpatient Medications  Medication Sig Dispense Refill  . amLODipine (NORVASC) 10 MG tablet Take 10 mg by mouth daily.    . COMBIGAN 0.2-0.5 % ophthalmic solution 1 drop every 12 (twelve) hours.     . famotidine (PEPCID) 20 MG tablet Take 1 tablet (20 mg total) by mouth 2 (two) times daily. 60 tablet 1  . lisinopril (ZESTRIL) 20 MG tablet Take 1 tablet (20 mg total) by mouth daily. 30 tablet 3  . metoprolol tartrate (LOPRESSOR) 25 MG tablet Take 25 mg by mouth daily.    . rosuvastatin (CRESTOR) 5 MG tablet Take 1 tablet (5 mg total) by mouth daily. 30  tablet 3  . sitaGLIPtin (JANUVIA) 50 MG tablet Take 1 tablet (50 mg total) by mouth daily. 30 tablet 3   No current facility-administered medications for this visit.    Past Medical History:  Diagnosis Date  . Cancer [facility])    Prostate  . Chronic obstructive pulmonary disease, unspecified ([place])   . Diabetes mellitus without complication ([place])   . Dizziness   . Gastro-esophageal reflux disease without esophagitis   . Glaucoma   . Glomerular disorders in diseases classified elsewhere   . Gross hematuria   . Hypertension   . Hypertensive chronic kidney disease with stage 1 through stage 4 chronic kidney disease, or unspecified chronic kidney disease   . Kidney disease, chronic, stage III (moderate, EGFR 30-59 ml/min)   . Malignant neoplasm of prostate ([place])   . Prostate cancer ([place]) 11/03/2010   dx 2006  . Type 2 diabetes mellitus with diabetic nephropathy ([place])   . Unspecified kidney failure     Past Surgical History:  Procedure Laterality Date  . PROSTATE SURGERY      Social History   Socioeconomic History  . Marital status: Married    Spouse name: Not on file  . Number of children: Not on file  . Years of education: Not on file  . Highest education level: Not on file  Occupational History  . Not on file  Tobacco Use  . Smoking status: Never Smoker  . Smokeless  tobacco: Never Used  Substance and Sexual Activity  . Alcohol use: No  . Drug use: No  . Sexual activity: Not on file  Other Topics Concern  . Not on file  Social History Narrative  . Not on file   Social Determinants of Health   Financial Resource Strain:   . Difficulty of Paying Living Expenses: Not on file  Food Insecurity:   . Worried About Charity fundraiser in the Last Year: Not on file  . Ran Out of Food in the Last Year: Not on file  Transportation Needs:   . Lack of Transportation (Medical): Not on file  . Lack of Transportation (Non-Medical): Not on file  Physical Activity:   . Days of  Exercise per Week: Not on file  . Minutes of Exercise per Session: Not on file  Stress:   . Feeling of Stress : Not on file  Social Connections:   . Frequency of Communication with Friends and Family: Not on file  . Frequency of Social Gatherings with Friends and Family: Not on file  . Attends Religious Services: Not on file  . Active Member of Clubs or Organizations: Not on file  . Attends Archivist Meetings: Not on file  . Marital Status: Not on file  Intimate Partner Violence:   . Fear of Current or Ex-Partner: Not on file  . Emotionally Abused: Not on file  . Physically Abused: Not on file  . Sexually Abused: Not on file     No family history of premature CAD in 1st degree relatives.  Current Meds  Medication Sig  . amLODipine (NORVASC) 10 MG tablet Take 10 mg by mouth daily.  . COMBIGAN 0.2-0.5 % ophthalmic solution 1 drop every 12 (twelve) hours.   . famotidine (PEPCID) 20 MG tablet Take 1 tablet (20 mg total) by mouth 2 (two) times daily.  Marland Kitchen lisinopril (ZESTRIL) 20 MG tablet Take 1 tablet (20 mg total) by mouth daily.  . metoprolol tartrate (LOPRESSOR) 25 MG tablet Take 25 mg by mouth daily.  . rosuvastatin (CRESTOR) 5 MG tablet Take 1 tablet (5 mg total) by mouth daily.  . sitaGLIPtin (JANUVIA) 50 MG tablet Take 1 tablet (50 mg total) by mouth daily.      Review of systems complete and found to be negative unless listed above in HPI    Physical exam Blood pressure (!) 143/79, pulse 67, temperature (!) 96 F (35.6 C), height 6' (1.829 m), weight 183 lb (83 kg), SpO2 99 %. General: NAD Neck: No JVD, no thyromegaly or thyroid nodule.  Lungs: Clear to auscultation bilaterally with normal respiratory effort. CV: Nondisplaced PMI. Regular rate and rhythm, normal S1/S2, no S3/S4, no murmur.  No peripheral edema.  No carotid bruit.    Abdomen: Soft, nontender, no distention.  Skin: Intact without lesions or rashes.  Neurologic: Alert and oriented x 3.  Psych:  Normal affect. Extremities: No clubbing or cyanosis.  HEENT: Normal.      Labs: Lab Results  Component Value Date/Time   K 4.3 01/29/2019 10:29 AM   BUN 23 01/29/2019 10:29 AM   BUN 18 05/05/2017 12:00 AM   CREATININE 1.98 (H) 01/29/2019 10:29 AM   ALT 16 01/29/2019 10:29 AM   TSH 1.48 05/05/2017 12:00 AM   HGB 12.1 (L) 01/29/2019 10:29 AM     Lipids: Lab Results  Component Value Date/Time   LDLCALC 117 05/05/2017 12:00 AM   CHOL 191 05/05/2017 12:00 AM   TRIG  106 05/05/2017 12:00 AM   HDL 53 05/05/2017 12:00 AM        ASSESSMENT AND PLAN:  1.  Abnormal ECG/first-degree AV block: I personally reviewed the ECG performed on 05/25/2019 which demonstrated sinus rhythm with first-degree AV block, PR interval 244 ms.  There was evidence for LVH with consequent repolarization abnormalities and QRS widening.  LVH indicative of longstanding hypertension. I compared this to an ECG performed on 05/12/2013 which I also personally reviewed which demonstrated sinus rhythm with LVH, Axis was -27 degrees compared to the QRS axis on 05/25/2019 which is -42 degrees.  PR interval was 194 ms. He is entirely asymptomatic.  He appears to have good exercise tolerance.  Simple watchful waiting for the potential development of symptoms is all that is warranted in his case.  No cardiac testing is indicated at this time.  2.  Hypertension: Blood pressure is mildly elevated.  This is being managed by his PCP.     Disposition: Follow up as needed  Signed: Kate Sable, M.D., F.A.C.C.  06/01/2019, 1:14 PM

## 2019-06-05 ENCOUNTER — Ambulatory Visit: Payer: Medicare Other | Attending: Internal Medicine

## 2019-06-05 ENCOUNTER — Other Ambulatory Visit: Payer: Self-pay

## 2019-06-05 DIAGNOSIS — Z23 Encounter for immunization: Secondary | ICD-10-CM

## 2019-06-05 NOTE — Progress Notes (Signed)
   Covid-19 Vaccination Clinic  Name:  Santi Troung    MRN: [id number] DOB: [birth year]  06/05/2019  Mr. Briski was observed post Covid-19 immunization for 15 minutes without incidence. He was provided with Vaccine Information Sheet and instruction to access the V-Safe system.   Mr. Eyer was instructed to call 911 with any severe reactions post vaccine: Marland Kitchen Difficulty breathing  . Swelling of your face and throat  . A fast heartbeat  . A bad rash all over your body  . Dizziness and weakness    Immunizations Administered    Name Date Dose VIS Date Route   Moderna COVID-19 Vaccine 06/05/2019 10:10 AM 0.5 mL 03/30/2019 Intramuscular   Manufacturer: Moderna   Lot: [id number]   [place]: [id number]

## 2019-06-09 ENCOUNTER — Other Ambulatory Visit: Payer: Self-pay

## 2019-06-09 ENCOUNTER — Encounter: Payer: Self-pay | Admitting: Family Medicine

## 2019-06-09 ENCOUNTER — Ambulatory Visit (INDEPENDENT_AMBULATORY_CARE_PROVIDER_SITE_OTHER): Payer: Medicare Other | Admitting: Family Medicine

## 2019-06-09 VITALS — BP 120/60 | HR 50 | Temp 98.3°F | Ht 72.0 in | Wt 183.0 lb

## 2019-06-09 DIAGNOSIS — E1121 Type 2 diabetes mellitus with diabetic nephropathy: Secondary | ICD-10-CM

## 2019-06-09 DIAGNOSIS — I10 Essential (primary) hypertension: Secondary | ICD-10-CM

## 2019-06-09 DIAGNOSIS — E1159 Type 2 diabetes mellitus with other circulatory complications: Secondary | ICD-10-CM

## 2019-06-09 DIAGNOSIS — I152 Hypertension secondary to endocrine disorders: Secondary | ICD-10-CM

## 2019-06-09 DIAGNOSIS — N183 Chronic kidney disease, stage 3 unspecified: Secondary | ICD-10-CM

## 2019-06-09 MED ORDER — FAMOTIDINE 20 MG PO TABS: 20.0000 mg | ORAL_TABLET | Freq: Two times a day (BID) | ORAL | 3 refills | Status: DC

## 2019-06-09 NOTE — Progress Notes (Signed)
Established Patient Office Visit  Subjective:  Patient ID: Richard Swanson, male    DOB: 11-Aug-1934  Age: 85 y.o. MRN: [id number]  CC:  Chief Complaint  Patient presents with  . Diabetes  . Hypertension  . Follow-up    HPI Richard Swanson presents for DM-HTN A1c 6.9%,  Past Medical History:  Diagnosis Date  . Cancer [facility])    Prostate  . Chronic obstructive pulmonary disease, unspecified ([place])   . Diabetes mellitus without complication ([place])   . Dizziness   . Gastro-esophageal reflux disease without esophagitis   . Glaucoma   . Glomerular disorders in diseases classified elsewhere   . Gross hematuria   . Hypertension   . Hypertensive chronic kidney disease with stage 1 through stage 4 chronic kidney disease, or unspecified chronic kidney disease   . Kidney disease, chronic, stage III (moderate, EGFR 30-59 ml/min)   . Malignant neoplasm of prostate ([place])   . Prostate cancer ([place]) 11/03/2010   dx 2006  . Type 2 diabetes mellitus with diabetic nephropathy ([place])   . Unspecified kidney failure     Past Surgical History:  Procedure Laterality Date  . PROSTATE SURGERY      Family History  Problem Relation Age of Onset  . Stroke Father     Social History   Socioeconomic History  . Marital status: Married    Spouse name: Not on file  . Number of children: Not on file  . Years of education: Not on file  . Highest education level: Not on file  Occupational History  . Not on file  Tobacco Use  . Smoking status: Never Smoker  . Smokeless tobacco: Never Used  Substance and Sexual Activity  . Alcohol use: No  . Drug use: No  . Sexual activity: Not on file  Other Topics Concern  . Not on file  Social History Narrative  . Not on file   Social Determinants of Health   Financial Resource Strain:   . Difficulty of Paying Living Expenses: Not on file  Food Insecurity:   . Worried About Charity fundraiser in the Last Year: Not on file  . Ran Out of Food in the  Last Year: Not on file  Transportation Needs:   . Lack of Transportation (Medical): Not on file  . Lack of Transportation (Non-Medical): Not on file  Physical Activity:   . Days of Exercise per Week: Not on file  . Minutes of Exercise per Session: Not on file  Stress:   . Feeling of Stress : Not on file  Social Connections:   . Frequency of Communication with Friends and Family: Not on file  . Frequency of Social Gatherings with Friends and Family: Not on file  . Attends Religious Services: Not on file  . Active Member of Clubs or Organizations: Not on file  . Attends Archivist Meetings: Not on file  . Marital Status: Not on file  Intimate Partner Violence:   . Fear of Current or Ex-Partner: Not on file  . Emotionally Abused: Not on file  . Physically Abused: Not on file  . Sexually Abused: Not on file    Outpatient Medications Prior to Visit  Medication Sig Dispense Refill  . amLODipine (NORVASC) 10 MG tablet Take 10 mg by mouth daily.    . COMBIGAN 0.2-0.5 % ophthalmic solution 1 drop every 12 (twelve) hours.     . famotidine (PEPCID) 20 MG tablet Take 1 tablet (20 mg total) by  mouth 2 (two) times daily. 60 tablet 1  . lisinopril (ZESTRIL) 20 MG tablet Take 1 tablet (20 mg total) by mouth daily. 30 tablet 3  . metoprolol tartrate (LOPRESSOR) 25 MG tablet Take 25 mg by mouth daily.    . rosuvastatin (CRESTOR) 5 MG tablet Take 1 tablet (5 mg total) by mouth daily. 30 tablet 3  . sitaGLIPtin (JANUVIA) 50 MG tablet Take 1 tablet (50 mg total) by mouth daily. 30 tablet 3   No facility-administered medications prior to visit.    No Known Allergies  ROS Review of Systems  Cardiovascular:       HTN  Endocrine:       DM      Objective:    Physical Exam  HENT:  Head: Normocephalic and atraumatic.  Cardiovascular: Normal rate, regular rhythm and normal heart sounds.  Pulmonary/Chest: Effort normal and breath sounds normal.   Discussion about medications BP  120/60 (BP Location: Left Arm, Patient Position: Sitting, Cuff Size: Normal)   Pulse (!) 50   Temp 98.3 F (36.8 C) (Oral)   Ht 6' (1.829 m)   Wt 183 lb (83 kg)   SpO2 99%   BMI 24.82 kg/m  Wt Readings from Last 3 Encounters:  06/09/19 183 lb (83 kg)  06/01/19 183 lb (83 kg)  05/31/19 186 lb 9.6 oz (84.6 kg)     Health Maintenance Due  Topic Date Due  . OPHTHALMOLOGY EXAM  07/18/1946  . TETANUS/TDAP  07/18/1955    Lab Results  Component Value Date   TSH 1.48 05/05/2017   Lab Results  Component Value Date   WBC 6.4 01/29/2019   HGB 12.1 (L) 01/29/2019   HCT 35.4 (L) 01/29/2019   MCV 80.6 01/29/2019   PLT 157 01/29/2019   Lab Results  Component Value Date   NA 136 01/29/2019   K 4.3 01/29/2019   CO2 26 01/29/2019   GLUCOSE 183 (H) 01/29/2019   BUN 23 01/29/2019   CREATININE 1.98 (H) 01/29/2019   BILITOT 0.9 01/29/2019   ALKPHOS 49 01/29/2019   AST 17 01/29/2019   ALT 16 01/29/2019   PROT 7.3 01/29/2019   ALBUMIN 4.1 01/29/2019   CALCIUM 8.8 (L) 01/29/2019   ANIONGAP 8 01/29/2019   Lab Results  Component Value Date   CHOL 191 05/05/2017   Lab Results  Component Value Date   HDL 53 05/05/2017   Lab Results  Component Value Date   LDLCALC 117 05/05/2017   Lab Results  Component Value Date   TRIG 106 05/05/2017   No results found for: [facility] Lab Results  Component Value Date   HGBA1C 6.9 (A) 05/25/2019      Assessment & Plan:  1. Hypertension associated with diabetes ([place]) 157/67 2/2, 148/69, 149/67 2/3- 135/66, 123/63 Amlodipine am/lisinopril pam, metoprolol pm 2. Type 2 diabetes mellitus with diabetic nephropathy, without long-term current use of insulin (H[facility]) A1c  6.9% 1/26-Januvia 59m daily, diet modification Follow-up: 07/2019-fasting blood work with appt   Cyndal Kasson LHannah Beat MD

## 2019-06-09 NOTE — Patient Instructions (Addendum)
Lipid panel with other blood work in [employer] -take in the morning Continue amlodipine-take in morning Continue lisinopril-take in the evening Continue metoprolol-take in the evening Continue Crestor-take in the evening Continue Pepcid -take in the morning  Take tylenol for pain or headache NOT ibuprofen or Aleve

## 2019-06-18 ENCOUNTER — Other Ambulatory Visit: Payer: Self-pay | Admitting: Family Medicine

## 2019-06-21 ENCOUNTER — Encounter: Payer: Self-pay | Admitting: Family Medicine

## 2019-07-06 ENCOUNTER — Ambulatory Visit: Payer: Medicare Other | Attending: Internal Medicine

## 2019-07-06 DIAGNOSIS — Z23 Encounter for immunization: Secondary | ICD-10-CM | POA: Insufficient documentation

## 2019-07-06 NOTE — Progress Notes (Signed)
   Covid-19 Vaccination Clinic  Name:  Richard Swanson    MRN: [id number] DOB: 1934/07/16  07/06/2019  [name] was observed post Covid-19 immunization for 15 minutes without incident. He was provided with Vaccine Information Sheet and instruction to access the V-Safe system.   [name] was instructed to call 911 with any severe reactions post vaccine: Marland Kitchen Difficulty breathing  . Swelling of face and throat  . A fast heartbeat  . A bad rash all over body  . Dizziness and weakness   Immunizations Administered    Name Date Dose VIS Date Route   Moderna COVID-19 Vaccine 07/06/2019  9:56 AM 0.5 mL 03/30/2019 Intramuscular   Manufacturer: Moderna   Lot: [id number]   [place]: [id number]

## 2019-07-15 DIAGNOSIS — Z79899 Other long term (current) drug therapy: Secondary | ICD-10-CM | POA: Diagnosis not present

## 2019-07-15 DIAGNOSIS — D638 Anemia in other chronic diseases classified elsewhere: Secondary | ICD-10-CM | POA: Diagnosis not present

## 2019-07-15 DIAGNOSIS — N184 Chronic kidney disease, stage 4 (severe): Secondary | ICD-10-CM | POA: Diagnosis not present

## 2019-07-15 DIAGNOSIS — R809 Proteinuria, unspecified: Secondary | ICD-10-CM | POA: Diagnosis not present

## 2019-07-15 DIAGNOSIS — E1129 Type 2 diabetes mellitus with other diabetic kidney complication: Secondary | ICD-10-CM | POA: Diagnosis not present

## 2019-07-22 DIAGNOSIS — N189 Chronic kidney disease, unspecified: Secondary | ICD-10-CM | POA: Diagnosis not present

## 2019-07-22 DIAGNOSIS — R809 Proteinuria, unspecified: Secondary | ICD-10-CM | POA: Diagnosis not present

## 2019-07-22 DIAGNOSIS — D631 Anemia in chronic kidney disease: Secondary | ICD-10-CM | POA: Diagnosis not present

## 2019-07-22 DIAGNOSIS — E559 Vitamin D deficiency, unspecified: Secondary | ICD-10-CM | POA: Diagnosis not present

## 2019-07-22 DIAGNOSIS — I129 Hypertensive chronic kidney disease with stage 1 through stage 4 chronic kidney disease, or unspecified chronic kidney disease: Secondary | ICD-10-CM | POA: Diagnosis not present

## 2019-07-26 ENCOUNTER — Other Ambulatory Visit: Payer: Self-pay | Admitting: Family Medicine

## 2019-07-29 ENCOUNTER — Inpatient Hospital Stay (HOSPITAL_COMMUNITY): Payer: Medicare Other | Attending: Hematology

## 2019-07-29 ENCOUNTER — Other Ambulatory Visit: Payer: Self-pay

## 2019-07-29 DIAGNOSIS — N183 Chronic kidney disease, stage 3 unspecified: Secondary | ICD-10-CM | POA: Insufficient documentation

## 2019-07-29 DIAGNOSIS — E1122 Type 2 diabetes mellitus with diabetic chronic kidney disease: Secondary | ICD-10-CM | POA: Insufficient documentation

## 2019-07-29 DIAGNOSIS — Z79899 Other long term (current) drug therapy: Secondary | ICD-10-CM | POA: Insufficient documentation

## 2019-07-29 DIAGNOSIS — C61 Malignant neoplasm of prostate: Secondary | ICD-10-CM | POA: Insufficient documentation

## 2019-07-29 DIAGNOSIS — D631 Anemia in chronic kidney disease: Secondary | ICD-10-CM | POA: Insufficient documentation

## 2019-07-29 DIAGNOSIS — Z7984 Long term (current) use of oral hypoglycemic drugs: Secondary | ICD-10-CM | POA: Diagnosis not present

## 2019-07-29 DIAGNOSIS — I129 Hypertensive chronic kidney disease with stage 1 through stage 4 chronic kidney disease, or unspecified chronic kidney disease: Secondary | ICD-10-CM | POA: Diagnosis not present

## 2019-07-29 LAB — CBC WITH DIFFERENTIAL/PLATELET
Abs Immature Granulocytes: 0.01 10*3/uL (ref 0.00–0.07)
Basophils Absolute: 0 10*3/uL (ref 0.0–0.1)
Basophils Relative: 1 %
Eosinophils Absolute: 0.1 10*3/uL (ref 0.0–0.5)
Eosinophils Relative: 3 %
HCT: 37.4 % — ABNORMAL LOW (ref 39.0–52.0)
Hemoglobin: 12.7 g/dL — ABNORMAL LOW (ref 13.0–17.0)
Immature Granulocytes: 0 %
Lymphocytes Relative: 26 %
Lymphs Abs: 1.1 10*3/uL (ref 0.7–4.0)
MCH: 27.4 pg (ref 26.0–34.0)
MCHC: 34 g/dL (ref 30.0–36.0)
MCV: 80.8 fL (ref 80.0–100.0)
Monocytes Absolute: 0.3 10*3/uL (ref 0.1–1.0)
Monocytes Relative: 8 %
Neutro Abs: 2.7 10*3/uL (ref 1.7–7.7)
Neutrophils Relative %: 62 %
Platelets: 175 10*3/uL (ref 150–400)
RBC: 4.63 MIL/uL (ref 4.22–5.81)
RDW: 12.8 % (ref 11.5–15.5)
WBC: 4.3 10*3/uL (ref 4.0–10.5)
nRBC: 0 % (ref 0.0–0.2)

## 2019-07-29 LAB — COMPREHENSIVE METABOLIC PANEL
ALT: 22 U/L (ref 0–44)
AST: 18 U/L (ref 15–41)
Albumin: 4.4 g/dL (ref 3.5–5.0)
Alkaline Phosphatase: 53 U/L (ref 38–126)
Anion gap: 9 (ref 5–15)
BUN: 38 mg/dL — ABNORMAL HIGH (ref 8–23)
CO2: 25 mmol/L (ref 22–32)
Calcium: 9.4 mg/dL (ref 8.9–10.3)
Chloride: 104 mmol/L (ref 98–111)
Creatinine, Ser: 2.31 mg/dL — ABNORMAL HIGH (ref 0.61–1.24)
GFR calc Af Amer: 29 mL/min — ABNORMAL LOW (ref >60)
GFR calc non Af Amer: 25 mL/min — ABNORMAL LOW (ref >60)
Glucose, Bld: 168 mg/dL — ABNORMAL HIGH (ref 70–99)
Potassium: 4.8 mmol/L (ref 3.5–5.1)
Sodium: 138 mmol/L (ref 135–145)
Total Bilirubin: 0.8 mg/dL (ref 0.3–1.2)
Total Protein: 7.8 g/dL (ref 6.5–8.1)

## 2019-07-29 LAB — LACTATE DEHYDROGENASE: LDH: 156 U/L (ref 98–192)

## 2019-07-29 LAB — VITAMIN B12: Vitamin B-12: 593 pg/mL (ref 180–914)

## 2019-07-29 LAB — VITAMIN D 25 HYDROXY (VIT D DEFICIENCY, FRACTURES): Vit D, 25-Hydroxy: 33.17 ng/mL (ref 30–100)

## 2019-07-29 LAB — PSA: Prostatic Specific Antigen: <0.01 ng/mL (ref 0.00–4.00)

## 2019-08-05 ENCOUNTER — Encounter (HOSPITAL_COMMUNITY): Payer: Self-pay | Admitting: Nurse Practitioner

## 2019-08-05 ENCOUNTER — Other Ambulatory Visit: Payer: Self-pay

## 2019-08-05 ENCOUNTER — Inpatient Hospital Stay (HOSPITAL_BASED_OUTPATIENT_CLINIC_OR_DEPARTMENT_OTHER): Payer: Medicare Other | Admitting: Nurse Practitioner

## 2019-08-05 DIAGNOSIS — Z79899 Other long term (current) drug therapy: Secondary | ICD-10-CM | POA: Diagnosis not present

## 2019-08-05 DIAGNOSIS — D631 Anemia in chronic kidney disease: Secondary | ICD-10-CM | POA: Diagnosis not present

## 2019-08-05 DIAGNOSIS — I129 Hypertensive chronic kidney disease with stage 1 through stage 4 chronic kidney disease, or unspecified chronic kidney disease: Secondary | ICD-10-CM | POA: Diagnosis not present

## 2019-08-05 DIAGNOSIS — Z7984 Long term (current) use of oral hypoglycemic drugs: Secondary | ICD-10-CM | POA: Diagnosis not present

## 2019-08-05 DIAGNOSIS — E1122 Type 2 diabetes mellitus with diabetic chronic kidney disease: Secondary | ICD-10-CM | POA: Diagnosis not present

## 2019-08-05 DIAGNOSIS — C61 Malignant neoplasm of prostate: Secondary | ICD-10-CM

## 2019-08-05 DIAGNOSIS — N183 Chronic kidney disease, stage 3 unspecified: Secondary | ICD-10-CM | POA: Diagnosis not present

## 2019-08-05 NOTE — Progress Notes (Signed)
[place], [place] [id number]   CLINIC:  Medical Oncology/Hematology  PCP:  Maryruth Hancock, [place] [address] [phone]   REASON FOR VISIT: Follow-up for prostate cancer  CURRENT THERAPY: Observation   INTERVAL HISTORY:  Mr. Milian 85 y.o. male returns for routine follow-up for prostate cancer.  Patient reports he has been doing well since last visit he denies any new bone pain or abdominal pain. Denies any nausea, vomiting, or diarrhea. Denies any new pains. Had not noticed any recent bleeding such as epistaxis, hematuria or hematochezia. Denies recent chest pain on exertion, shortness of breath on minimal exertion, pre-syncopal episodes, or palpitations. Denies any numbness or tingling in hands or feet. Denies any recent fevers, infections, or recent hospitalizations. Patient reports appetite at 100% and energy level at 100%.  He is eating well maintain his weight at this time.     REVIEW OF SYSTEMS:  Review of Systems  All other systems reviewed and are negative.    PAST MEDICAL/SURGICAL HISTORY:  Past Medical History:  Diagnosis Date  . Cancer [facility])    Prostate  . Chronic obstructive pulmonary disease, unspecified ([place])   . Diabetes mellitus without complication ([place])   . Dizziness   . Gastro-esophageal reflux disease without esophagitis   . Glaucoma   . Glomerular disorders in diseases classified elsewhere   . Gross hematuria   . Hypertension   . Hypertensive chronic kidney disease with stage 1 through stage 4 chronic kidney disease, or unspecified chronic kidney disease   . Kidney disease, chronic, stage III (moderate, EGFR 30-59 ml/min)   . Malignant neoplasm of prostate ([place])   . Prostate cancer ([place]) 11/03/2010   dx 2006  . Type 2 diabetes mellitus with diabetic nephropathy ([place])   . Unspecified kidney failure    Past Surgical History:  Procedure Laterality Date  . PROSTATE SURGERY        SOCIAL HISTORY:  Social History   Socioeconomic History  . Marital status: Married    Spouse name: Not on file  . Number of children: Not on file  . Years of education: Not on file  . Highest education level: Not on file  Occupational History  . Not on file  Tobacco Use  . Smoking status: Never Smoker  . Smokeless tobacco: Never Used  Substance and Sexual Activity  . Alcohol use: No  . Drug use: No  . Sexual activity: Not on file  Other Topics Concern  . Not on file  Social History Narrative  . Not on file   Social Determinants of Health   Financial Resource Strain:   . Difficulty of Paying Living Expenses:   Food Insecurity:   . Worried About Charity fundraiser in the Last Year:   . Arboriculturist in the Last Year:   Transportation Needs:   . Film/video editor (Medical):   Marland Kitchen Lack of Transportation (Non-Medical):   Physical Activity:   . Days of Exercise per Week:   . Minutes of Exercise per Session:   Stress:   . Feeling of Stress :   Social Connections:   . Frequency of Communication with Friends and Family:   . Frequency of Social Gatherings with Friends and Family:   . Attends Religious Services:   . Active Member of Clubs or Organizations:   . Attends Archivist Meetings:   Marland Kitchen Marital Status:   Intimate Partner Violence:   .  Fear of Current or Ex-Partner:   . Emotionally Abused:   Marland Kitchen Physically Abused:   . Sexually Abused:     FAMILY HISTORY:  Family History  Problem Relation Age of Onset  . Stroke Father     CURRENT MEDICATIONS:  Outpatient Encounter Medications as of 08/05/2019  Medication Sig  . amLODipine (NORVASC) 10 MG tablet Take 10 mg by mouth daily.  . famotidine (PEPCID) 20 MG tablet Take 1 tablet (20 mg total) by mouth 2 (two) times daily.  Marland Kitchen lisinopril (ZESTRIL) 20 MG tablet Take 1 tablet (20 mg total) by mouth daily.  . metoprolol tartrate (LOPRESSOR) 25 MG tablet Take 25 mg by mouth daily.  . rosuvastatin  (CRESTOR) 5 MG tablet Take 1 tablet (5 mg total) by mouth daily.  . sitaGLIPtin (JANUVIA) 50 MG tablet Take 1 tablet (50 mg total) by mouth daily.  Marland Kitchen VYZULTA 0.024 % SOLN Apply 1 drop to eye at bedtime.  . [DISCONTINUED] COMBIGAN 0.2-0.5 % ophthalmic solution 1 drop every 12 (twelve) hours.   . [DISCONTINUED] metoprolol succinate (TOPROL-XL) 25 MG 24 hr tablet TAKE 1 TABLET BY MOUTH EVERY DAY.   No facility-administered encounter medications on file as of 08/05/2019.    ALLERGIES:  No Known Allergies   PHYSICAL EXAM:  ECOG Performance status: 1  Vitals:   08/05/19 1100  BP: (!) 129/58  Pulse: (!) 51  Resp: 16  Temp: (!) 96.9 F (36.1 C)  SpO2: 100%   Filed Weights   08/05/19 1100  Weight: 181 lb 9 oz (82.4 kg)    Physical Exam Constitutional:      Appearance: Normal appearance. He is normal weight.  Cardiovascular:     Rate and Rhythm: Normal rate and regular rhythm.     Heart sounds: Normal heart sounds.  Pulmonary:     Effort: Pulmonary effort is normal.     Breath sounds: Normal breath sounds.  Abdominal:     General: Bowel sounds are normal.     Palpations: Abdomen is soft.  Musculoskeletal:        General: Normal range of motion.  Skin:    General: Skin is warm.  Neurological:     Mental Status: He is alert and oriented to person, place, and time. Mental status is at baseline.  Psychiatric:        Mood and Affect: Mood normal.        Behavior: Behavior normal.        Thought Content: Thought content normal.        Judgment: Judgment normal.      LABORATORY DATA:  I have reviewed the labs as listed.  CBC    Component Value Date/Time   WBC 4.3 07/29/2019 1115   RBC 4.63 07/29/2019 1115   HGB 12.7 (L) 07/29/2019 1115   HCT 37.4 (L) 07/29/2019 1115   PLT 175 07/29/2019 1115   MCV 80.8 07/29/2019 1115   MCH 27.4 07/29/2019 1115   MCHC 34.0 07/29/2019 1115   RDW 12.8 07/29/2019 1115   LYMPHSABS 1.1 07/29/2019 1115   MONOABS 0.3 07/29/2019 1115    EOSABS 0.1 07/29/2019 1115   BASOSABS 0.0 07/29/2019 1115   CMP Latest Ref Rng & Units 07/29/2019 05/14/2019 01/29/2019  Glucose 70 - 99 mg/dL 168(H) - 183(H)  BUN 8 - 23 mg/dL 38(H) 26(A) 23  Creatinine 0.61 - 1.24 mg/dL 2.31(H) 2.4(A) 1.98(H)  Sodium 135 - 145 mmol/L 138 140 136  Potassium 3.5 - 5.1 mmol/L 4.8 4.0 4.3  Chloride 98 - 111 mmol/L 104 103 102  CO2 22 - 32 mmol/L 25 21 26   Calcium 8.9 - 10.3 mg/dL 9.4 9.7 8.8(L)  Total Protein 6.5 - 8.1 g/dL 7.8 - 7.3  Total Bilirubin 0.3 - 1.2 mg/dL 0.8 - 0.9  Alkaline Phos 38 - 126 U/L 53 - 49  AST 15 - 41 U/L 18 - 17  ALT 0 - 44 U/L 22 - 16   .  I personally performed a face-to-face visit,   All questions were answered to patient's stated satisfaction. Encouraged patient to call with any new concerns or questions before his next visit to the cancer center and we can certain see him sooner, if needed.     ASSESSMENT & PLAN:   Prostate cancer ([place]) 1.  Prostate cancer: - Gleason score of 5. -Patient is status post radiation by Dr. Valere Dross on 08/01/2010 through 09/14/2010 - Patient is status post radical prostatectomy in 06/2004 by Dr. Michela Pitcher. -PSA levels have all remained 0 since 2007. -Labs on 07/29/2019 showed PSA was 0.  Potassium 4.8, hemoglobin 12.7, WBC 4.3, platelets 175.  Alkaline phosphatase 53 -He is advised to notify the office if he has any issues such as abdominal pain, weight loss, shortness of breath, or worsening bone pain prior to his return. - We will return to clinic in 6 months with labs a week before.  2.  Chronic kidney disease: - His creatinine stays around 1.5-2.0. -Labs on 07/29/2019 showed his creatinine 2.31 -He is being followed by nephrology.  3.  Anemia in the setting of CKD: - His hemoglobin usually runs from 11.8-13.0. - Labs on 07/29/2019 showed his hemoglobin at 12.7, platelets at 175. - He will continue to follow-up with nephrology.      Orders placed this encounter:  Orders Placed This Encounter   Procedures  . PSA  . Lactate dehydrogenase  . CBC with Differential/Platelet  . Comprehensive metabolic panel  . Ferritin  . Iron and TIBC  . Vitamin B12  . VITAMIN D 25 Hydroxy (Vit-D Deficiency, Fractures)  . Folate      Francene Finders, FNP-C [place] [phone]

## 2019-08-05 NOTE — Assessment & Plan Note (Signed)
1.  Prostate cancer: - Gleason score of 5. -Patient is status post radiation by Dr. Valere Dross on 08/01/2010 through 09/14/2010 - Patient is status post radical prostatectomy in 06/2004 by Dr. Michela Pitcher. -PSA levels have all remained 0 since 2007. -Labs on 07/29/2019 showed PSA was 0.  Potassium 4.8, hemoglobin 12.7, WBC 4.3, platelets 175.  Alkaline phosphatase 53 -He is advised to notify the office if he has any issues such as abdominal pain, weight loss, shortness of breath, or worsening bone pain prior to his return. - We will return to clinic in 6 months with labs a week before.  2.  Chronic kidney disease: - His creatinine stays around 1.5-2.0. -Labs on 07/29/2019 showed his creatinine 2.31 -He is being followed by nephrology.  3.  Anemia in the setting of CKD: - His hemoglobin usually runs from 11.8-13.0. - Labs on 07/29/2019 showed his hemoglobin at 12.7, platelets at 175. - He will continue to follow-up with nephrology.

## 2019-08-05 NOTE — Patient Instructions (Signed)
[facility] Discharge Instructions  Follow up in 6 months with labs    Thank you for choosing  Can[facility] Center at [facility] to provide your oncology and hematology care.  To afford each patient quality time with our provider, please arrive at least 15 minutes before your scheduled appointment time.   If you have a lab appointment with the Cancer Center please come in thru the Main Entrance and check in at the main information desk.  You need to re-schedule your appointment should you arrive 10 or more minutes late.  We strive to give you quality time with our providers, and arriving late affects you and other patients whose appointments are after yours.  Also, if you no show three or more times for appointments you may be dismissed from the clinic at the providers discretion.     Again, thank you for choosing [facility].  Our hope is that these requests will decrease the amount of time that you wait before being seen by our physicians.       _____________________________________________________________  Should you have questions after your visit to [facility], please contact our office at [phone] between the hours of 8:00 a.m. and 4:30 p.m.  Voicemails left after 4:00 p.m. will not be returned until the following business day.  For prescription refill requests, have your pharmacy contact our office and allow 72 hours.    Due to Covid, you will need to wear a mask upon entering the hospital. If you do not have a mask, a mask will be given to you at the Main Entrance upon arrival. For doctor visits, patients may have 1 support person with them. For treatment visits, patients can not have anyone with them due to social distancing guidelines and our immunocompromised population.      

## 2019-08-18 ENCOUNTER — Other Ambulatory Visit: Payer: Self-pay | Admitting: "Endocrinology

## 2019-08-19 ENCOUNTER — Telehealth: Payer: Self-pay | Admitting: Family Medicine

## 2019-08-19 ENCOUNTER — Ambulatory Visit (INDEPENDENT_AMBULATORY_CARE_PROVIDER_SITE_OTHER): Payer: Medicare Other | Admitting: Family Medicine

## 2019-08-19 ENCOUNTER — Encounter: Payer: Self-pay | Admitting: Family Medicine

## 2019-08-19 ENCOUNTER — Other Ambulatory Visit: Payer: Self-pay

## 2019-08-19 VITALS — BP 140/60 | HR 61 | Temp 97.9°F | Ht 72.0 in | Wt 182.4 lb

## 2019-08-19 DIAGNOSIS — K219 Gastro-esophageal reflux disease without esophagitis: Secondary | ICD-10-CM

## 2019-08-19 DIAGNOSIS — E1121 Type 2 diabetes mellitus with diabetic nephropathy: Secondary | ICD-10-CM | POA: Diagnosis not present

## 2019-08-19 DIAGNOSIS — N183 Chronic kidney disease, stage 3 unspecified: Secondary | ICD-10-CM

## 2019-08-19 DIAGNOSIS — J449 Chronic obstructive pulmonary disease, unspecified: Secondary | ICD-10-CM | POA: Diagnosis not present

## 2019-08-19 DIAGNOSIS — J301 Allergic rhinitis due to pollen: Secondary | ICD-10-CM | POA: Diagnosis not present

## 2019-08-19 MED ORDER — METOPROLOL TARTRATE 25 MG PO TABS: 25.0000 mg | ORAL_TABLET | Freq: Every day | ORAL | 1 refills | Status: DC

## 2019-08-19 NOTE — Telephone Encounter (Signed)
Pt is coming in at 1020 today.

## 2019-08-19 NOTE — Telephone Encounter (Signed)
Patient came into the office today thinking he had an appointment but his appointment was for July, which was canceled due to Dr. Holly Bodily leaving.   He brought in a medication bottle that Dr. Holly Bodily prescribed in march and he is needing a refill.  Metoprolol Succ ER 25mg  TAB  [facility] - [place], [place] Phone:  [phone]  Fax:  [phone]

## 2019-08-19 NOTE — Telephone Encounter (Signed)
noted 

## 2019-08-19 NOTE — Patient Instructions (Addendum)
Zestril(lisinopril) for blood pressure and to protect kidneys-20mg  Take at night Amlodipine(Norvasc) for blood pressure-10mg  Take at night Metoprolol(Lopressor)for blood pressure- 25mg -Take in the morning Famotidine (Pepcid )for acid reflux-Take twice a day Crestor(Rosuvastatin) for cholesterol-Take at night Januvia(Sitagliptin) for diabetes-Take in the morning  Try claritin 10mg  daily for nasal congestion(runny nose)      If you have lab work done today you will be contacted with your lab results within the next 2 weeks.  If you have not heard from [place] then please contact us. The fastest way to get your results is to register for My Chart.   IF you received an x-ray today, you will receive an invoice from [facility]. Please contact [facility] at [phone] with questions or concerns regarding your invoice.   IF you received labwork today, you will receive an invoice from [facility]. Please contact [facility] at [phone] with questions or concerns regarding your invoice.   Our billing staff will not be able to assist you with questions regarding bills from these companies.  You will be contacted with the lab results as soon as they are available. The fastest way to get your results is to activate your My Chart account. Instructions are located on the last page of this paperwork. If you have not heard from [place] regarding the results in 2 weeks, please contact this office.

## 2019-08-19 NOTE — Progress Notes (Signed)
Established Patient Office Visit  Subjective:  Patient ID: Richard [place], male    DOB: 03-22-1935  Age: 85 y.o. MRN: [id number]  CC:  Chief Complaint  Patient presents with  . Hypertension    follow up on BP and refill on meds. Last labs was done on 07/29/19 by cancer doctor    HPI Draylen Lobue presents for HTN-129/65-takes lisinpril/metoprolol/Norvasc daily Prostate CA--no difficulty with urination Hyperlipidemia-Crestor-taking daily GERD-pepcid BID improved gllaucoma-drops daily Past Medical History:  Diagnosis Date  . Cancer [facility])    Prostate  . Chronic obstructive pulmonary disease, unspecified ([place])   . Diabetes mellitus without complication ([place])   . Dizziness   . Gastro-esophageal reflux disease without esophagitis   . Glaucoma   . Glomerular disorders in diseases classified elsewhere   . Gross hematuria   . Hypertension   . Hypertensive chronic kidney disease with stage 1 through stage 4 chronic kidney disease, or unspecified chronic kidney disease   . Kidney disease, chronic, stage III (moderate, EGFR 30-59 ml/min)   . Malignant neoplasm of prostate ([place])   . Prostate cancer ([place]) 11/03/2010   dx 2006  . Type 2 diabetes mellitus with diabetic nephropathy ([place])   . Unspecified kidney failure     Past Surgical History:  Procedure Laterality Date  . PROSTATE SURGERY      Family History  Problem Relation Age of Onset  . Stroke Father     Social History   Socioeconomic History  . Marital status: Married    Spouse name: Not on file  . Number of children: Not on file  . Years of education: Not on file  . Highest education level: Not on file  Occupational History  . Not on file  Tobacco Use  . Smoking status: Never Smoker  . Smokeless tobacco: Never Used  Substance and Sexual Activity  . Alcohol use: No  . Drug use: No  . Sexual activity: Not on file  Other Topics Concern  . Not on file  Social History Narrative  . Not on file   Social  Determinants of Health   Financial Resource Strain:   . Difficulty of Paying Living Expenses:   Food Insecurity:   . Worried About Charity fundraiser in the Last Year:   . Arboriculturist in the Last Year:   Transportation Needs:   . Film/video editor (Medical):   Marland Kitchen Lack of Transportation (Non-Medical):   Physical Activity:   . Days of Exercise per Week:   . Minutes of Exercise per Session:   Stress:   . Feeling of Stress :   Social Connections:   . Frequency of Communication with Friends and Family:   . Frequency of Social Gatherings with Friends and Family:   . Attends Religious Services:   . Active Member of Clubs or Organizations:   . Attends Archivist Meetings:   Marland Kitchen Marital Status:   Intimate Partner Violence:   . Fear of Current or Ex-Partner:   . Emotionally Abused:   Marland Kitchen Physically Abused:   . Sexually Abused:     Outpatient Medications Prior to Visit  Medication Sig Dispense Refill  . amLODipine (NORVASC) 10 MG tablet Take 10 mg by mouth daily.    . famotidine (PEPCID) 20 MG tablet Take 1 tablet (20 mg total) by mouth 2 (two) times daily. 180 tablet 3  . lisinopril (ZESTRIL) 20 MG tablet Take 1 tablet (20 mg total) by mouth daily. 30 tablet 3  .  rosuvastatin (CRESTOR) 5 MG tablet Take 1 tablet (5 mg total) by mouth daily. 30 tablet 3  . sitaGLIPtin (JANUVIA) 50 MG tablet Take 1 tablet (50 mg total) by mouth daily. 30 tablet 3  . VYZULTA 0.024 % SOLN Apply 1 drop to eye at bedtime.    . metoprolol tartrate (LOPRESSOR) 25 MG tablet Take 25 mg by mouth daily.     No facility-administered medications prior to visit.    No Known Allergies  ROS Review of Systems  Constitutional: Negative.   Eyes:       Glaucoma  Respiratory: Negative.   Cardiovascular: Negative.   Gastrointestinal:       GERD  Genitourinary: Negative.   Psychiatric/Behavioral: Negative.       Objective:    Physical Exam  Constitutional: He is oriented to person, place, and  time. He appears well-developed and well-nourished.  HENT:  Head: Normocephalic and atraumatic.  Eyes: Conjunctivae are normal.  Cardiovascular: Normal rate, regular rhythm, normal heart sounds and intact distal pulses.  Pulmonary/Chest: Effort normal and breath sounds normal.  Neurological: He is oriented to person, place, and time.  Psychiatric: He has a normal mood and affect. His behavior is normal.    BP 140/60 (BP Location: Left Arm, Patient Position: Sitting, Cuff Size: Large)   Pulse 61   Temp 97.9 F (36.6 C) (Temporal)   Ht 6' (1.829 m)   Wt 182 lb 6.4 oz (82.7 kg)   SpO2 99%   BMI 24.74 kg/m  Wt Readings from Last 3 Encounters:  08/19/19 182 lb 6.4 oz (82.7 kg)  08/05/19 181 lb 9 oz (82.4 kg)  06/09/19 183 lb (83 kg)     Health Maintenance Due  Topic Date Due  . OPHTHALMOLOGY EXAM  Never done  . TETANUS/TDAP  Never done    Lab Results  Component Value Date   TSH 1.48 05/05/2017   Lab Results  Component Value Date   WBC 4.3 07/29/2019   HGB 12.7 (L) 07/29/2019   HCT 37.4 (L) 07/29/2019   MCV 80.8 07/29/2019   PLT 175 07/29/2019   Lab Results  Component Value Date   NA 138 07/29/2019   K 4.8 07/29/2019   CO2 25 07/29/2019   GLUCOSE 168 (H) 07/29/2019   BUN 38 (H) 07/29/2019   CREATININE 2.31 (H) 07/29/2019   BILITOT 0.8 07/29/2019   ALKPHOS 53 07/29/2019   AST 18 07/29/2019   ALT 22 07/29/2019   PROT 7.8 07/29/2019   ALBUMIN 4.4 07/29/2019   CALCIUM 9.4 07/29/2019   ANIONGAP 9 07/29/2019   Lab Results  Component Value Date   CHOL 191 05/05/2017   Lab Results  Component Value Date   HDL 53 05/05/2017   Lab Results  Component Value Date   LDLCALC 117 05/05/2017   Lab Results  Component Value Date   TRIG 106 05/05/2017   No results found for: [facility] Lab Results  Component Value Date   HGBA1C 6.9 (A) 05/25/2019      Assessment & Plan:  1. Type 2 diabetes mellitus with diabetic nephropathy, without long-term current use of  insulin (HCC) januvia-sees Dr. Dorris Fetch on Monday-elevated glucose off metformin  2. Stage 3 chronic kidney disease, unspecified whether stage 3a or 3b CKD Nephrology   3. Gastro-esophageal reflux disease without esophagitis pepcid  4. Chronic obstructive pulmonary disease, unspecified COPD type (HCC) No -no inhalers  5. Allergic rhinitis due to pollen, unspecified seasonality claritin suggested Meds ordered this encounter  Medications  .  metoprolol tartrate (LOPRESSOR) 25 MG tablet    Sig: Take 1 tablet (25 mg total) by mouth daily.    Dispense:  90 tablet    Refill:  1    Follow-up: Dr. Dicie Beam Hannah Beat, MD

## 2019-08-23 ENCOUNTER — Other Ambulatory Visit: Payer: Self-pay

## 2019-08-23 ENCOUNTER — Telehealth: Payer: Self-pay | Admitting: Family Medicine

## 2019-08-23 ENCOUNTER — Encounter: Payer: Medicare Other | Attending: "Endocrinology | Admitting: Nutrition

## 2019-08-23 ENCOUNTER — Encounter: Payer: Self-pay | Admitting: Nutrition

## 2019-08-23 ENCOUNTER — Ambulatory Visit (INDEPENDENT_AMBULATORY_CARE_PROVIDER_SITE_OTHER): Payer: Medicare Other | Admitting: "Endocrinology

## 2019-08-23 ENCOUNTER — Encounter: Payer: Self-pay | Admitting: "Endocrinology

## 2019-08-23 VITALS — BP 146/70 | HR 56 | Ht 72.0 in | Wt 183.0 lb

## 2019-08-23 VITALS — Wt 183.0 lb

## 2019-08-23 DIAGNOSIS — E782 Mixed hyperlipidemia: Secondary | ICD-10-CM | POA: Diagnosis not present

## 2019-08-23 DIAGNOSIS — E1121 Type 2 diabetes mellitus with diabetic nephropathy: Secondary | ICD-10-CM | POA: Diagnosis not present

## 2019-08-23 DIAGNOSIS — E785 Hyperlipidemia, unspecified: Secondary | ICD-10-CM | POA: Insufficient documentation

## 2019-08-23 DIAGNOSIS — I10 Essential (primary) hypertension: Secondary | ICD-10-CM | POA: Diagnosis not present

## 2019-08-23 DIAGNOSIS — N183 Chronic kidney disease, stage 3 unspecified: Secondary | ICD-10-CM | POA: Insufficient documentation

## 2019-08-23 DIAGNOSIS — I152 Hypertension secondary to endocrine disorders: Secondary | ICD-10-CM

## 2019-08-23 DIAGNOSIS — E1169 Type 2 diabetes mellitus with other specified complication: Secondary | ICD-10-CM | POA: Insufficient documentation

## 2019-08-23 DIAGNOSIS — E1159 Type 2 diabetes mellitus with other circulatory complications: Secondary | ICD-10-CM | POA: Insufficient documentation

## 2019-08-23 LAB — POCT GLYCOSYLATED HEMOGLOBIN (HGB A1C): Hemoglobin A1C: 7.3 % — AB (ref 4.0–5.6)

## 2019-08-23 MED ORDER — GLIPIZIDE ER 5 MG PO TB24: 5.0000 mg | ORAL_TABLET | Freq: Every day | ORAL | 1 refills | Status: DC

## 2019-08-23 NOTE — Patient Instructions (Addendum)
.  Goals  Follow My Plate Don't skip lunch. Eat salad with protein and fruit at lunch Avoid sweet team. Don't eat past 7 pm Keep walking [employer] Follow Low Sodium Handout

## 2019-08-23 NOTE — Telephone Encounter (Signed)
Were this sent in by accident or did you chance this medication

## 2019-08-23 NOTE — Telephone Encounter (Signed)
Incorrect type of metoprolol was placed on pts medication list Please change on medication list to metoprolol (Toprol XL )and sent back to pharmacy at #90 -no refill

## 2019-08-23 NOTE — Patient Instructions (Signed)

## 2019-08-23 NOTE — Telephone Encounter (Signed)
Richard Swanson is calling from [employer] and states that  metoprolol tartrate (LOPRESSOR) 25 MG tablet Was called in. She states the patient has always been on  metoprolol succinate (TOPROL-XL) 25 MG 24 hr tablet  She checked with the patient and the patient was not aware of this change. Beth would like to know if this was supposed to be changed or if the wrong thing was called in.  She can be reached at [phone]

## 2019-08-23 NOTE — Progress Notes (Signed)
08/23/2019, 9:31 AM   Endocrinology follow-up note  Subjective:    Patient ID: Richard Swanson, male    DOB: 12-09-1934.  Richard Swanson is being seen in follow-up after he was seen in consultation for management of currently uncontrolled symptomatic diabetes requested by  Maryruth Hancock, MD.   Past Medical History:  Diagnosis Date  . Cancer [facility])    Prostate  . Chronic obstructive pulmonary disease, unspecified ([place])   . Diabetes mellitus without complication ([place])   . Dizziness   . Gastro-esophageal reflux disease without esophagitis   . Glaucoma   . Glomerular disorders in diseases classified elsewhere   . Gross hematuria   . Hypertension   . Hypertensive chronic kidney disease with stage 1 through stage 4 chronic kidney disease, or unspecified chronic kidney disease   . Kidney disease, chronic, stage III (moderate, EGFR 30-59 ml/min)   . Malignant neoplasm of prostate ([place])   . Prostate cancer ([place]) 11/03/2010   dx 2006  . Type 2 diabetes mellitus with diabetic nephropathy ([place])   . Unspecified kidney failure     Past Surgical History:  Procedure Laterality Date  . PROSTATE SURGERY      Social History   Socioeconomic History  . Marital status: Married    Spouse name: Not on file  . Number of children: Not on file  . Years of education: Not on file  . Highest education level: Not on file  Occupational History  . Not on file  Tobacco Use  . Smoking status: Never Smoker  . Smokeless tobacco: Never Used  Substance and Sexual Activity  . Alcohol use: No  . Drug use: No  . Sexual activity: Not on file  Other Topics Concern  . Not on file  Social History Narrative  . Not on file   Social Determinants of Health   Financial Resource Strain:   . Difficulty of Paying Living Expenses:   Food Insecurity:   . Worried About Charity fundraiser in the Last Year:   . Arboriculturist in the  Last Year:   Transportation Needs:   . Film/video editor (Medical):   Marland Kitchen Lack of Transportation (Non-Medical):   Physical Activity:   . Days of Exercise per Week:   . Minutes of Exercise per Session:   Stress:   . Feeling of Stress :   Social Connections:   . Frequency of Communication with Friends and Family:   . Frequency of Social Gatherings with Friends and Family:   . Attends Religious Services:   . Active Member of Clubs or Organizations:   . Attends Archivist Meetings:   Marland Kitchen Marital Status:     Family History  Problem Relation Age of Onset  . Stroke Father     Outpatient Encounter Medications as of 08/23/2019  Medication Sig  . amLODipine (NORVASC) 10 MG tablet Take 10 mg by mouth daily.  . famotidine (PEPCID) 20 MG tablet Take 1 tablet (20 mg total) by mouth 2 (two) times daily.  Marland Kitchen glipiZIDE (GLUCOTROL XL) 5 MG 24 hr tablet Take 1 tablet (5 mg total) by mouth daily with breakfast.  .  JANUVIA 50 MG tablet TAKE ONE TABLET BY MOUTH ONCE DAILY.  Marland Kitchen lisinopril (ZESTRIL) 20 MG tablet TAKE ONE TABLET BY MOUTH ONCE DAILY.  . metoprolol tartrate (LOPRESSOR) 25 MG tablet Take 1 tablet (25 mg total) by mouth daily.  . rosuvastatin (CRESTOR) 5 MG tablet TAKE ONE TABLET BY MOUTH ONCE DAILY.  Marland Kitchen VYZULTA 0.024 % SOLN Apply 1 drop to eye at bedtime.   No facility-administered encounter medications on file as of 08/23/2019.    ALLERGIES: No Known Allergies  VACCINATION STATUS: Immunization History  Administered Date(s) Administered  . Influenza, High Dose Seasonal PF 03/23/2018  . Influenza,inj,Quad PF,6+ Mos 01/30/2016, 01/29/2017, 01/28/2019  . Moderna SARS-COVID-2 Vaccination 06/05/2019, 07/06/2019  . Pneumococcal Polysaccharide-23 01/28/2019    Diabetes He presents for his follow-up diabetic visit. He has type 2 diabetes mellitus. Onset time: He was diagnosed at approximate age of 41 years. His disease course has been worsening. Pertinent negatives for  hypoglycemia include no confusion, headaches, pallor or seizures. There are no diabetic associated symptoms. Pertinent negatives for diabetes include no chest pain, no fatigue, no polydipsia, no polyphagia, no polyuria and no weakness. There are no hypoglycemic complications. Symptoms are improving. Diabetic complications include nephropathy. Risk factors for coronary artery disease include dyslipidemia and diabetes mellitus. When asked about current treatments, none (He was treated with Janumet for several years until he was stopped recently due to CKD.  He has not taken any medications for diabetes in the last several weeks.) were reported. His weight is fluctuating minimally. He is following a generally unhealthy diet. When asked about meal planning, he reported none. He has not had a previous visit with a dietitian. He rarely participates in exercise. His home blood glucose trend is fluctuating minimally. (He presents with slightly above target fasting glycemic profile.  His point-of-care A1c 7.3%, increasing from 6.9%.  He did not document any hypoglycemia.) An ACE inhibitor/angiotensin II receptor blocker is being taken. Eye exam is current.  Hyperlipidemia This is a chronic problem. The current episode started more than 1 year ago. The problem is uncontrolled. Exacerbating diseases include chronic renal disease and diabetes. Pertinent negatives include no chest pain, myalgias or shortness of breath. He is currently on no antihyperlipidemic treatment. Risk factors for coronary artery disease include dyslipidemia, diabetes mellitus, hypertension, male sex and a sedentary lifestyle.  Hypertension This is a chronic problem. The current episode started more than 1 year ago. The problem is uncontrolled. Pertinent negatives include no chest pain, headaches, neck pain, palpitations or shortness of breath. Risk factors for coronary artery disease include diabetes mellitus, dyslipidemia, male gender and sedentary  lifestyle. Past treatments include ACE inhibitors. Hypertensive end-organ damage includes kidney disease. Identifiable causes of hypertension include chronic renal disease.     Review of Systems  Constitutional: Negative for chills, fatigue, fever and unexpected weight change.  HENT: Negative for dental problem, mouth sores and trouble swallowing.   Eyes: Negative for visual disturbance.  Respiratory: Negative for cough, choking, chest tightness, shortness of breath and wheezing.   Cardiovascular: Negative for chest pain, palpitations and leg swelling.  Gastrointestinal: Negative for abdominal distention, abdominal pain, constipation, diarrhea, nausea and vomiting.  Endocrine: Negative for polydipsia, polyphagia and polyuria.  Genitourinary: Negative for dysuria, flank pain, hematuria and urgency.  Musculoskeletal: Negative for back pain, gait problem, myalgias and neck pain.  Skin: Negative for pallor, rash and wound.  Neurological: Negative for seizures, syncope, weakness, numbness and headaches.  Psychiatric/Behavioral: Negative for confusion and dysphoric mood.  Objective:    Vitals with BMI 08/23/2019 08/23/2019 08/19/2019  Height - 6' 0"  -  Weight 183 lbs 183 lbs -  BMI 49.67 59.16 -  Systolic - 384 665  Diastolic - 70 60  Pulse - 56 -    BP (!) 146/70   Pulse (!) 56   Ht 6' (1.829 m)   Wt 183 lb (83 kg)   BMI 24.82 kg/m   Wt Readings from Last 3 Encounters:  08/23/19 183 lb (83 kg)  08/23/19 183 lb (83 kg)  08/19/19 182 lb 6.4 oz (82.7 kg)     Physical Exam Constitutional:      General: He is not in acute distress.    Appearance: He is well-developed.  HENT:     Head: Normocephalic and atraumatic.  Neck:     Thyroid: No thyromegaly.     Trachea: No tracheal deviation.  Cardiovascular:     Rate and Rhythm: Normal rate.     Pulses:          Dorsalis pedis pulses are 1+ on the right side and 1+ on the left side.       Posterior tibial pulses are 1+ on the  right side and 1+ on the left side.     Heart sounds: S1 normal and S2 normal. No murmur. No gallop.   Pulmonary:     Effort: Pulmonary effort is normal. No respiratory distress.     Breath sounds: No wheezing.  Abdominal:     General: Bowel sounds are normal. There is no distension.     Palpations: Abdomen is soft.     Tenderness: There is no abdominal tenderness. There is no guarding.  Musculoskeletal:     Right shoulder: No swelling or deformity.     Cervical back: Normal range of motion and neck supple.  Skin:    General: Skin is warm and dry.     Findings: No rash.     Nails: There is no clubbing.  Neurological:     Mental Status: He is alert and oriented to person, place, and time.     Cranial Nerves: No cranial nerve deficit.     Sensory: No sensory deficit.     Gait: Gait normal.     Deep Tendon Reflexes: Reflexes are normal and symmetric.  Psychiatric:        Speech: Speech normal.        Behavior: Behavior normal. Behavior is cooperative.        Thought Content: Thought content normal.        Judgment: Judgment normal.     CMP     Component Value Date/Time   NA 138 07/29/2019 1115   NA 140 05/14/2019 0000   K 4.8 07/29/2019 1115   CL 104 07/29/2019 1115   CO2 25 07/29/2019 1115   GLUCOSE 168 (H) 07/29/2019 1115   BUN 38 (H) 07/29/2019 1115   BUN 26 (A) 05/14/2019 0000   CREATININE 2.31 (H) 07/29/2019 1115   CALCIUM 9.4 07/29/2019 1115   PROT 7.8 07/29/2019 1115   ALBUMIN 4.4 07/29/2019 1115   AST 18 07/29/2019 1115   ALT 22 07/29/2019 1115   ALKPHOS 53 07/29/2019 1115   BILITOT 0.8 07/29/2019 1115   GFRNONAA 25 (L) 07/29/2019 1115   GFRAA 29 (L) 07/29/2019 1115    Diabetic Labs (most recent): Lab Results  Component Value Date   HGBA1C 7.3 (A) 08/23/2019   HGBA1C 6.9 (A) 05/25/2019   HGBA1C 6.7 12/19/2017  Lipid Panel ( most recent) Lipid Panel     Component Value Date/Time   CHOL 191 05/05/2017 0000   TRIG 106 05/05/2017 0000   HDL 53  05/05/2017 0000   LDLCALC 117 05/05/2017 0000      Lab Results  Component Value Date   TSH 1.48 05/05/2017      Assessment & Plan:   1. Type 2 diabetes mellitus with diabetic nephropathy, without long-term current use of insulin ([place])  - Quasean Frye has currently uncontrolled symptomatic type 2 DM since 85 years of age.  He presents with slightly above target fasting glycemic profile.  His point-of-care A1c 7.3%, increasing from 6.9%.  He did not document any hypoglycemia. - Recent labs reviewed. - I had a long discussion with him about the progressive nature of diabetes and the pathology behind its complications. -his diabetes is complicated by CKD and he remains at a high risk for more acute and chronic complications which include CAD, CVA, CKD, retinopathy, and neuropathy. These are all discussed in detail with him.  - I have counseled him on diet  and weight management  by adopting a carbohydrate restricted/protein rich diet. Patient is encouraged to switch to  unprocessed or minimally processed     complex starch and increased protein intake (animal or plant source), fruits, and vegetables. -  he is advised to stick to a routine mealtimes to eat 3 meals  a day and avoid unnecessary snacks ( to snack only to correct hypoglycemia).   - he  admits there is a room for improvement in his diet and drink choices. -  Suggestion is made for him to avoid simple carbohydrates  from his diet including Cakes, Sweet Desserts / Pastries, Ice Cream, Soda (diet and regular), Sweet Tea, Candies, Chips, Cookies, Sweet Pastries,  Store Bought Juices, Alcohol in Excess of  1-2 drinks a day, Artificial Sweeteners, Coffee Creamer, and "Sugar-free" Products. This will help patient to have stable blood glucose profile and potentially avoid unintended weight gain.   - he will be scheduled with Jearld Fenton, RDN, CDE for diabetes education.  - I have approached him with the following individualized  plan to manage  his diabetes and patient agrees:   -Based on his A1c of 7.3%, he will not need insulin treatment at this time.  He will continue to benefit from Januvia 50 mg p.o. daily with breakfast.  He will need additional control of postprandial glycemia to target.  He will benefit from low-dose glipizide.  I discussed and added glipizide 5 mg XL p.o. daily with breakfast associated with monitoring of blood glucose twice daily-daily before breakfast and at bedtime.   - he is encouraged to call clinic for blood glucose levels less than 70 or above 200 mg /dl.  - he is not a candidate for metformin, SGLT2 inhibitors due to concurrent renal insufficiency.   - Specific targets for  A1c;  LDL, HDL,  and Triglycerides were discussed with the patient.  2) Blood Pressure /Hypertension:  his blood pressure is not controlled to target.    he is advised to increase lisinopril to 20 mg p.o. daily at breakfast.  He is also on metoprolol 25 mg p.o. daily, as well as amlodipine 10 mg p.o. daily.   3) Lipids/Hyperlipidemia:   Review of his recent lipid panel showed ucontrolled  LDL at 117 .  he is initiated on Crestor 5 mg p.o. nightly.   Side effects and precautions discussed with him.   4)  Weight/Diet:  Body mass index is 24.82 kg/m.  -     he is not a candidate for weight loss.  Exercise, and detailed carbohydrates information provided  -  detailed on discharge instructions.  5) Chronic Care/Health Maintenance:  -he  is on ACEI/ARB and Statin medications and  is encouraged to initiate and continue to follow up with Ophthalmology, Dentist,  Podiatrist at least yearly or according to recommendations, and advised to   stay away from smoking. I have recommended yearly flu vaccine and pneumonia vaccine at least every 5 years; moderate intensity exercise for up to 150 minutes weekly; and  sleep for at least 7 hours a day.  - he is  advised to maintain close follow up with [place], Rex Kras, MD for primary care  needs, as well as his other providers for optimal and coordinated care.   - Time spent on this patient care encounter:  35 min, of which > 50% was spent in  counseling and the rest reviewing his blood glucose logs , discussing his hypoglycemia and hyperglycemia episodes, reviewing his current and  previous labs / studies  ( including abstraction from other facilities) and medications  doses and developing a  long term treatment plan and documenting his care.   Please refer to Patient Instructions for Blood Glucose Monitoring and Insulin/Medications Dosing Guide"  in media tab for additional information. Please  also refer to " Patient Self Inventory" in the Media  tab for reviewed elements of pertinent patient history.  Donne Hazel participated in the discussions, expressed understanding, and voiced agreement with the above plans.  All questions were answered to his satisfaction. he is encouraged to contact clinic should he have any questions or concerns prior to his return visit.   Follow up plan: - Return in about 3 months (around 11/22/2019) for Bring Meter and Logs- A1c in Office.  Glade Lloyd, MD [facility] [address] [place], [place] [address] Phone: [phone]  Fax: [phone]    08/23/2019, 9:31 AM  This note was partially dictated with voice recognition software. Similar sounding words can be transcribed inadequately or may not  be corrected upon review.

## 2019-08-23 NOTE — Progress Notes (Addendum)
Medical Nutrition Therapy:  Appt start time: 0800end time: 0830  Assessment:  Primary concerns today: Diabetes Typd 2 x -10 yrs.  Sees Dr. Dorris Fetch, Endocrinology. Should get A1C rechecked today.  CKD. Creatine 2.31 mg/dl. Sees a nephrologist. Started back to the [facility] for 3 x a week. Goes an hour at a time. Has cut out a lot of processed foods of  Nabs and fast foods. His wife cooks mostly at home. Eats a light lunch. Januvia 50 mg a day.  FBS 160-180's. Can't take Metformin due to CKD. He notes his cardiologist says his heart is fine. Trying to follow a low salt diet. He will be changing over to see Dr. Jerline Pain in [place] for his PCP in July 2021. He needs more fresh fruits and lower carb vegetables. Don't skip lunch.  CMP Latest Ref Rng & Units 07/29/2019 05/14/2019 01/29/2019  Glucose 70 - 99 mg/dL 168(H) - 183(H)  BUN 8 - 23 mg/dL 38(H) 26(A) 23  Creatinine 0.61 - 1.24 mg/dL 2.31(H) 2.4(A) 1.98(H)  Sodium 135 - 145 mmol/L 138 140 136  Potassium 3.5 - 5.1 mmol/L 4.8 4.0 4.3  Chloride 98 - 111 mmol/L 104 103 102  CO2 22 - 32 mmol/L 25 21 26   Calcium 8.9 - 10.3 mg/dL 9.4 9.7 8.8(L)  Total Protein 6.5 - 8.1 g/dL 7.8 - 7.3  Total Bilirubin 0.3 - 1.2 mg/dL 0.8 - 0.9  Alkaline Phos 38 - 126 U/L 53 - 49  AST 15 - 41 U/L 18 - 17  ALT 0 - 44 U/L 22 - 16   Lipid Panel     Component Value Date/Time   CHOL 191 05/05/2017 0000   TRIG 106 05/05/2017 0000   HDL 53 05/05/2017 0000   LDLCALC 117 05/05/2017 0000   Lab Results  Component Value Date   HGBA1C 6.9 (A) 05/25/2019    Preferred Learning Style:     No preference indicated   Learning Readiness:  Ready  Change in progress   MEDICATIONS:   DIETARY INTAKE:  24-hr recall:  B ( 9 ): Eggs, canadian bacon,  Toast 1 slice white wheat, jellly, milk,  Snk ( AM):  L ( PM):  Fruit, water Snk ( PM): D ( PM): flounder, baked potatoes, slaw, hushpuppies, sweet tea Snk ( PM):  Beverages: water   Usual physical activity: [employer] 3 times  per week.  Estimated energy needs: 2200 calories 248 g carbohydrates 165 g protein 61 g fat  Progress Towards Goal(s):  In progress.   Nutritional Diagnosis:  NB-1.1 Food and nutrition-related knowledge deficit As related to DM and CKD.  As evidenced by A1C 6.9% and eGFR 36..    Intervention:  Nutrition and Diabetes education provided on My Plate, CHO counting, meal planning, portion sizes, timing of meals, avoiding snacks between meals unless having a low blood sugar, target ranges for A1C and blood sugars, signs/symptoms and treatment of hyper/hypoglycemia, monitoring blood sugars, taking medications as prescribed, benefits of exercising 30 minutes per day and prevention of complications of DM. Low salt low cholesterol diet   .Goals  Follow My Plate Don't skip lunch. Eat salad with protein and fruit at lunch Avoid sweet team. Don't eat past 7 pm Keep walking [employer] Follow Low Sodium Handout   Teaching Method Utilized:  Visual Auditory Hands on  Handouts given during visit include:  The Plate Method   Meal Plan Card    Barriers to learning/adherence to lifestyle change: none  Demonstrated degree of understanding via:  Teach Back   Monitoring/Evaluation:  Dietary intake, exercise, , and body weight in  3-4 month(s).  Consider increasing his Januvia to help lower FBS.

## 2019-08-24 ENCOUNTER — Other Ambulatory Visit: Payer: Self-pay | Admitting: Emergency Medicine

## 2019-08-24 DIAGNOSIS — I129 Hypertensive chronic kidney disease with stage 1 through stage 4 chronic kidney disease, or unspecified chronic kidney disease: Secondary | ICD-10-CM

## 2019-08-24 DIAGNOSIS — I152 Hypertension secondary to endocrine disorders: Secondary | ICD-10-CM

## 2019-08-24 DIAGNOSIS — E1159 Type 2 diabetes mellitus with other circulatory complications: Secondary | ICD-10-CM

## 2019-08-24 DIAGNOSIS — I10 Essential (primary) hypertension: Secondary | ICD-10-CM

## 2019-08-24 MED ORDER — METOPROLOL SUCCINATE ER 25 MG PO TB24: 25.0000 mg | ORAL_TABLET | Freq: Every day | ORAL | 3 refills | Status: DC

## 2019-08-24 NOTE — Telephone Encounter (Signed)
New rx has been sent to pharmacy metoprolol succinate (toprol-xl)25 mg

## 2019-09-29 DIAGNOSIS — R809 Proteinuria, unspecified: Secondary | ICD-10-CM | POA: Diagnosis not present

## 2019-09-29 DIAGNOSIS — E1122 Type 2 diabetes mellitus with diabetic chronic kidney disease: Secondary | ICD-10-CM | POA: Diagnosis not present

## 2019-09-29 DIAGNOSIS — N189 Chronic kidney disease, unspecified: Secondary | ICD-10-CM | POA: Diagnosis not present

## 2019-09-29 DIAGNOSIS — E1129 Type 2 diabetes mellitus with other diabetic kidney complication: Secondary | ICD-10-CM | POA: Diagnosis not present

## 2019-09-29 DIAGNOSIS — D631 Anemia in chronic kidney disease: Secondary | ICD-10-CM | POA: Diagnosis not present

## 2019-10-08 DIAGNOSIS — I129 Hypertensive chronic kidney disease with stage 1 through stage 4 chronic kidney disease, or unspecified chronic kidney disease: Secondary | ICD-10-CM | POA: Diagnosis not present

## 2019-10-08 DIAGNOSIS — N189 Chronic kidney disease, unspecified: Secondary | ICD-10-CM | POA: Diagnosis not present

## 2019-10-08 DIAGNOSIS — E1122 Type 2 diabetes mellitus with diabetic chronic kidney disease: Secondary | ICD-10-CM | POA: Diagnosis not present

## 2019-10-08 DIAGNOSIS — D631 Anemia in chronic kidney disease: Secondary | ICD-10-CM | POA: Diagnosis not present

## 2019-10-08 DIAGNOSIS — R809 Proteinuria, unspecified: Secondary | ICD-10-CM | POA: Diagnosis not present

## 2019-10-26 ENCOUNTER — Other Ambulatory Visit: Payer: Self-pay | Admitting: "Endocrinology

## 2019-11-16 ENCOUNTER — Encounter: Payer: Self-pay | Admitting: Family Medicine

## 2019-11-16 ENCOUNTER — Other Ambulatory Visit: Payer: Self-pay

## 2019-11-16 ENCOUNTER — Ambulatory Visit (INDEPENDENT_AMBULATORY_CARE_PROVIDER_SITE_OTHER): Payer: Medicare Other | Admitting: Family Medicine

## 2019-11-16 VITALS — BP 152/76 | HR 62 | Temp 97.6°F | Ht 72.0 in | Wt 181.2 lb

## 2019-11-16 DIAGNOSIS — E1121 Type 2 diabetes mellitus with diabetic nephropathy: Secondary | ICD-10-CM | POA: Diagnosis not present

## 2019-11-16 DIAGNOSIS — C61 Malignant neoplasm of prostate: Secondary | ICD-10-CM

## 2019-11-16 DIAGNOSIS — N183 Chronic kidney disease, stage 3 unspecified: Secondary | ICD-10-CM

## 2019-11-16 DIAGNOSIS — H9319 Tinnitus, unspecified ear: Secondary | ICD-10-CM | POA: Diagnosis not present

## 2019-11-16 DIAGNOSIS — H409 Unspecified glaucoma: Secondary | ICD-10-CM

## 2019-11-16 DIAGNOSIS — K5900 Constipation, unspecified: Secondary | ICD-10-CM | POA: Diagnosis not present

## 2019-11-16 DIAGNOSIS — I10 Essential (primary) hypertension: Secondary | ICD-10-CM | POA: Diagnosis not present

## 2019-11-16 DIAGNOSIS — E782 Mixed hyperlipidemia: Secondary | ICD-10-CM

## 2019-11-16 DIAGNOSIS — K219 Gastro-esophageal reflux disease without esophagitis: Secondary | ICD-10-CM

## 2019-11-16 MED ORDER — AMLODIPINE BESYLATE 10 MG PO TABS: 10.0000 mg | ORAL_TABLET | Freq: Every day | ORAL | 3 refills | Status: DC

## 2019-11-16 NOTE — Assessment & Plan Note (Signed)
Continue management per ophthalmology. 

## 2019-11-16 NOTE — Patient Instructions (Signed)
It was very nice to see you today!  Please start taking MiraLAX.  Please use as much as needed to have 1 bowel movement daily.  Please start with 1 scoop daily.  No other changes today.  I will see you back in a few months for your annual checkup with blood work.  Please come back to see me sooner if needed.  Take care, Dr Jerline Pain  Please try these tips to maintain a healthy lifestyle:   Eat at least 3 REAL meals and 1-2 snacks per day.  Aim for no more than 5 hours between eating.  If you eat breakfast, please do so within one hour of getting up.    Each meal should contain half fruits/vegetables, one quarter protein, and one quarter carbs (no bigger than a computer mouse)   Cut down on sweet beverages. This includes juice, soda, and sweet tea.     Drink at least 1 glass of water with each meal and aim for at least 8 glasses per day   Exercise at least 150 minutes every week.

## 2019-11-16 NOTE — Assessment & Plan Note (Signed)
No red flags.  Normal exam today.  Reassured patient.

## 2019-11-16 NOTE — Assessment & Plan Note (Signed)
Continue management per nephrology

## 2019-11-16 NOTE — Assessment & Plan Note (Signed)
Encouraged good oral hydration.  Recommended that he start MiraLAX as needed to have 1 soft bowel movement daily.

## 2019-11-16 NOTE — Assessment & Plan Note (Signed)
Needs lipid panel drawn soon.  Not sure how much benefit Crestor 5 mg daily is giving him at this point.  Depending on levels may be able to discontinue.

## 2019-11-16 NOTE — Assessment & Plan Note (Signed)
Continue management per endocrinology. 

## 2019-11-16 NOTE — Assessment & Plan Note (Signed)
Continue famotidine 20 mg twice daily

## 2019-11-16 NOTE — Progress Notes (Signed)
Richard Swanson is a 85 y.o. male who presents today for an office visit.  Assessment/Plan:  Chronic Problems Addressed Today: Tinnitus No red flags.  Normal exam today.  Reassured patient.  Constipation Encouraged good oral hydration.  Recommended that he start MiraLAX as needed to have 1 soft bowel movement daily.  Essential hypertension, benign Above goal though has been off amlodipine 54m daily.  Will restart today.  Continue lisinopril 20 mg daily and metoprolol succinate 25 mg daily.  Follow-up in a few months for CPE.  Would likely try to consolidate medications at increased dose of lisinopril and decrease dose or stop metoprolol.  Type 2 diabetes mellitus with diabetic nephropathy, without long-term current use of insulin ([place] Continue management per endocrinology.  Prostate cancer ([facility] Continue management per urology.  Stage 3 chronic kidney disease Continue management per nephrology.  Glaucoma Continue management per ophthalmology.   GERD (gastroesophageal reflux disease) Continue famotidine 20 mg twice daily.  Mixed hyperlipidemia Needs lipid panel drawn soon.  Not sure how much benefit Crestor 5 mg daily is giving him at this point.  Depending on levels may be able to discontinue.     Subjective:  HPI:  Patient is here to establish care.  He has a few small concerns today.  He has had left sided tinnitus for the past year or so.  Comes and goes.  Has seen a few doctors for this.  No hearing loss.  Symptoms are overall stable.  He is also had ongoing issues with constipation for the past several months.  Has had no bowel movement every 2 to 3 days.  Is having to strain hard to push bowel movement out.  Not tried anything for this.  His stable, chronic medical conditions are outlined below:  # Essential Hypertension - On amlodipine 177mdaily, lisinopril 2017maily, and metoprolol succinzte 65m45mily  # T2DM  - Follows with endocrinology - On  januvia 50mg54mly and glipizide 5mg d75my with breakfast  # Dyslipidemia - On crestor 5 mg daily and tolerating well  # CKD  - Follows with nephrology  # GERD - On famotidine 20mg t93m daily  # Glaucoma  # Prostate Cancer s/p surgery and radiation - Follows with urologist  ROS: Per HPI, otherwise a complete review of systems was negative.   PMH:  The following were reviewed and entered/updated in epic: Past Medical History:  Diagnosis Date  . [facility]  . Chronic obstructive pulmonary disease, unspecified (HCC)   [place]   [place]   . Gastro-esophageal reflux disease without esophagitis   . Glaucoma   . Glomerular disorders in diseases classified elsewhere   . Gross hematuria   . Hypertension   . Hypertensive chronic kidney disease with stage 1 through stage 4 chronic kidney disease, or unspecified chronic kidney disease   . Kidney disease, chronic, stage III (moderate, EGFR 30-59 ml/min)   . Malignant neoplasm of prostate (HCC)   [place] ([place] 7/[place]   dx 2006  . Type 2 diabetes mellitus with diabetic nephropathy (HCC)   [place]    Patient Active Problem List   Diagnosis Date Noted  . Constipation 11/16/2019  . Tinnitus 11/16/2019  . Essential hypertension, benign 08/23/2019  . Mixed hyperlipidemia 08/23/2019  . Allergic rhinitis due to pollen 08/19/2019  . Hypertension associated with diabetes (HCC) 01[place]/2021  . GERD (gastroesophageal reflux disease)   . Glaucoma   .  Stage 3 chronic kidney disease   . Glomerular disorders in diseases classified elsewhere   . Type 2 diabetes mellitus with diabetic nephropathy, without long-term current use of insulin ([place])   . Anemia in chronic kidney disease 07/31/2015  . Prostate cancer ([place]) 11/03/2010   Past Surgical History:  Procedure Laterality Date  . PROSTATE SURGERY      Family History  Problem Relation Age of Onset    . Stroke Father     Medications- reviewed and updated Current Outpatient Medications  Medication Sig Dispense Refill  . amLODipine (NORVASC) 10 MG tablet Take 1 tablet (10 mg total) by mouth daily. 90 tablet 3  . famotidine (PEPCID) 20 MG tablet Take 1 tablet (20 mg total) by mouth 2 (two) times daily. 180 tablet 3  . glipiZIDE (GLUCOTROL XL) 5 MG 24 hr tablet Take 1 tablet (5 mg total) by mouth daily with breakfast. 90 tablet 1  . JANUVIA 50 MG tablet TAKE ONE TABLET BY MOUTH ONCE DAILY. 90 tablet 0  . lisinopril (ZESTRIL) 20 MG tablet TAKE ONE TABLET BY MOUTH ONCE DAILY. 90 tablet 0  . metoprolol succinate (TOPROL-XL) 25 MG 24 hr tablet Take 1 tablet (25 mg total) by mouth daily. 90 tablet 3  . rosuvastatin (CRESTOR) 5 MG tablet TAKE ONE TABLET BY MOUTH ONCE DAILY. 90 tablet 0  . VYZULTA 0.024 % SOLN Apply 1 drop to eye at bedtime.     No current facility-administered medications for this visit.    Allergies-reviewed and updated No Known Allergies  Social History   Socioeconomic History  . Marital status: Married    Spouse name: Not on file  . Number of children: Not on file  . Years of education: Not on file  . Highest education level: Not on file  Occupational History  . Not on file  Tobacco Use  . Smoking status: Never Smoker  . Smokeless tobacco: Never Used  Vaping Use  . Vaping Use: Never used  Substance and Sexual Activity  . Alcohol use: No  . Drug use: No  . Sexual activity: Not on file  Other Topics Concern  . Not on file  Social History Narrative  . Not on file   Social Determinants of Health   Financial Resource Strain:   . Difficulty of Paying Living Expenses:   Food Insecurity:   . Worried About Charity fundraiser in the Last Year:   . Arboriculturist in the Last Year:   Transportation Needs:   . Film/video editor (Medical):   Marland Kitchen Lack of Transportation (Non-Medical):   Physical Activity:   . Days of Exercise per Week:   . Minutes of  Exercise per Session:   Stress:   . Feeling of Stress :   Social Connections:   . Frequency of Communication with Friends and Family:   . Frequency of Social Gatherings with Friends and Family:   . Attends Religious Services:   . Active Member of Clubs or Organizations:   . Attends Archivist Meetings:   Marland Kitchen Marital Status:           Objective:  Physical Exam: BP (!) 152/76   Pulse 62   Temp 97.6 F (36.4 C)   Ht 6' (1.829 m)   Wt 181 lb 3.2 oz (82.2 kg)   SpO2 97%   BMI 24.58 kg/m   Gen: No acute distress, resting comfortably HEENT: TMs clear bilaterally. CV: Regular rate and rhythm with no  murmurs appreciated Pulm: Normal work of breathing, clear to auscultation bilaterally with no crackles, wheezes, or rhonchi Neuro: Grossly normal, moves all extremities Psych: Normal affect and thought content  Time Spent: 66 minutes of total time was spent on the date of the encounter performing the following actions: chart review prior to seeing the patient including recent visits with previous PCP, obtaining history, performing a medically necessary exam, counseling on the treatment plan, placing orders, and documenting in our EHR.        Algis Greenhouse. Jerline Pain, MD 11/16/2019 2:26 PM

## 2019-11-16 NOTE — Assessment & Plan Note (Signed)
Continue management per urology. 

## 2019-11-16 NOTE — Addendum Note (Signed)
Addended by: Vivi Barrack on: 11/16/2019 04:09 PM   Modules accepted: Level of Service

## 2019-11-16 NOTE — Assessment & Plan Note (Signed)
Above goal though has been off amlodipine 10mg  daily.  Will restart today.  Continue lisinopril 20 mg daily and metoprolol succinate 25 mg daily.  Follow-up in a few months for CPE.  Would likely try to consolidate medications at increased dose of lisinopril and decrease dose or stop metoprolol.

## 2019-11-18 ENCOUNTER — Ambulatory Visit: Payer: Medicare Other | Admitting: Family Medicine

## 2019-11-23 ENCOUNTER — Ambulatory Visit: Payer: Medicare Other | Admitting: "Endocrinology

## 2019-11-23 ENCOUNTER — Ambulatory Visit: Payer: Medicare Other | Admitting: Nutrition

## 2019-11-27 ENCOUNTER — Other Ambulatory Visit: Payer: Self-pay | Admitting: "Endocrinology

## 2019-11-28 IMAGING — US US RENAL
1 series · 13 of 25 positions shown · non-contrast
Comparison: Abdominal CT 07/01/2011

CLINICAL DATA: Chronic kidney disease, stage III.

EXAM:
RENAL / URINARY TRACT ULTRASOUND COMPLETE

[Series 1: us renal · 0.23mm/px · 13 of 57 slices shown]
[im 1/57]
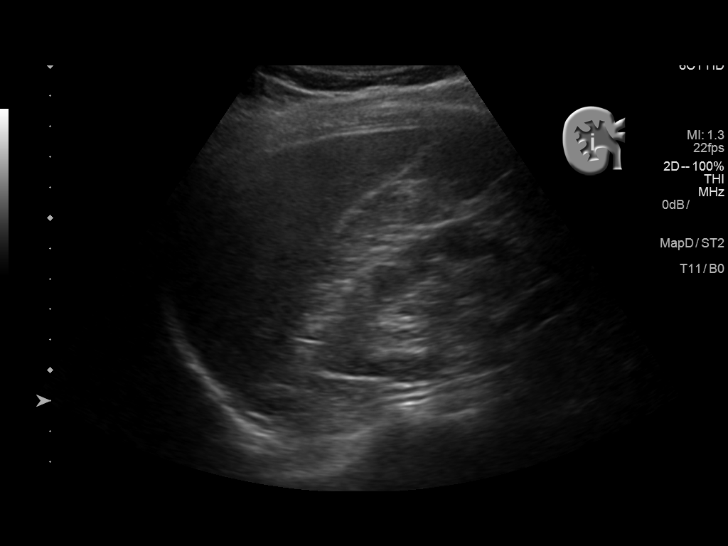
[im 5/57]
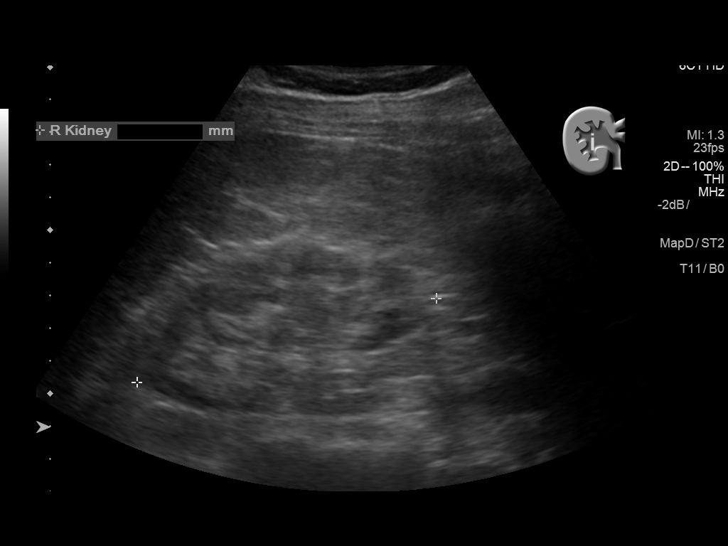
[im 10/57]
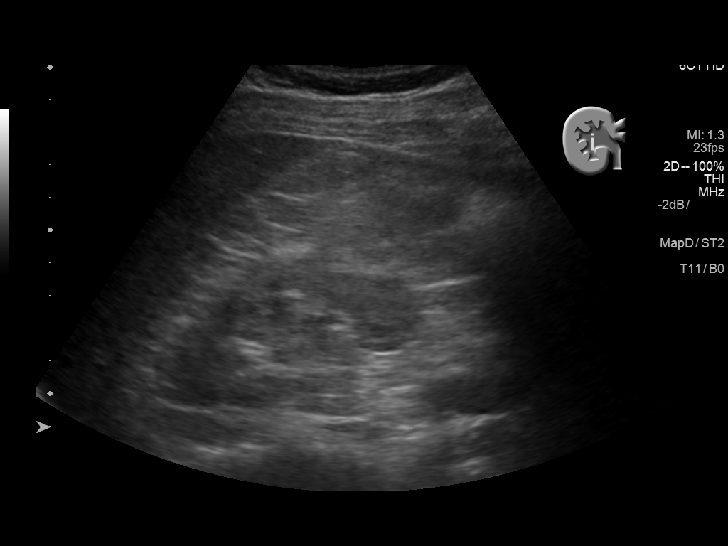
[im 15/57]
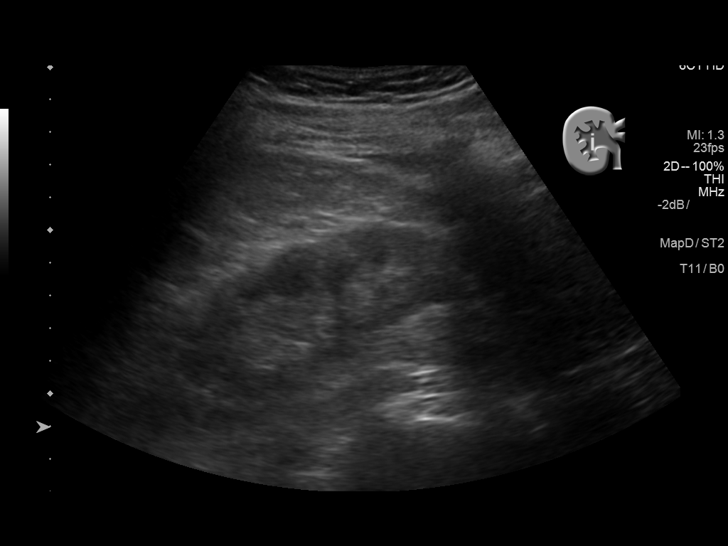
[im 19/57]
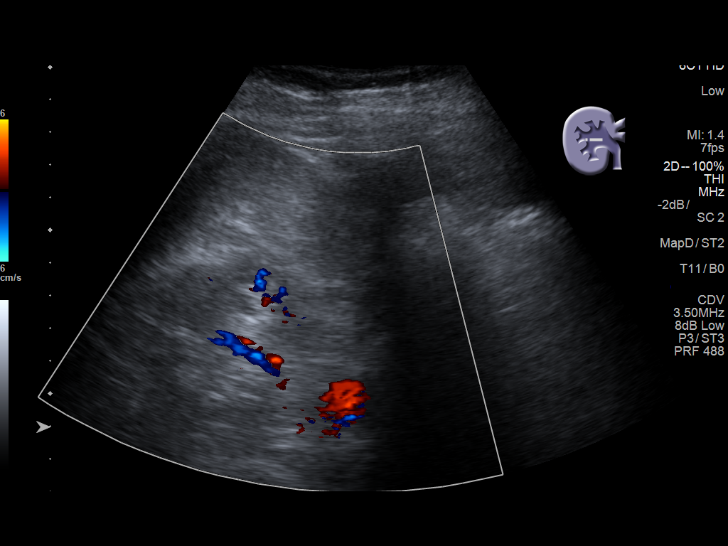
[im 24/57]
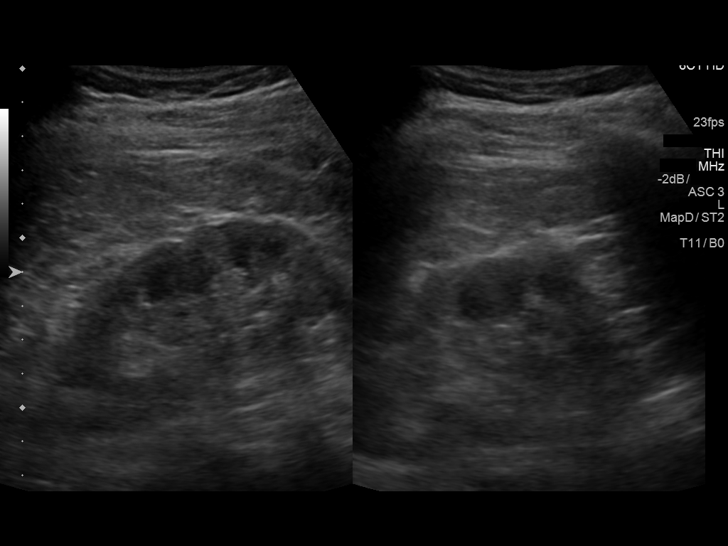
[im 29/57]
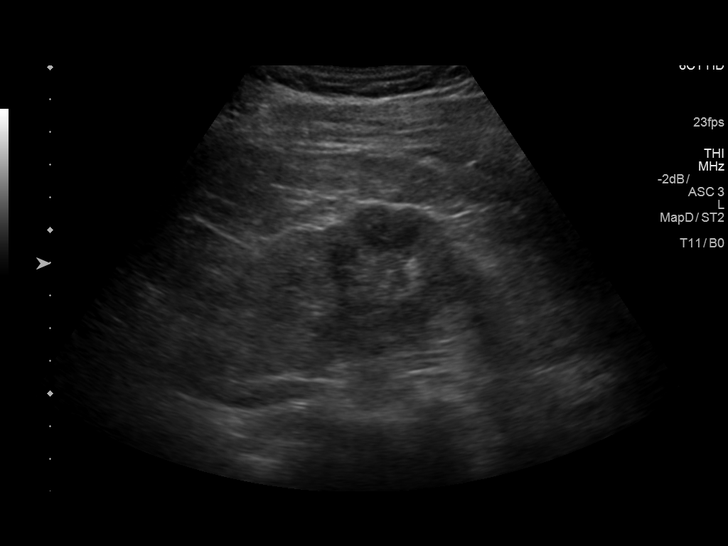
[im 33/57]
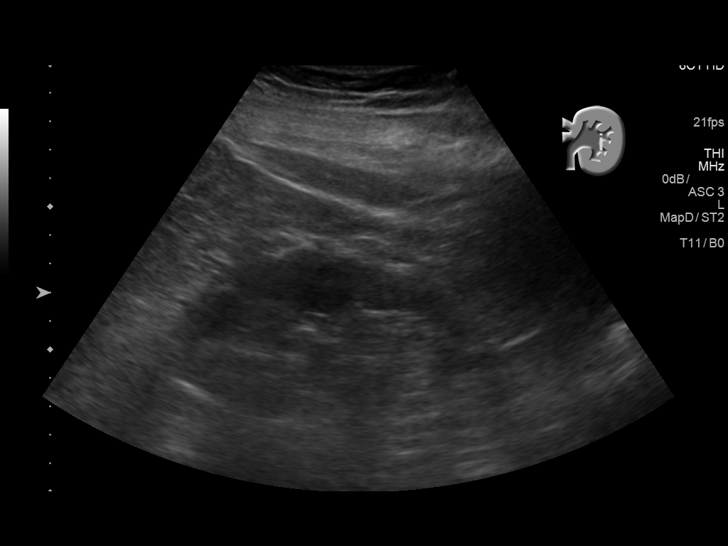
[im 38/57]
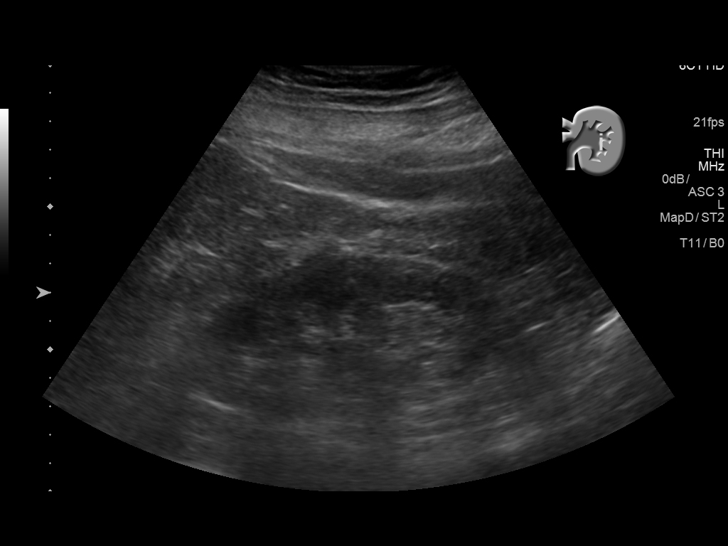
[im 43/57]
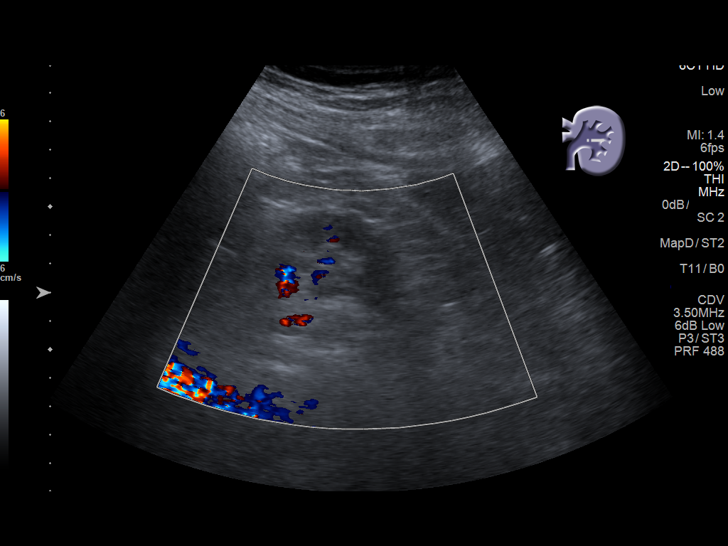
[im 47/57]
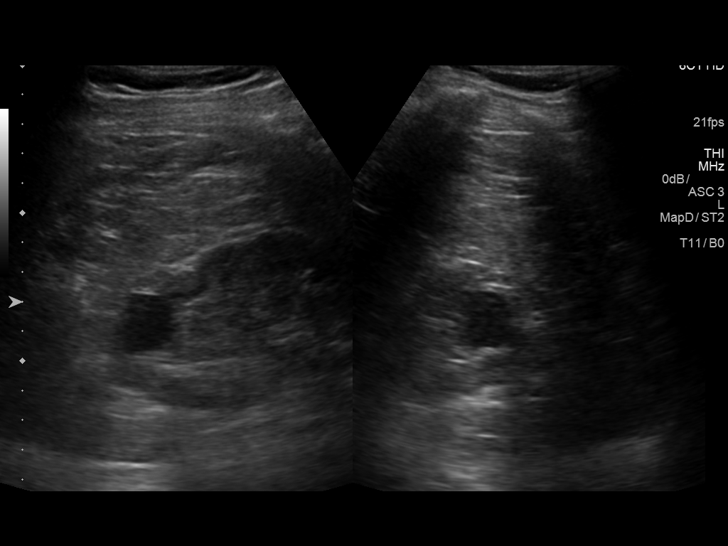
[im 52/57]
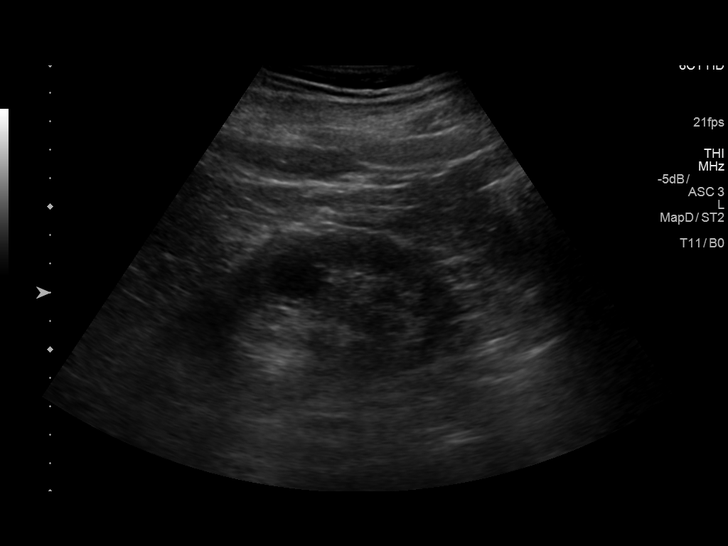
[im 57/57]
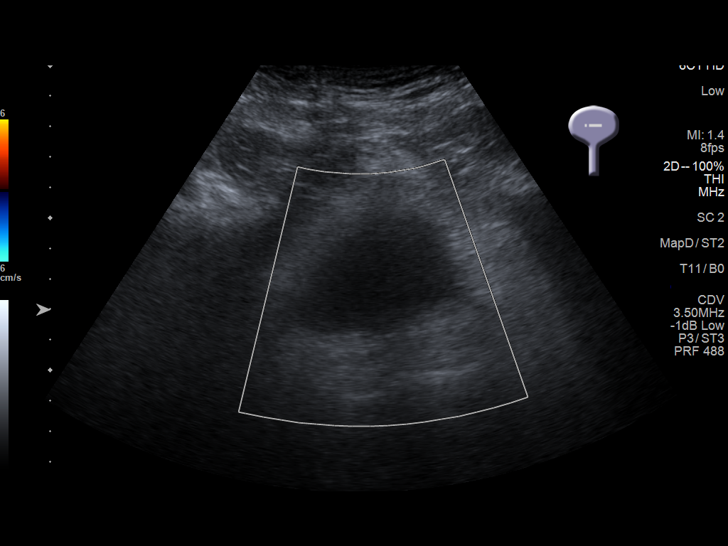

[13 of 25 positions shown; findings below may reference images not displayed]

FINDINGS: Right Kidney:

Length: 9.3 cm. Cortical thinning in the right kidney with slightly
increased echogenicity. Negative for right hydronephrosis.
Indeterminate hypoechoic structure in the mid right kidney that
measures 2.2 x 1.5 x 1.9 cm. This is not compatible with a simple
cyst. Small echogenic structure in the lower pole measures 0.4 cm
and question a small stone.

Left Kidney:

Length: 10.2 cm. Cortical thinning in the left kidney without
hydronephrosis. Slightly increased echogenicity in the left kidney.
There is a hypoechoic structure along the left kidney upper pole
with acoustic enhancement. This structure measures up to 2.0 cm.
There was a hypodense structure in this area on the previous CT and
findings are most compatible with a left renal cyst. Evidence for a
second cyst in the left kidney midpole measuring up to 2.1 cm.

Bladder:

Appears normal for degree of bladder distention.
IMPRESSION: Cortical thinning and increased echogenicity in both kidneys.
Findings are most compatible with chronic medical renal disease.

Indeterminate hypoechoic structure in the right kidney measuring up
to 2.2 cm. This is not compatible with a simple cyst based on
ultrasound and cannot exclude a solid renal lesion. Recommend
further characterization with renal MRI.

Left renal cysts.

Question a right renal calculus.

These results will be called to the ordering clinician or
representative by the Radiologist Assistant, and communication
documented in the PACS or zVision Dashboard.

## 2019-11-30 ENCOUNTER — Ambulatory Visit (INDEPENDENT_AMBULATORY_CARE_PROVIDER_SITE_OTHER): Payer: Medicare Other | Admitting: Nurse Practitioner

## 2019-11-30 ENCOUNTER — Encounter: Payer: Self-pay | Admitting: Nutrition

## 2019-11-30 ENCOUNTER — Other Ambulatory Visit: Payer: Self-pay

## 2019-11-30 ENCOUNTER — Encounter: Payer: Medicare Other | Attending: "Endocrinology | Admitting: Nutrition

## 2019-11-30 ENCOUNTER — Encounter: Payer: Self-pay | Admitting: Nurse Practitioner

## 2019-11-30 VITALS — Ht 72.0 in | Wt 183.0 lb

## 2019-11-30 VITALS — BP 126/68 | HR 47 | Ht 72.0 in | Wt 183.6 lb

## 2019-11-30 DIAGNOSIS — E1159 Type 2 diabetes mellitus with other circulatory complications: Secondary | ICD-10-CM | POA: Diagnosis not present

## 2019-11-30 DIAGNOSIS — I152 Hypertension secondary to endocrine disorders: Secondary | ICD-10-CM

## 2019-11-30 DIAGNOSIS — I10 Essential (primary) hypertension: Secondary | ICD-10-CM | POA: Insufficient documentation

## 2019-11-30 DIAGNOSIS — E782 Mixed hyperlipidemia: Secondary | ICD-10-CM | POA: Diagnosis not present

## 2019-11-30 DIAGNOSIS — E1121 Type 2 diabetes mellitus with diabetic nephropathy: Secondary | ICD-10-CM

## 2019-11-30 LAB — POCT GLYCOSYLATED HEMOGLOBIN (HGB A1C): Hemoglobin A1C: 6.8 % — AB (ref 4.0–5.6)

## 2019-11-30 NOTE — Patient Instructions (Signed)
  Goal  Keep up the good work!!

## 2019-11-30 NOTE — Progress Notes (Signed)
11/30/2019, 10:21 AM   Endocrinology follow-up note  Subjective:    Patient ID: Richard Swanson, male    DOB: [birth year].  Richard Swanson is being seen in follow-up after he was seen in consultation for management of currently uncontrolled symptomatic diabetes requested by  Vivi Barrack, MD.   Past Medical History:  Diagnosis Date  . Cancer [facility])    Prostate  . Chronic obstructive pulmonary disease, unspecified ([place])   . Diabetes mellitus without complication ([place])   . Dizziness   . Gastro-esophageal reflux disease without esophagitis   . Glaucoma   . Glomerular disorders in diseases classified elsewhere   . Gross hematuria   . Hypertension   . Hypertensive chronic kidney disease with stage 1 through stage 4 chronic kidney disease, or unspecified chronic kidney disease   . Kidney disease, chronic, stage III (moderate, EGFR 30-59 ml/min)   . Malignant neoplasm of prostate ([employer])   . Prostate cancer ([place]) 11/03/2010   dx 2006  . Type 2 diabetes mellitus with diabetic nephropathy ([place])   . Unspecified kidney failure     Past Surgical History:  Procedure Laterality Date  . PROSTATE SURGERY      Social History   Socioeconomic History  . Marital status: Married    Spouse name: Not on file  . Number of children: Not on file  . Years of education: Not on file  . Highest education level: Not on file  Occupational History  . Not on file  Tobacco Use  . Smoking status: Never Smoker  . Smokeless tobacco: Never Used  Vaping Use  . Vaping Use: Never used  Substance and Sexual Activity  . Alcohol use: No  . Drug use: No  . Sexual activity: Not on file  Other Topics Concern  . Not on file  Social History Narrative  . Not on file   Social Determinants of Health   Financial Resource Strain:   . Difficulty of Paying Living Expenses:   Food Insecurity:   . Worried About Charity fundraiser in  the Last Year:   . Arboriculturist in the Last Year:   Transportation Needs:   . Film/video editor (Medical):   Marland Kitchen Lack of Transportation (Non-Medical):   Physical Activity:   . Days of Exercise per Week:   . Minutes of Exercise per Session:   Stress:   . Feeling of Stress :   Social Connections:   . Frequency of Communication with Friends and Family:   . Frequency of Social Gatherings with Friends and Family:   . Attends Religious Services:   . Active Member of Clubs or Organizations:   . Attends Archivist Meetings:   Marland Kitchen Marital Status:     Family History  Problem Relation Age of Onset  . Stroke Father     Outpatient Encounter Medications as of 11/30/2019  Medication Sig  . amLODipine (NORVASC) 10 MG tablet Take 1 tablet (10 mg total) by mouth daily.  . famotidine (PEPCID) 20 MG tablet Take 1 tablet (20 mg total) by mouth 2 (two) times daily.  Marland Kitchen GLIPIZIDE XL 5 MG 24 hr tablet TAKE ONE  TABLET BY MOUTH ONCE DAILY WITH BREAKFAST.  Marland Kitchen JANUVIA 50 MG tablet TAKE ONE TABLET BY MOUTH ONCE DAILY.  Marland Kitchen lisinopril (ZESTRIL) 20 MG tablet TAKE ONE TABLET BY MOUTH ONCE DAILY.  . metoprolol succinate (TOPROL-XL) 25 MG 24 hr tablet Take 1 tablet (25 mg total) by mouth daily.  . rosuvastatin (CRESTOR) 5 MG tablet TAKE ONE TABLET BY MOUTH ONCE DAILY.  Marland Kitchen VYZULTA 0.024 % SOLN Apply 1 drop to eye at bedtime.   No facility-administered encounter medications on file as of 11/30/2019.    ALLERGIES: No Known Allergies  VACCINATION STATUS: Immunization History  Administered Date(s) Administered  . Influenza, High Dose Seasonal PF 03/23/2018  . Influenza,inj,Quad PF,6+ Mos 01/30/2016, 01/29/2017, 01/28/2019  . Moderna SARS-COVID-2 Vaccination 06/05/2019, 07/06/2019  . Pneumococcal Polysaccharide-23 01/28/2019    Diabetes He presents for his follow-up diabetic visit. He has type 2 diabetes mellitus. Onset time: He was diagnosed at approximate age of 16 years. His disease course has  been improving. Pertinent negatives for hypoglycemia include no confusion, headaches, pallor or seizures. There are no diabetic associated symptoms. Pertinent negatives for diabetes include no chest pain, no fatigue, no polydipsia, no polyphagia, no polyuria and no weakness. There are no hypoglycemic complications. Symptoms are improving. Diabetic complications include nephropathy. Risk factors for coronary artery disease include dyslipidemia, diabetes mellitus, hypertension, male sex and sedentary lifestyle. When asked about current treatments, none (He was treated with Janumet for several years until he was stopped recently due to CKD.  He has not taken any medications for diabetes in the last several weeks.) were reported. He is compliant with treatment most of the time. His weight is fluctuating minimally. He is following a generally unhealthy diet. When asked about meal planning, he reported none. He has not had a previous visit with a dietitian. He rarely participates in exercise. His home blood glucose trend is fluctuating minimally. His breakfast blood glucose range is generally 130-140 mg/dl. His bedtime blood glucose range is generally 140-180 mg/dl. (He presents today with his logs showing near target fasting glycemic profile.  His A1c today is 6.8% improving from 7.3% on 08/23/2019.  There are no reported or documented hypoglycemic episodes.) An ACE inhibitor/angiotensin II receptor blocker is being taken. Eye exam is current.  Hyperlipidemia This is a chronic problem. The current episode started more than 1 year ago. The problem is uncontrolled. Exacerbating diseases include chronic renal disease and diabetes. Pertinent negatives include no chest pain, myalgias or shortness of breath. Current antihyperlipidemic treatment includes statins. There are no compliance problems.  Risk factors for coronary artery disease include dyslipidemia, diabetes mellitus, hypertension, male sex and a sedentary lifestyle.   Hypertension This is a chronic problem. The current episode started more than 1 year ago. The problem has been gradually improving since onset. The problem is controlled. Pertinent negatives include no chest pain, headaches, neck pain, palpitations or shortness of breath. Risk factors for coronary artery disease include diabetes mellitus, dyslipidemia, male gender and sedentary lifestyle. Past treatments include ACE inhibitors, beta blockers and calcium channel blockers. The current treatment provides significant improvement. There are no compliance problems.  Hypertensive end-organ damage includes kidney disease. Identifiable causes of hypertension include chronic renal disease.     Review of Systems  Constitutional: Negative for chills, fatigue, fever and unexpected weight change.  HENT: Negative for dental problem, mouth sores and trouble swallowing.   Eyes: Negative for visual disturbance.  Respiratory: Negative for cough, choking, chest tightness, shortness of breath and wheezing.  Cardiovascular: Negative for chest pain, palpitations and leg swelling.  Gastrointestinal: Negative for abdominal distention, abdominal pain, constipation, diarrhea, nausea and vomiting.  Endocrine: Negative for polydipsia, polyphagia and polyuria.  Genitourinary: Negative for dysuria, flank pain, hematuria and urgency.  Musculoskeletal: Negative for back pain, gait problem, myalgias and neck pain.  Skin: Negative for pallor, rash and wound.  Neurological: Negative for seizures, syncope, weakness, numbness and headaches.  Psychiatric/Behavioral: Negative for confusion and dysphoric mood.    Objective:    Vitals with BMI 11/30/2019 11/30/2019 11/16/2019  Height 6' 0"  6' 0"  6' 0"   Weight 183 lbs 183 lbs 10 oz 181 lbs 3 oz  BMI 24.81 18.8 41.66  Systolic - 063 016  Diastolic - 68 76  Pulse - 47 62    BP 126/68 (BP Location: Right Arm, Patient Position: Sitting)   Pulse (!) 47   Ht 6' (1.829 m)   Wt 183 lb  9.6 oz (83.3 kg)   BMI 24.90 kg/m   Wt Readings from Last 3 Encounters:  11/30/19 183 lb 9.6 oz (83.3 kg)  11/16/19 181 lb 3.2 oz (82.2 kg)  08/23/19 183 lb (83 kg)      Physical Exam- Limited  Constitutional:  Body mass index is 24.9 kg/m. , not in acute distress, normal state of mind Eyes:  EOMI, no exophthalmos Neck: Supple Thyroid: No gross goiter Cardiovascular: RRR, no murmers, rubs, or gallops, no edema Respiratory: Adequate breathing efforts, no crackles, rales, rhonchi, or wheezing Musculoskeletal: no gross deformities, strength intact in all four extremities, no gross restriction of joint movements Skin:  no rashes, no hyperemia Neurological: no tremor with outstretched hands  CMP     Component Value Date/Time   NA 138 07/29/2019 1115   NA 140 05/14/2019 0000   K 4.8 07/29/2019 1115   CL 104 07/29/2019 1115   CO2 25 07/29/2019 1115   GLUCOSE 168 (H) 07/29/2019 1115   BUN 38 (H) 07/29/2019 1115   BUN 26 (A) 05/14/2019 0000   CREATININE 2.31 (H) 07/29/2019 1115   CALCIUM 9.4 07/29/2019 1115   PROT 7.8 07/29/2019 1115   ALBUMIN 4.4 07/29/2019 1115   AST 18 07/29/2019 1115   ALT 22 07/29/2019 1115   ALKPHOS 53 07/29/2019 1115   BILITOT 0.8 07/29/2019 1115   GFRNONAA 25 (L) 07/29/2019 1115   GFRAA 29 (L) 07/29/2019 1115    Diabetic Labs (most recent): Lab Results  Component Value Date   HGBA1C 6.8 (A) 11/30/2019   HGBA1C 7.3 (A) 08/23/2019   HGBA1C 6.9 (A) 05/25/2019     Lipid Panel ( most recent) Lipid Panel     Component Value Date/Time   CHOL 191 05/05/2017 0000   TRIG 106 05/05/2017 0000   HDL 53 05/05/2017 0000   LDLCALC 117 05/05/2017 0000      Lab Results  Component Value Date   TSH 1.48 05/05/2017      Assessment & Plan:   1. Type 2 diabetes mellitus with diabetic nephropathy, without long-term current use of insulin ([place])  - Richard Swanson has currently uncontrolled symptomatic type 2 DM since 85 years of age.  He presents  today with his logs showing near target fasting glycemic profile.  His A1c today is 6.8% improving from 7.3% on 08/23/2019.  There are no reported or documented hypoglycemic episodes.  - Recent labs reviewed.  - I had a long discussion with him about the progressive nature of diabetes and the pathology behind its complications. -his diabetes is complicated  by CKD and he remains at a high risk for more acute and chronic complications which include CAD, CVA, CKD, retinopathy, and neuropathy. These are all discussed in detail with him.  - The patient admits there is a room for improvement in their diet and drink choices. -  Suggestion is made for the patient to avoid simple carbohydrates from their diet including Cakes, Sweet Desserts / Pastries, Ice Cream, Soda (diet and regular), Sweet Tea, Candies, Chips, Cookies, Sweet Pastries,  Store Bought Juices, Alcohol in Excess of  1-2 drinks a day, Artificial Sweeteners, Coffee Creamer, and "Sugar-free" Products. This will help patient to have stable blood glucose profile and potentially avoid unintended weight gain.   - I encouraged the patient to switch to  unprocessed or minimally processed complex starch and increased protein intake (animal or plant source), fruits, and vegetables.   - Patient is advised to stick to a routine mealtimes to eat 3 meals  a day and avoid unnecessary snacks ( to snack only to correct hypoglycemia).  - he is scheduled with Jearld Fenton, RDN, CDE for diabetes education after his visit with me today.  - I have approached him with the following individualized plan to manage  his diabetes and patient agrees:   -Based on his controlled A1c of 6.8%, he will not need insulin treatment at this time.  He is advised to continue his glipizide 5 mg XL p.o. daily with breakfast, and Januvia 50 mg p.o. daily.  He is encouraged to continue monitoring blood glucose at least 1 time a daily, before breakfast.  - he is encouraged to call  clinic for blood glucose levels less than 70 or above 200 mg /dl for 3 consecutive occurrences.  - he is not a candidate for metformin, SGLT2 inhibitors due to concurrent renal insufficiency.   - Specific targets for  A1c;  LDL, HDL,  and Triglycerides were discussed with the patient.  2) Blood Pressure /Hypertension:  His blood pressure is controlled to target.  He is advised to continue lisinopril 20 mg p.o. daily, metoprolol 25 mg p.o. daily, and amlodipine 10 mg p.o. daily.  3) Lipids/Hyperlipidemia:   Lipid panel shows uncontrolled LDL at 117 on 05/05/2017.  He is advised to continue Crestor 5 mg p.o. nightly.  Side effects and precautions discussed with him.  Will consider rechecking lipids on subsequent visit.   4)  Weight/Diet:   Body mass index is 24.9 kg/m.  -     he is not a candidate for weight loss.  Exercise, and detailed carbohydrates information provided  -  detailed on discharge instructions.  5) Chronic Care/Health Maintenance:  -he  is on ACEI/ARB and Statin medications and  is encouraged to initiate and continue to follow up with Ophthalmology, Dentist,  Podiatrist at least yearly or according to recommendations, and advised to   stay away from smoking. I have recommended yearly flu vaccine and pneumonia vaccine at least every 5 years; moderate intensity exercise for up to 150 minutes weekly; and  sleep for at least 7 hours a day.  - he is  advised to maintain close follow up with Vivi Barrack, MD for primary care needs, as well as his other providers for optimal and coordinated care.   - Time spent on this patient care encounter:  35 min, of which > 50% was spent in  counseling and the rest reviewing his blood glucose logs , discussing his hypoglycemia and hyperglycemia episodes, reviewing his current and  previous  labs / studies  ( including abstraction from other facilities) and medications  doses and developing a  long term treatment plan and documenting his care.    Please refer to Patient Instructions for Blood Glucose Monitoring and Insulin/Medications Dosing Guide"  in media tab for additional information. Please  also refer to " Patient Self Inventory" in the Media  tab for reviewed elements of pertinent patient history.  Richard Swanson participated in the discussions, expressed understanding, and voiced agreement with the above plans.  All questions were answered to his satisfaction. he is encouraged to contact clinic should he have any questions or concerns prior to his return visit.   Follow up plan: - Return in about 4 months (around 03/31/2020) for Diabetes follow up- A1c and urine micro in office, Bring glucometer and logs, No previsit labs.  Rayetta Pigg, FNP-BC [facility] Phone: [phone] Fax: [phone]   11/30/2019, 10:21 AM  This note was partially dictated with voice recognition software. Similar sounding words can be transcribed inadequately or may not  be corrected upon review.

## 2019-11-30 NOTE — Patient Instructions (Signed)

## 2019-11-30 NOTE — Progress Notes (Signed)
  Medical Nutrition Therapy:  Appt start time: 8937 end time: 1030  Assessment:  Primary concerns today: Diabetes Typd 2 x -10 yrs.  Sees Whitney Reardon,NP.Marland Kitchen Should get A1C rechecked today. A1C down to 6.8% now from 7.3%.  CKD Stg 4. Creatine 2.31 mg/dl. Sees a nephrologist. Cut out salt and eating more baked and broiled foods and less processed food. Started back to the [facility] for 3 x a week. Goes an hour at a time.  He has stopped eating fried foods, and eating a lot more lower carb vegetables. Cut out sodas. Working out at the [employer] times per week. Still on Januvia and Glipizide .   CMP Latest Ref Rng & Units 07/29/2019 05/14/2019 01/29/2019  Glucose 70 - 99 mg/dL 168(H) - 183(H)  BUN 8 - 23 mg/dL 38(H) 26(A) 23  Creatinine 0.61 - 1.24 mg/dL 2.31(H) 2.4(A) 1.98(H)  Sodium 135 - 145 mmol/L 138 140 136  Potassium 3.5 - 5.1 mmol/L 4.8 4.0 4.3  Chloride 98 - 111 mmol/L 104 103 102  CO2 22 - 32 mmol/L '25 21 26  '$ Calcium 8.9 - 10.3 mg/dL 9.4 9.7 8.8(L)  Total Protein 6.5 - 8.1 g/dL 7.8 - 7.3  Total Bilirubin 0.3 - 1.2 mg/dL 0.8 - 0.9  Alkaline Phos 38 - 126 U/L 53 - 49  AST 15 - 41 U/L 18 - 17  ALT 0 - 44 U/L 22 - 16   Lipid Panel     Component Value Date/Time   CHOL 191 05/05/2017 0000   TRIG 106 05/05/2017 0000   HDL 53 05/05/2017 0000   LDLCALC 117 05/05/2017 0000   Lab Results  Component Value Date   HGBA1C 6.8 (A) 11/30/2019    Preferred Learning Style:     No preference indicated   Learning Readiness:  Ready  Change in progress   MEDICATIONS:   DIETARY INTAKE:  24-hr recall:  B ( 9 ): Eggs, canadian bacon,  Toast 1 slice white wheat, jellly, milk,  Snk ( AM):  L ( PM): [place] sandwich on wheat bread, water, peaches Snk ( PM): D ( PM)  String beans, corn, hamburger and water, Snk ( PM):  Beverages: water   Usual physical activity: [employer] 3 times per week.  Estimated energy needs: 2200 calories 248 g carbohydrates 165 g protein 61 g fat  Progress  Towards Goal(s):  In progress.   Nutritional Diagnosis:  NB-1.1 Food and nutrition-related knowledge deficit As related to DM and CKD.  As evidenced by A1C 6.9% and eGFR 36..    Intervention:  Nutrition and Diabetes education provided on My Plate, CHO counting, meal planning, portion sizes, timing of meals, avoiding snacks between meals unless having a low blood sugar, target ranges for A1C and blood sugars, signs/symptoms and treatment of hyper/hypoglycemia, monitoring blood sugars, taking medications as prescribed, benefits of exercising 30 minutes per day and prevention of complications of DM. Low salt low cholesterol diet   .Goals  Keep up the great job!!   Teaching Method Utilized:  Visual Auditory Hands on  Handouts given during visit include:  The Plate Method   Meal Plan Card    Barriers to learning/adherence to lifestyle change: none  Demonstrated degree of understanding via:  Teach Back   Monitoring/Evaluation:  Dietary intake, exercise, , and body weight in  3-4 month(s).

## 2019-12-08 DIAGNOSIS — H35033 Hypertensive retinopathy, bilateral: Secondary | ICD-10-CM | POA: Diagnosis not present

## 2019-12-08 DIAGNOSIS — H26493 Other secondary cataract, bilateral: Secondary | ICD-10-CM | POA: Diagnosis not present

## 2019-12-08 DIAGNOSIS — H401132 Primary open-angle glaucoma, bilateral, moderate stage: Secondary | ICD-10-CM | POA: Diagnosis not present

## 2019-12-08 DIAGNOSIS — Z961 Presence of intraocular lens: Secondary | ICD-10-CM | POA: Diagnosis not present

## 2019-12-09 DIAGNOSIS — R809 Proteinuria, unspecified: Secondary | ICD-10-CM | POA: Diagnosis not present

## 2019-12-09 DIAGNOSIS — E1129 Type 2 diabetes mellitus with other diabetic kidney complication: Secondary | ICD-10-CM | POA: Diagnosis not present

## 2019-12-09 DIAGNOSIS — E1122 Type 2 diabetes mellitus with diabetic chronic kidney disease: Secondary | ICD-10-CM | POA: Diagnosis not present

## 2019-12-09 DIAGNOSIS — D631 Anemia in chronic kidney disease: Secondary | ICD-10-CM | POA: Diagnosis not present

## 2019-12-09 DIAGNOSIS — N189 Chronic kidney disease, unspecified: Secondary | ICD-10-CM | POA: Diagnosis not present

## 2019-12-15 DIAGNOSIS — R809 Proteinuria, unspecified: Secondary | ICD-10-CM | POA: Diagnosis not present

## 2019-12-15 DIAGNOSIS — D631 Anemia in chronic kidney disease: Secondary | ICD-10-CM | POA: Diagnosis not present

## 2019-12-15 DIAGNOSIS — E1122 Type 2 diabetes mellitus with diabetic chronic kidney disease: Secondary | ICD-10-CM | POA: Diagnosis not present

## 2019-12-15 DIAGNOSIS — N189 Chronic kidney disease, unspecified: Secondary | ICD-10-CM | POA: Diagnosis not present

## 2019-12-15 DIAGNOSIS — I129 Hypertensive chronic kidney disease with stage 1 through stage 4 chronic kidney disease, or unspecified chronic kidney disease: Secondary | ICD-10-CM | POA: Diagnosis not present

## 2019-12-24 ENCOUNTER — Telehealth: Payer: Self-pay | Admitting: "Endocrinology

## 2019-12-27 ENCOUNTER — Other Ambulatory Visit: Payer: Self-pay

## 2019-12-27 DIAGNOSIS — I152 Hypertension secondary to endocrine disorders: Secondary | ICD-10-CM

## 2019-12-27 DIAGNOSIS — E782 Mixed hyperlipidemia: Secondary | ICD-10-CM

## 2019-12-27 DIAGNOSIS — E1159 Type 2 diabetes mellitus with other circulatory complications: Secondary | ICD-10-CM

## 2019-12-27 MED ORDER — LISINOPRIL 20 MG PO TABS: 20.0000 mg | ORAL_TABLET | Freq: Every day | ORAL | 0 refills | Status: DC

## 2019-12-27 MED ORDER — ROSUVASTATIN CALCIUM 5 MG PO TABS: 5.0000 mg | ORAL_TABLET | Freq: Every day | ORAL | 0 refills | Status: DC

## 2019-12-27 NOTE — Telephone Encounter (Signed)
Rx refills sent in earlier today

## 2019-12-27 NOTE — Telephone Encounter (Signed)
resent

## 2019-12-27 NOTE — Telephone Encounter (Signed)
Can you send these medications again? He just left the pharmacy and they do not have the refills. [employer]

## 2020-01-19 DIAGNOSIS — H401132 Primary open-angle glaucoma, bilateral, moderate stage: Secondary | ICD-10-CM | POA: Diagnosis not present

## 2020-01-19 DIAGNOSIS — Z961 Presence of intraocular lens: Secondary | ICD-10-CM | POA: Diagnosis not present

## 2020-01-19 DIAGNOSIS — H26493 Other secondary cataract, bilateral: Secondary | ICD-10-CM | POA: Diagnosis not present

## 2020-02-03 ENCOUNTER — Inpatient Hospital Stay (HOSPITAL_COMMUNITY): Payer: Medicare Other | Attending: Hematology

## 2020-02-03 ENCOUNTER — Other Ambulatory Visit: Payer: Self-pay

## 2020-02-03 DIAGNOSIS — N189 Chronic kidney disease, unspecified: Secondary | ICD-10-CM | POA: Diagnosis not present

## 2020-02-03 DIAGNOSIS — Z23 Encounter for immunization: Secondary | ICD-10-CM | POA: Diagnosis not present

## 2020-02-03 DIAGNOSIS — C61 Malignant neoplasm of prostate: Secondary | ICD-10-CM | POA: Diagnosis not present

## 2020-02-03 LAB — CBC WITH DIFFERENTIAL/PLATELET
Abs Immature Granulocytes: 0.02 10*3/uL (ref 0.00–0.07)
Basophils Absolute: 0 10*3/uL (ref 0.0–0.1)
Basophils Relative: 1 %
Eosinophils Absolute: 0.1 10*3/uL (ref 0.0–0.5)
Eosinophils Relative: 3 %
HCT: 35.7 % — ABNORMAL LOW (ref 39.0–52.0)
Hemoglobin: 12.1 g/dL — ABNORMAL LOW (ref 13.0–17.0)
Immature Granulocytes: 0 %
Lymphocytes Relative: 24 %
Lymphs Abs: 1.1 10*3/uL (ref 0.7–4.0)
MCH: 26.9 pg (ref 26.0–34.0)
MCHC: 33.9 g/dL (ref 30.0–36.0)
MCV: 79.5 fL — ABNORMAL LOW (ref 80.0–100.0)
Monocytes Absolute: 0.4 10*3/uL (ref 0.1–1.0)
Monocytes Relative: 8 %
Neutro Abs: 2.9 10*3/uL (ref 1.7–7.7)
Neutrophils Relative %: 64 %
Platelets: 154 10*3/uL (ref 150–400)
RBC: 4.49 MIL/uL (ref 4.22–5.81)
RDW: 13 % (ref 11.5–15.5)
WBC: 4.6 10*3/uL (ref 4.0–10.5)
nRBC: 0 % (ref 0.0–0.2)

## 2020-02-03 LAB — COMPREHENSIVE METABOLIC PANEL
ALT: 26 U/L (ref 0–44)
AST: 24 U/L (ref 15–41)
Albumin: 4.2 g/dL (ref 3.5–5.0)
Alkaline Phosphatase: 47 U/L (ref 38–126)
Anion gap: 9 (ref 5–15)
BUN: 34 mg/dL — ABNORMAL HIGH (ref 8–23)
CO2: 25 mmol/L (ref 22–32)
Calcium: 9.1 mg/dL (ref 8.9–10.3)
Chloride: 102 mmol/L (ref 98–111)
Creatinine, Ser: 2.49 mg/dL — ABNORMAL HIGH (ref 0.61–1.24)
GFR calc non Af Amer: 23 mL/min — ABNORMAL LOW (ref >60)
Glucose, Bld: 154 mg/dL — ABNORMAL HIGH (ref 70–99)
Potassium: 4.8 mmol/L (ref 3.5–5.1)
Sodium: 136 mmol/L (ref 135–145)
Total Bilirubin: 0.8 mg/dL (ref 0.3–1.2)
Total Protein: 7.6 g/dL (ref 6.5–8.1)

## 2020-02-03 LAB — IRON AND TIBC
Iron: 66 ug/dL (ref 45–182)
Saturation Ratios: 22 % (ref 17.9–39.5)
TIBC: 300 ug/dL (ref 250–450)
UIBC: 234 ug/dL

## 2020-02-03 LAB — PSA: Prostatic Specific Antigen: <0.01 ng/mL (ref 0.00–4.00)

## 2020-02-03 LAB — FOLATE: Folate: 12.2 ng/mL (ref >5.9)

## 2020-02-03 LAB — VITAMIN B12: Vitamin B-12: 701 pg/mL (ref 180–914)

## 2020-02-03 LAB — FERRITIN: Ferritin: 95 ng/mL (ref 24–336)

## 2020-02-03 LAB — VITAMIN D 25 HYDROXY (VIT D DEFICIENCY, FRACTURES): Vit D, 25-Hydroxy: 40.69 ng/mL (ref 30–100)

## 2020-02-03 LAB — LACTATE DEHYDROGENASE: LDH: 177 U/L (ref 98–192)

## 2020-02-10 ENCOUNTER — Ambulatory Visit (HOSPITAL_COMMUNITY): Payer: Medicare Other | Admitting: Nurse Practitioner

## 2020-02-11 DIAGNOSIS — E1129 Type 2 diabetes mellitus with other diabetic kidney complication: Secondary | ICD-10-CM | POA: Diagnosis not present

## 2020-02-11 DIAGNOSIS — E1122 Type 2 diabetes mellitus with diabetic chronic kidney disease: Secondary | ICD-10-CM | POA: Diagnosis not present

## 2020-02-11 DIAGNOSIS — N189 Chronic kidney disease, unspecified: Secondary | ICD-10-CM | POA: Diagnosis not present

## 2020-02-11 DIAGNOSIS — D631 Anemia in chronic kidney disease: Secondary | ICD-10-CM | POA: Diagnosis not present

## 2020-02-11 DIAGNOSIS — R809 Proteinuria, unspecified: Secondary | ICD-10-CM | POA: Diagnosis not present

## 2020-02-14 ENCOUNTER — Inpatient Hospital Stay (HOSPITAL_BASED_OUTPATIENT_CLINIC_OR_DEPARTMENT_OTHER): Payer: Medicare Other | Admitting: Hematology

## 2020-02-14 VITALS — BP 100/51 | HR 55 | Resp 16 | Wt 188.6 lb

## 2020-02-14 DIAGNOSIS — N189 Chronic kidney disease, unspecified: Secondary | ICD-10-CM | POA: Diagnosis not present

## 2020-02-14 DIAGNOSIS — C61 Malignant neoplasm of prostate: Secondary | ICD-10-CM | POA: Diagnosis not present

## 2020-02-14 DIAGNOSIS — Z23 Encounter for immunization: Secondary | ICD-10-CM | POA: Diagnosis not present

## 2020-02-14 MED ORDER — INFLUENZA VAC A&B SA ADJ QUAD 0.5 ML IM PRSY
0.5000 mL | PREFILLED_SYRINGE | Freq: Once | INTRAMUSCULAR | Status: AC
  Administered 2020-02-14: 0.5 mL via INTRAMUSCULAR
  Filled 2020-02-14: qty 0.5

## 2020-02-14 NOTE — Patient Instructions (Signed)
[facility] at [facility] Discharge Instructions  You were seen today by Dr. Katragadda. He went over your recent results. Dr. Katragadda will see you back in 6 months for labs and follow up.   Thank you for choosing [facility] at [facility] to provide your oncology and hematology care.  To afford each patient quality time with our provider, please arrive at least 15 minutes before your scheduled appointment time.   If you have a lab appointment with the Cancer Center please come in thru the Main Entrance and check in at the main information desk  You need to re-schedule your appointment should you arrive 10 or more minutes late.  We strive to give you quality time with our providers, and arriving late affects you and other patients whose appointments are after yours.  Also, if you no show three or more times for appointments you may be dismissed from the clinic at the providers discretion.     Again, thank you for choosing [facility].  Our hope is that these requests will decrease the amount of time that you wait before being seen by our physicians.       _____________________________________________________________  Should you have questions after your visit to [facility], please contact our office at [phone] between the hours of 8:00 a.m. and 4:30 p.m.  Voicemails left after 4:00 p.m. will not be returned until the following business day.  For prescription refill requests, have your pharmacy contact our office and allow 72 hours.    Cancer Center Support Programs:   > Cancer Support Group  2nd Tuesday of the month 1pm-2pm, Journey Room    

## 2020-02-14 NOTE — Addendum Note (Signed)
Addended by: Donetta Potts on: 02/14/2020 03:52 PM   Modules accepted: Orders

## 2020-02-14 NOTE — Progress Notes (Signed)
Richard Swanson presents today for injection per the provider's orders.  High Dose flu administration without incident; injection site WNL; see MAR for injection details.  Patient tolerated procedure well and without incident.  No questions or complaints noted at this time.

## 2020-02-14 NOTE — Progress Notes (Signed)
Richard Swanson, [place] [address]   CLINIC:  Medical Oncology/Hematology  PCP:  Vivi Barrack, MD [address] / [place] [address] [phone]   REASON FOR VISIT:  Follow-up for prostate cancer  PRIOR THERAPY:  1. Radical prostatectomy in 06/2004. 2. Radiation from 08/01/2010 to 09/14/2010.  NGS Results: Not done  CURRENT THERAPY: Observation  BRIEF ONCOLOGIC HISTORY:  Oncology History   No history exists.    CANCER STAGING: Cancer Staging No matching staging information was found for the patient.  INTERVAL HISTORY:  [name], a 85 y.o. male, returns for routine follow-up of his prostate. Richard Swanson was last seen by Dr. Walden Field on 07/30/2017.  Today he reports feeling well. He denies having any new bone pains. He denies having ankle swelling. He continues having ringing in his left ear and he denies light-headedness or dizziness.  He follows up with Dr. Theador Hawthorne every 3 months.   REVIEW OF SYSTEMS:  Review of Systems  Constitutional: Negative for appetite change and fatigue.  HENT:   Positive for tinnitus (L ear).   Cardiovascular: Negative for leg swelling.  Genitourinary: Positive for difficulty urinating.   Musculoskeletal: Negative for arthralgias.  Neurological: Negative for dizziness and light-headedness.  All other systems reviewed and are negative.   PAST MEDICAL/SURGICAL HISTORY:  Past Medical History:  Diagnosis Date  . Cancer [facility])    Prostate  . Chronic obstructive pulmonary disease, unspecified ([place])   . Diabetes mellitus without complication ([place])   . Dizziness   . Gastro-esophageal reflux disease without esophagitis   . Glaucoma   . Glomerular disorders in diseases classified elsewhere   . Gross hematuria   . Hypertension   . Hypertensive chronic kidney disease with stage 1 through stage 4 chronic kidney disease, or unspecified chronic kidney disease   . Kidney disease, chronic, stage III  (moderate, EGFR 30-59 ml/min)   . Malignant neoplasm of prostate ([place])   . Prostate cancer ([place]) 11/03/2010   dx 2006  . Type 2 diabetes mellitus with diabetic nephropathy ([place])   . Unspecified kidney failure    Past Surgical History:  Procedure Laterality Date  . PROSTATE SURGERY      SOCIAL HISTORY:  Social History   Socioeconomic History  . Marital status: Married    Spouse name: Not on file  . Number of children: Not on file  . Years of education: Not on file  . Highest education level: Not on file  Occupational History  . Not on file  Tobacco Use  . Smoking status: Never Smoker  . Smokeless tobacco: Never Used  Vaping Use  . Vaping Use: Never used  Substance and Sexual Activity  . Alcohol use: No  . Drug use: No  . Sexual activity: Not on file  Other Topics Concern  . Not on file  Social History Narrative  . Not on file   Social Determinants of Health   Financial Resource Strain:   . Difficulty of Paying Living Expenses: Not on file  Food Insecurity:   . Worried About Charity fundraiser in the Last Year: Not on file  . Ran Out of Food in the Last Year: Not on file  Transportation Needs:   . Lack of Transportation (Medical): Not on file  . Lack of Transportation (Non-Medical): Not on file  Physical Activity:   . Days of Exercise per Week: Not on file  . Minutes of Exercise per Session: Not on file  Stress:   . Feeling of Stress : Not on file  Social Connections:   . Frequency of Communication with Friends and Family: Not on file  . Frequency of Social Gatherings with Friends and Family: Not on file  . Attends Religious Services: Not on file  . Active Member of Clubs or Organizations: Not on file  . Attends Archivist Meetings: Not on file  . Marital Status: Not on file  Intimate Partner Violence:   . Fear of Current or Ex-Partner: Not on file  . Emotionally Abused: Not on file  . Physically Abused: Not on file  . Sexually Abused: Not on file     FAMILY HISTORY:  Family History  Problem Relation Age of Onset  . Stroke Father     CURRENT MEDICATIONS:  Current Outpatient Medications  Medication Sig Dispense Refill  . amLODipine (NORVASC) 10 MG tablet Take 1 tablet (10 mg total) by mouth daily. 90 tablet 3  . famotidine (PEPCID) 20 MG tablet Take 1 tablet (20 mg total) by mouth 2 (two) times daily. 180 tablet 3  . GLIPIZIDE XL 5 MG 24 hr tablet TAKE ONE TABLET BY MOUTH ONCE DAILY WITH BREAKFAST. 90 tablet 0  . JANUVIA 50 MG tablet TAKE ONE TABLET BY MOUTH ONCE DAILY. 30 tablet 0  . latanoprost (XALATAN) 0.005 % ophthalmic solution     . lisinopril (ZESTRIL) 20 MG tablet Take 1 tablet (20 mg total) by mouth daily. 90 tablet 0  . metoprolol succinate (TOPROL-XL) 25 MG 24 hr tablet Take 1 tablet (25 mg total) by mouth daily. 90 tablet 3  . rosuvastatin (CRESTOR) 5 MG tablet Take 1 tablet (5 mg total) by mouth daily. 90 tablet 0  . VYZULTA 0.024 % SOLN Apply 1 drop to eye at bedtime.     No current facility-administered medications for this visit.    ALLERGIES:  No Known Allergies  PHYSICAL EXAM:  Performance status (ECOG): 1 - Symptomatic but completely ambulatory  Vitals:   02/14/20 1429  BP: (!) 100/51  Pulse: (!) 55  Resp: 16  SpO2: 100%   Wt Readings from Last 3 Encounters:  02/14/20 188 lb 9.6 oz (85.5 kg)  11/30/19 183 lb (83 kg)  11/30/19 183 lb 9.6 oz (83.3 kg)   Physical Exam Vitals reviewed.  Constitutional:      Appearance: Normal appearance.  Cardiovascular:     Rate and Rhythm: Normal rate and regular rhythm.     Pulses: Normal pulses.     Heart sounds: Normal heart sounds.  Pulmonary:     Effort: Pulmonary effort is normal.     Breath sounds: Normal breath sounds.  Musculoskeletal:     Right lower leg: No edema.     Left lower leg: No edema.  Neurological:     General: No focal deficit present.     Mental Status: He is alert and oriented to person, place, and time.  Psychiatric:         Mood and Affect: Mood normal.        Behavior: Behavior normal.      LABORATORY DATA:  I have reviewed the labs as listed.  CBC Latest Ref Rng & Units 02/03/2020 07/29/2019 05/14/2019  WBC 4.0 - 10.5 K/uL 4.6 4.3 5.3  Hemoglobin 13.0 - 17.0 g/dL 12.1(L) 12.7(L) 13.1(A)  Hematocrit 39 - 52 % 35.7(L) 37.4(L) 37(A)  Platelets 150 - 400 K/uL 154 175 210   CMP Latest Ref Rng & Units 02/03/2020 07/29/2019 05/14/2019  Glucose 70 - 99 mg/dL 154(H) 168(H) -  BUN 8 - 23 mg/dL 34(H) 38(H) 26(A)  Creatinine 0.61 - 1.24 mg/dL 2.49(H) 2.31(H) 2.4(A)  Sodium 135 - 145 mmol/L 136 138 140  Potassium 3.5 - 5.1 mmol/L 4.8 4.8 4.0  Chloride 98 - 111 mmol/L 102 104 103  CO2 22 - 32 mmol/L 25 25 21   Calcium 8.9 - 10.3 mg/dL 9.1 9.4 9.7  Total Protein 6.5 - 8.1 g/dL 7.6 7.8 -  Total Bilirubin 0.3 - 1.2 mg/dL 0.8 0.8 -  Alkaline Phos 38 - 126 U/L 47 53 -  AST 15 - 41 U/L 24 18 -  ALT 0 - 44 U/L 26 22 -   Lab Results  Component Value Date   LDH 177 02/03/2020   LDH 156 07/29/2019   LDH 152 01/29/2019   Lab Results  Component Value Date   TIBC 300 02/03/2020   TIBC 273 05/14/2019   FERRITIN 95 02/03/2020   FERRITIN 190 05/14/2019   IRONPCTSAT 22 02/03/2020   Lab Results  Component Value Date   PSA <0.1 05/05/2017   PSA <0.01 07/30/2016   PSA 0.01 01/30/2016    DIAGNOSTIC IMAGING:  I have independently reviewed the scans and discussed with the patient. No results found.   ASSESSMENT:  1.  Prostate cancer: -Gleason score 5, status post radical prostatectomy in March 2006 by Dr. Michela Pitcher. -Status post radiation by Dr. Valere Dross on 08/01/2010 through 09/14/2010  2.  CKD: -Followed by Dr. Theador Hawthorne.   PLAN:  1.  Prostate cancer: -Latest PSA is undetectable.  No clinical signs or symptoms of recurrence. -RTC 6 months with labs and follow-up.  2.  Normocytic anemia: -Mild anemia in the setting of CKD. -Hemoglobin 12.1 with ferritin 95 and percent saturation of 22.    Orders placed this  encounter:  No orders of the defined types were placed in this encounter.    Derek Jack, MD [place] [phone]   I, Milinda Antis, am acting as a scribe for Dr. Sanda Linger.  I, Derek Jack MD, have reviewed the above documentation for accuracy and completeness, and I agree with the above.

## 2020-02-15 ENCOUNTER — Other Ambulatory Visit: Payer: Self-pay | Admitting: "Endocrinology

## 2020-02-18 DIAGNOSIS — N189 Chronic kidney disease, unspecified: Secondary | ICD-10-CM | POA: Diagnosis not present

## 2020-02-18 DIAGNOSIS — R809 Proteinuria, unspecified: Secondary | ICD-10-CM | POA: Diagnosis not present

## 2020-02-18 DIAGNOSIS — D631 Anemia in chronic kidney disease: Secondary | ICD-10-CM | POA: Diagnosis not present

## 2020-02-18 DIAGNOSIS — I129 Hypertensive chronic kidney disease with stage 1 through stage 4 chronic kidney disease, or unspecified chronic kidney disease: Secondary | ICD-10-CM | POA: Diagnosis not present

## 2020-02-18 DIAGNOSIS — E875 Hyperkalemia: Secondary | ICD-10-CM | POA: Diagnosis not present

## 2020-03-01 DIAGNOSIS — Z961 Presence of intraocular lens: Secondary | ICD-10-CM | POA: Diagnosis not present

## 2020-03-01 DIAGNOSIS — H35033 Hypertensive retinopathy, bilateral: Secondary | ICD-10-CM | POA: Diagnosis not present

## 2020-03-01 DIAGNOSIS — H401132 Primary open-angle glaucoma, bilateral, moderate stage: Secondary | ICD-10-CM | POA: Diagnosis not present

## 2020-03-01 DIAGNOSIS — H26493 Other secondary cataract, bilateral: Secondary | ICD-10-CM | POA: Diagnosis not present

## 2020-03-03 DIAGNOSIS — E1122 Type 2 diabetes mellitus with diabetic chronic kidney disease: Secondary | ICD-10-CM | POA: Diagnosis not present

## 2020-03-03 DIAGNOSIS — R809 Proteinuria, unspecified: Secondary | ICD-10-CM | POA: Diagnosis not present

## 2020-03-03 DIAGNOSIS — E1129 Type 2 diabetes mellitus with other diabetic kidney complication: Secondary | ICD-10-CM | POA: Diagnosis not present

## 2020-03-03 DIAGNOSIS — N189 Chronic kidney disease, unspecified: Secondary | ICD-10-CM | POA: Diagnosis not present

## 2020-03-03 DIAGNOSIS — D631 Anemia in chronic kidney disease: Secondary | ICD-10-CM | POA: Diagnosis not present

## 2020-03-13 ENCOUNTER — Other Ambulatory Visit: Payer: Self-pay | Admitting: "Endocrinology

## 2020-03-14 ENCOUNTER — Other Ambulatory Visit: Payer: Self-pay

## 2020-03-15 MED ORDER — SITAGLIPTIN PHOSPHATE 50 MG PO TABS
50.0000 mg | ORAL_TABLET | Freq: Every day | ORAL | 0 refills | Status: DC
Start: 2020-03-15 — End: 2020-05-12

## 2020-03-16 ENCOUNTER — Ambulatory Visit: Payer: Medicare Other | Attending: Internal Medicine

## 2020-03-16 DIAGNOSIS — Z23 Encounter for immunization: Secondary | ICD-10-CM

## 2020-03-16 NOTE — Progress Notes (Signed)
   Covid-19 Vaccination Clinic  Name:  Richard Swanson    MRN: [id number] DOB: 1935/02/14  03/16/2020  Richard Swanson was observed post Covid-19 immunization for 15 minutes without incident. He was provided with Vaccine Information Sheet and instruction to access the V-Safe system.   Richard Swanson was instructed to call 911 with any severe reactions post vaccine: Marland Kitchen Difficulty breathing  . Swelling of face and throat  . A fast heartbeat  . A bad rash all over body  . Dizziness and weakness   Immunizations Administered    No immunizations on file.

## 2020-04-03 ENCOUNTER — Other Ambulatory Visit: Payer: Self-pay

## 2020-04-03 ENCOUNTER — Ambulatory Visit (INDEPENDENT_AMBULATORY_CARE_PROVIDER_SITE_OTHER): Payer: Medicare Other

## 2020-04-03 VITALS — BP 118/60 | HR 55 | Temp 97.7°F | Resp 20 | Wt 182.0 lb

## 2020-04-03 DIAGNOSIS — Z0000 Encounter for general adult medical examination without abnormal findings: Secondary | ICD-10-CM | POA: Diagnosis not present

## 2020-04-03 NOTE — Patient Instructions (Addendum)
[name] , Thank you for taking time to come for your Medicare Wellness Visit. I appreciate your ongoing commitment to your health goals. Please review the following plan we discussed and let me know if I can assist you in the future.   Screening recommendations/referrals: Colonoscopy: No longer required Recommended yearly ophthalmology/optometry visit for glaucoma screening and checkup Recommended yearly dental visit for hygiene and checkup  Vaccinations: Influenza vaccine: Done 02/14/20 Pneumococcal vaccine: PPSV 23 Done 01/28/19 Tdap vaccine: due and discussed Shingles vaccine: Shingrix discussed. Please contact your pharmacy for coverage information.    Covid-19: Completed 2/6, 3/9, & 03/16/20  Advanced directives: Advance directive discussed with you today. I have provided a copy for you to complete at home and have notarized. Once this is complete please bring a copy in to our office so we can scan it into your chart.  Conditions/risks identified: maintain health  Next appointment: Follow up in one year for your annual wellness visit.   Preventive Care 28 Years and Older, Male Preventive care refers to lifestyle choices and visits with your health care provider that can promote health and wellness. What does preventive care include?  A yearly physical exam. This is also called an annual well check.  Dental exams once or twice a year.  Routine eye exams. Ask your health care provider how often you should have your eyes checked.  Personal lifestyle choices, including:  Daily care of your teeth and gums.  Regular physical activity.  Eating a healthy diet.  Avoiding tobacco and drug use.  Limiting alcohol use.  Practicing safe sex.  Taking low doses of aspirin every day.  Taking vitamin and mineral supplements as recommended by your health care provider. What happens during an annual well check? The services and screenings done by your health care provider during  your annual well check will depend on your age, overall health, lifestyle risk factors, and family history of disease. Counseling  Your health care provider may ask you questions about your:  Alcohol use.  Tobacco use.  Drug use.  Emotional well-being.  Home and relationship well-being.  Sexual activity.  Eating habits.  History of falls.  Memory and ability to understand (cognition).  Work and work Statistician. Screening  You may have the following tests or measurements:  Height, weight, and BMI.  Blood pressure.  Lipid and cholesterol levels. These may be checked every 5 years, or more frequently if you are over 76 years old.  Skin check.  Lung cancer screening. You may have this screening every year starting at age 11 if you have a 30-pack-year history of smoking and currently smoke or have quit within the past 15 years.  Fecal occult blood test (FOBT) of the stool. You may have this test every year starting at age 59.  Flexible sigmoidoscopy or colonoscopy. You may have a sigmoidoscopy every 5 years or a colonoscopy every 10 years starting at age 61.  Prostate cancer screening. Recommendations will vary depending on your family history and other risks.  Hepatitis C blood test.  Hepatitis B blood test.  Sexually transmitted disease (STD) testing.  Diabetes screening. This is done by checking your blood sugar (glucose) after you have not eaten for a while (fasting). You may have this done every 1-3 years.  Abdominal aortic aneurysm (AAA) screening. You may need this if you are a current or former smoker.  Osteoporosis. You may be screened starting at age 40 if you are at high risk. Talk with your  health care provider about your test results, treatment options, and if necessary, the need for more tests. Vaccines  Your health care provider may recommend certain vaccines, such as:  Influenza vaccine. This is recommended every year.  Tetanus, diphtheria, and  acellular pertussis (Tdap, Td) vaccine. You may need a Td booster every 10 years.  Zoster vaccine. You may need this after age 58.  Pneumococcal 13-valent conjugate (PCV13) vaccine. One dose is recommended after age 53.  Pneumococcal polysaccharide (PPSV23) vaccine. One dose is recommended after age 65. Talk to your health care provider about which screenings and vaccines you need and how often you need them. This information is not intended to replace advice given to you by your health care provider. Make sure you discuss any questions you have with your health care provider. Document Released: 05/12/2015 Document Revised: 01/03/2016 Document Reviewed: 02/14/2015 Elsevier Interactive Patient Education  2017 [place] Prevention in the Home Falls can cause injuries. They can happen to people of all ages. There are many things you can do to make your home safe and to help prevent falls. What can I do on the outside of my home?  Regularly fix the edges of walkways and driveways and fix any cracks.  Remove anything that might make you trip as you walk through a door, such as a raised step or threshold.  Trim any bushes or trees on the path to your home.  Use bright outdoor lighting.  Clear any walking paths of anything that might make someone trip, such as rocks or tools.  Regularly check to see if handrails are loose or broken. Make sure that both sides of any steps have handrails.  Any raised decks and porches should have guardrails on the edges.  Have any leaves, snow, or ice cleared regularly.  Use sand or salt on walking paths during winter.  Clean up any spills in your garage right away. This includes oil or grease spills. What can I do in the bathroom?  Use night lights.  Install grab bars by the toilet and in the tub and shower. Do not use towel bars as grab bars.  Use non-skid mats or decals in the tub or shower.  If you need to sit down in the shower, use  a plastic, non-slip stool.  Keep the floor dry. Clean up any water that spills on the floor as soon as it happens.  Remove soap buildup in the tub or shower regularly.  Attach bath mats securely with double-sided non-slip rug tape.  Do not have throw rugs and other things on the floor that can make you trip. What can I do in the bedroom?  Use night lights.  Make sure that you have a light by your bed that is easy to reach.  Do not use any sheets or blankets that are too big for your bed. They should not hang down onto the floor.  Have a firm chair that has side arms. You can use this for support while you get dressed.  Do not have throw rugs and other things on the floor that can make you trip. What can I do in the kitchen?  Clean up any spills right away.  Avoid walking on wet floors.  Keep items that you use a lot in easy-to-reach places.  If you need to reach something above you, use a strong step stool that has a grab bar.  Keep electrical cords out of the way.  Do not use  floor polish or wax that makes floors slippery. If you must use wax, use non-skid floor wax.  Do not have throw rugs and other things on the floor that can make you trip. What can I do with my stairs?  Do not leave any items on the stairs.  Make sure that there are handrails on both sides of the stairs and use them. Fix handrails that are broken or loose. Make sure that handrails are as long as the stairways.  Check any carpeting to make sure that it is firmly attached to the stairs. Fix any carpet that is loose or worn.  Avoid having throw rugs at the top or bottom of the stairs. If you do have throw rugs, attach them to the floor with carpet tape.  Make sure that you have a light switch at the top of the stairs and the bottom of the stairs. If you do not have them, ask someone to add them for you. What else can I do to help prevent falls?  Wear shoes that:  Do not have high heels.  Have  rubber bottoms.  Are comfortable and fit you well.  Are closed at the toe. Do not wear sandals.  If you use a stepladder:  Make sure that it is fully opened. Do not climb a closed stepladder.  Make sure that both sides of the stepladder are locked into place.  Ask someone to hold it for you, if possible.  Clearly mark and make sure that you can see:  Any grab bars or handrails.  First and last steps.  Where the edge of each step is.  Use tools that help you move around (mobility aids) if they are needed. These include:  Canes.  Walkers.  Scooters.  Crutches.  Turn on the lights when you go into a dark area. Replace any light bulbs as soon as they burn out.  Set up your furniture so you have a clear path. Avoid moving your furniture around.  If any of your floors are uneven, fix them.  If there are any pets around you, be aware of where they are.  Review your medicines with your doctor. Some medicines can make you feel dizzy. This can increase your chance of falling. Ask your doctor what other things that you can do to help prevent falls. This information is not intended to replace advice given to you by your health care provider. Make sure you discuss any questions you have with your health care provider. Document Released: 02/09/2009 Document Revised: 09/21/2015 Document Reviewed: 05/20/2014 Elsevier Interactive Patient Education  2017 [employer].

## 2020-04-03 NOTE — Progress Notes (Addendum)
Subjective:   Richard Swanson is a 85 y.o. male who presents for an Initial Medicare Annual Wellness Visit.  Review of Systems     Cardiac Risk Factors include: advanced age (>82mn, >>72women);diabetes mellitus;dyslipidemia;hypertension;male gender     Objective:    Today's Vitals   04/03/20 1252 04/03/20 1305  BP:  118/60  Pulse:  (!) 55  Resp:  20  Temp:  97.7 F (36.5 C)  SpO2:  98%  Weight:  182 lb (82.6 kg)  PainSc: 2     Body mass index is 24.68 kg/m.  Advanced Directives 04/03/2020 02/14/2020 08/05/2019 02/04/2019 08/05/2018 07/30/2017 01/29/2017  Does Patient Have a Medical Advance Directive? No No No No No No No  Would patient like information on creating a medical advance directive? Yes (MAU/Ambulatory/Procedural Areas - Information given) No - Patient declined No - Patient declined Yes (MAU/Ambulatory/Procedural Areas - Information given) No - Patient declined No - Patient declined No - Patient declined  Pre-existing out of facility DNR order (yellow form or pink MOST form) - - - - - - -    Current Medications (verified) Outpatient Encounter Medications as of 04/03/2020  Medication Sig  . amLODipine (NORVASC) 10 MG tablet Take 1 tablet (10 mg total) by mouth daily.  .Marland KitchenGLIPIZIDE XL 5 MG 24 hr tablet TAKE ONE TABLET BY MOUTH ONCE DAILY WITH BREAKFAST.  .Marland Kitchenlisinopril (ZESTRIL) 20 MG tablet Take 1 tablet (20 mg total) by mouth daily.  . metoprolol succinate (TOPROL-XL) 25 MG 24 hr tablet Take 1 tablet (25 mg total) by mouth daily.  . sitaGLIPtin (JANUVIA) 50 MG tablet Take 1 tablet (50 mg total) by mouth daily.  .Marland KitchenVYZULTA 0.024 % SOLN Apply 1 drop to eye at bedtime.  .Marland Kitchenlatanoprost (XALATAN) 0.005 % ophthalmic solution  (Patient not taking: Reported on 04/03/2020)  . rosuvastatin (CRESTOR) 5 MG tablet Take 1 tablet (5 mg total) by mouth daily. (Patient not taking: Reported on 04/03/2020)  . [DISCONTINUED] famotidine (PEPCID) 20 MG tablet Take 1 tablet (20 mg total) by mouth 2  (two) times daily. (Patient not taking: Reported on 04/03/2020)   No facility-administered encounter medications on file as of 04/03/2020.    Allergies (verified) Patient has no known allergies.   History: Past Medical History:  Diagnosis Date  . Cancer ([facility]    Prostate  . Chronic [place] [facility] disease, unspecified ([facility]   . Diabetes mellitus without complication ([facility]   . Dizziness   . Gastro-esophageal reflux disease without esophagitis   . Glaucoma   . Glomerular disorders in diseases classified elsewhere   . Gross hematuria   . Hypertension   . Hypertensive chronic kidney disease with stage 1 through stage 4 chronic kidney disease, or unspecified chronic kidney disease   . Kidney disease, chronic, stage III (moderate, EGFR 30-59 ml/min) (HCC)   . [place] of [place]   . [facility]state [facility] ([facility] 11/03/2010   dx 2006  . Type 2 diabetes mellitus with diabetic nephropathy ([facility]   . Unspecified kidney failure    Past Surgical History:  Procedure Laterality Date  . PROSTATE SURGERY     Family History  Problem Relation Age of Onset  . Stroke Father    Social History   Socioeconomic History  . Marital status: Married    Spouse name: Not on file  . Number of children: Not on file  . Years of education: Not on file  . Highest education level: Not on file  Occupational History  .  Occupation: Retired  Tobacco Use  . Smoking status: Never Smoker  . Smokeless tobacco: Never Used  Vaping Use  . Vaping Use: Never used  Substance and Sexual Activity  . Alcohol use: No  . Drug use: No  . Sexual activity: Not on file  Other Topics Concern  . Not on file  Social History Narrative  . Not on file   Social Determinants of Health   Financial Resource Strain: Low Risk   . Difficulty of Paying Living Expenses: Not hard at all  Food Insecurity: No Food Insecurity  . Worried About Charity fundraiser in the Last Year: Never true  . Ran Out of Food in the  Last Year: Never true  Transportation Needs: No Transportation Needs  . Lack of Transportation (Medical): No  . Lack of Transportation (Non-Medical): No  Physical Activity: Sufficiently Active  . Days of Exercise per Week: 3 days  . Minutes of Exercise per Session: 60 min  Stress: No Stress Concern Present  . Feeling of Stress : Not at all  Social Connections: Socially Integrated  . Frequency of Communication with Friends and Family: Twice a week  . Frequency of Social Gatherings with Friends and Family: More than three times a week  . Attends Religious Services: More than 4 times per year  . Active Member of Clubs or Organizations: Yes  . Attends Archivist Meetings: 1 to 4 times per year  . Marital Status: Married    Tobacco Counseling Counseling given: Not Answered   Clinical Intake:  Pre-visit preparation completed: Yes  Pain : 0-10 (left shoulder) Pain Score: 2  Pain Type: Chronic pain Pain Location: Shoulder Pain Orientation: Left Pain Descriptors / Indicators: Other (Comment) (just enough to know it's there) Pain Onset: More than a month ago Pain Frequency: Intermittent     BMI - recorded: 24.68 Nutritional Status: BMI of 19-24  Normal Nutritional Risks: None Diabetes: Yes CBG done?: Yes (167) CBG resulted in Enter/ Edit results?: No Did pt. bring in CBG monitor from home?: No  How often do you need to have someone help you when you read instructions, pamphlets, or other written materials from your doctor or pharmacy?: 1 - Never  Diabetic?Nutrition Risk Assessment:  Has the patient had any N/V/D within the last 2 months?  No  Does the patient have any non-healing wounds?  No  Has the patient had any unintentional weight loss or weight gain?  No   Diabetes:  Is the patient diabetic?  Yes  If diabetic, was a CBG obtained today?  Yes  Did the patient bring in their glucometer from home?  No  How often do you monitor your CBG's? Daily.    Financial Strains and Diabetes Management:  Are you having any financial strains with the device, your supplies or your medication? No .  Does the patient want to be seen by Chronic Care Management for management of their diabetes?  No  Would the patient like to be referred to a Nutritionist or for Diabetic Management?  No   Diabetic Exams:  Diabetic Eye Exam: Overdue for diabetic eye exam. Pt has been advised about the importance in completing this exam. Patient advised to call and schedule an eye exam. Diabetic Foot Exam: Completed 05/18/19   Interpreter Needed?: No  Information entered by :: Charlott Rakes, LPN   Activities of Daily Living In your present state of health, do you have any difficulty performing the following activities: 04/03/2020  Hearing?  Y  Comment mild loss with ringing in ears  Vision? N  Difficulty concentrating or making decisions? N  Walking or climbing stairs? N  Dressing or bathing? N  Doing errands, shopping? N  Preparing Food and eating ? N  Using the Toilet? N  In the past six months, have you accidently leaked urine? N  Comment excessive urinating at night  Do you have problems with loss of bowel control? N  Managing your Medications? N  Managing your Finances? N  Housekeeping or managing your Housekeeping? N  Some recent data might be hidden    Patient Care Team: Vivi Barrack, MD as PCP - General (Family Medicine) Herminio Commons, MD (Inactive) as PCP - Cardiology (Cardiology) Liana Gerold, MD as Consulting Physician (Nephrology)  Indicate any recent Medical Services you may have received from other than [facility] providers in the past year (date may be approximate).     Assessment:   This is a routine wellness examination for Media.  Hearing/Vision screen  Hearing Screening   125Hz  250Hz  500Hz  1000Hz  2000Hz  3000Hz  4000Hz  6000Hz  8000Hz   Right ear:           Left ear:           Comments: Ringing in ear with mild  loss  Vision Screening Comments: Pt follows up with [facility] for annual exams  Dietary issues and exercise activities discussed: Current Exercise Habits: Home exercise routine, Type of exercise: walking;treadmill;strength training/weights, Time (Minutes): 60, Frequency (Times/Week): 3, Weekly Exercise (Minutes/Week): 180  Goals    . Patient Stated     Maintain health      Depression Screen PHQ 2/9 Scores 04/03/2020 11/16/2019 08/19/2019 05/31/2019 05/25/2019 05/10/2019 01/24/2014  PHQ - 2 Score 0 0 0 0 0 0 0  Exception Documentation - - - - Medical reason Medical reason -    Fall Risk Fall Risk  04/03/2020 11/30/2019 11/16/2019 08/23/2019 08/19/2019  Falls in the past year? 0 0 0 0 0  Number falls in past yr: 0 0 - 0 0  Injury with Fall? 0 0 - 0 0  Risk for fall due to : Impaired vision - - - -  Follow up Falls prevention discussed Falls prevention discussed - - Falls evaluation completed    FALL RISK PREVENTION PERTAINING TO THE HOME:  Any stairs in or around the home? No  If so, are there any without handrails? No  Home free of loose throw rugs in walkways, pet beds, electrical cords, etc? Yes  Adequate lighting in your home to reduce risk of falls? Yes   ASSISTIVE DEVICES UTILIZED TO PREVENT FALLS:  Life alert? No  Use of a cane, walker or w/c? No  Grab bars in the bathroom? No  Shower chair or bench in shower? No  Elevated toilet seat or a handicapped toilet? Yes   TIMED UP AND GO:  Was the test performed? Yes .  Length of time to ambulate 10 feet: 15 sec.   Gait steady and fast without use of assistive device  Cognitive Function:     6CIT Screen 04/03/2020  What Year? 0 points  What month? 0 points  Count back from 20 0 points  Months in reverse 0 points  Repeat phrase 0 points    Immunizations Immunization History  Administered Date(s) Administered  . Fluad Quad(high Dose 65+) 02/14/2020  . Influenza, High Dose Seasonal PF 03/23/2018  . Influenza,inj,Quad  PF,6+ Mos 01/30/2016, 01/29/2017, 01/28/2019  . Moderna SARS-COV2  Booster Vaccination 03/16/2020  . Moderna SARS-COVID-2 Vaccination 06/05/2019, 07/06/2019  . Pneumococcal Polysaccharide-23 01/28/2019    TDAP status: Due, Education has been provided regarding the importance of this vaccine. Advised may receive this vaccine at local pharmacy or Health Dept. Aware to provide a copy of the vaccination record if obtained from local pharmacy or Health Dept. Verbalized acceptance and understanding.  Flu Vaccine status: Up to date  Pneumococcal vaccine status: Declined,  Education has been provided regarding the importance of this vaccine but patient still declined. Advised may receive this vaccine at local pharmacy or Health Dept. Aware to provide a copy of the vaccination record if obtained from local pharmacy or Health Dept. Verbalized acceptance and understanding.   Covid-19 vaccine status: Completed vaccines  Qualifies for Shingles Vaccine? Yes   Zostavax completed No   Shingrix Completed?: No.    Education has been provided regarding the importance of this vaccine. Patient has been advised to call insurance company to determine out of pocket expense if they have not yet received this vaccine. Advised may also receive vaccine at local pharmacy or Health Dept. Verbalized acceptance and understanding.  Screening Tests Health Maintenance  Topic Date Due  . OPHTHALMOLOGY EXAM  04/05/2020 (Originally 07/18/1944)  . PNA vac Low Risk Adult (2 of 2 - PCV13) 04/05/2020 (Originally 01/28/2020)  . TETANUS/TDAP  11/15/2020 (Originally 07/18/1953)  . FOOT EXAM  05/17/2020  . HEMOGLOBIN A1C  06/01/2020  . INFLUENZA VACCINE  Completed  . COVID-19 Vaccine  Completed    Health Maintenance  There are no preventive care reminders to display for this patient.  Colorectal cancer screening: No longer required.    Additional Screening:   Vision Screening: Recommended annual ophthalmology exams for early  detection of glaucoma and other disorders of the eye. Is the patient up to date with their annual eye exam?  Yes  Who is the provider or what is the name of the office in which the patient attends annual eye exams? Believes in July with [facility]  Dental Screening: Recommended annual dental exams for proper oral hygiene  Community Resource Referral / Chronic Care Management: CRR required this visit?  No   CCM required this visit?  No      Plan:     I have personally reviewed and noted the following in the patient's chart:   . Medical and social history . Use of alcohol, tobacco or illicit drugs  . Current medications and supplements . Functional ability and status . Nutritional status . Physical activity . Advanced directives . List of other physicians . Hospitalizations, surgeries, and ER visits in previous 12 months . Vitals . Screenings to include cognitive, depression, and falls . Referrals and appointments  In addition, I have reviewed and discussed with patient certain preventive protocols, quality metrics, and best practice recommendations. A written personalized care plan for preventive services as well as general preventive health recommendations were provided to patient.     Willette Brace, LPN   03/07/4173   Nurse Notes: Pt has scheduled an appt to discuss with you on 04/06/20 , lab work for cholesterol, a med refill on the crestor, excessive urination at night, and having his Pneumonia vaccine given at appt after he discusses with you.pt is concerned about the cost of [place] as well

## 2020-04-04 ENCOUNTER — Encounter: Payer: Medicare Other | Attending: "Endocrinology | Admitting: Nutrition

## 2020-04-04 ENCOUNTER — Ambulatory Visit (INDEPENDENT_AMBULATORY_CARE_PROVIDER_SITE_OTHER): Payer: Medicare Other | Admitting: Nurse Practitioner

## 2020-04-04 ENCOUNTER — Encounter: Payer: Self-pay | Admitting: Nurse Practitioner

## 2020-04-04 ENCOUNTER — Other Ambulatory Visit: Payer: Self-pay

## 2020-04-04 ENCOUNTER — Encounter: Payer: Self-pay | Admitting: Nutrition

## 2020-04-04 VITALS — BP 141/72 | HR 55 | Ht 72.0 in | Wt 184.0 lb

## 2020-04-04 VITALS — Ht 72.0 in | Wt 184.0 lb

## 2020-04-04 DIAGNOSIS — E1121 Type 2 diabetes mellitus with diabetic nephropathy: Secondary | ICD-10-CM | POA: Insufficient documentation

## 2020-04-04 DIAGNOSIS — E1159 Type 2 diabetes mellitus with other circulatory complications: Secondary | ICD-10-CM

## 2020-04-04 DIAGNOSIS — N183 Chronic kidney disease, stage 3 unspecified: Secondary | ICD-10-CM | POA: Diagnosis not present

## 2020-04-04 DIAGNOSIS — I152 Hypertension secondary to endocrine disorders: Secondary | ICD-10-CM

## 2020-04-04 DIAGNOSIS — R6889 Other general symptoms and signs: Secondary | ICD-10-CM | POA: Diagnosis not present

## 2020-04-04 DIAGNOSIS — E782 Mixed hyperlipidemia: Secondary | ICD-10-CM

## 2020-04-04 DIAGNOSIS — I10 Essential (primary) hypertension: Secondary | ICD-10-CM | POA: Diagnosis not present

## 2020-04-04 LAB — POCT GLYCOSYLATED HEMOGLOBIN (HGB A1C): HbA1c, POC (controlled diabetic range): 7.6 % — AB (ref 0.0–7.0)

## 2020-04-04 NOTE — Progress Notes (Signed)
04/04/2020, 10:32 AM   Endocrinology follow-up note  Subjective:    Patient ID: Richard Swanson, male    DOB: [birth year].  Richard Swanson is being seen in follow-up after he was seen in consultation for management of currently uncontrolled symptomatic diabetes requested by  Vivi Barrack, MD.   Past Medical History:  Diagnosis Date  . Cancer [facility])    Prostate  . Chronic obstructive pulmonary disease, unspecified ([place])   . Diabetes mellitus without complication ([place])   . Dizziness   . Gastro-esophageal reflux disease without esophagitis   . Glaucoma   . Glomerular disorders in diseases classified elsewhere   . Gross hematuria   . Hypertension   . Hypertensive chronic kidney disease with stage 1 through stage 4 chronic kidney disease, or unspecified chronic kidney disease   . Kidney disease, chronic, stage III (moderate, EGFR 30-59 ml/min) (HCC)   . Malignant neoplasm of prostate ([place])   . Prostate cancer ([place]) 11/03/2010   dx 2006  . Type 2 diabetes mellitus with diabetic nephropathy ([place])   . Unspecified kidney failure     Past Surgical History:  Procedure Laterality Date  . PROSTATE SURGERY      Social History   Socioeconomic History  . Marital status: Married    Spouse name: Not on file  . Number of children: Not on file  . Years of education: Not on file  . Highest education level: Not on file  Occupational History  . Occupation: Retired  Tobacco Use  . Smoking status: Never Smoker  . Smokeless tobacco: Never Used  Vaping Use  . Vaping Use: Never used  Substance and Sexual Activity  . Alcohol use: No  . Drug use: No  . Sexual activity: Not on file  Other Topics Concern  . Not on file  Social History Narrative  . Not on file   Social Determinants of Health   Financial Resource Strain: Low Risk   . Difficulty of Paying Living Expenses: Not hard at all  Food Insecurity: No  Food Insecurity  . Worried About Charity fundraiser in the Last Year: Never true  . Ran Out of Food in the Last Year: Never true  Transportation Needs: No Transportation Needs  . Lack of Transportation (Medical): No  . Lack of Transportation (Non-Medical): No  Physical Activity: Sufficiently Active  . Days of Exercise per Week: 3 days  . Minutes of Exercise per Session: 60 min  Stress: No Stress Concern Present  . Feeling of Stress : Not at all  Social Connections: Socially Integrated  . Frequency of Communication with Friends and Family: Twice a week  . Frequency of Social Gatherings with Friends and Family: More than three times a week  . Attends Religious Services: More than 4 times per year  . Active Member of Clubs or Organizations: Yes  . Attends Archivist Meetings: 1 to 4 times per year  . Marital Status: Married    Family History  Problem Relation Age of Onset  . Stroke Father     Outpatient Encounter Medications as of 04/04/2020  Medication Sig  . amLODipine (NORVASC) 10 MG tablet Take 1  tablet (10 mg total) by mouth daily.  Marland Kitchen GLIPIZIDE XL 5 MG 24 hr tablet TAKE ONE TABLET BY MOUTH ONCE DAILY WITH BREAKFAST.  Marland Kitchen lisinopril (ZESTRIL) 20 MG tablet Take 1 tablet (20 mg total) by mouth daily.  . metoprolol succinate (TOPROL-XL) 25 MG 24 hr tablet Take 1 tablet (25 mg total) by mouth daily.  . sitaGLIPtin (JANUVIA) 50 MG tablet Take 1 tablet (50 mg total) by mouth daily.  Marland Kitchen VYZULTA 0.024 % SOLN Apply 1 drop to eye at bedtime.  . rosuvastatin (CRESTOR) 5 MG tablet Take 1 tablet (5 mg total) by mouth daily. (Patient not taking: Reported on 04/04/2020)  . [DISCONTINUED] latanoprost (XALATAN) 0.005 % ophthalmic solution  (Patient not taking: Reported on 04/03/2020)   No facility-administered encounter medications on file as of 04/04/2020.    ALLERGIES: No Known Allergies  VACCINATION STATUS: Immunization History  Administered Date(s) Administered  . Fluad Quad(high  Dose 65+) 02/14/2020  . Influenza, High Dose Seasonal PF 03/23/2018  . Influenza,inj,Quad PF,6+ Mos 01/30/2016, 01/29/2017, 01/28/2019  . Moderna SARS-COV2 Booster Vaccination 03/16/2020  . Moderna SARS-COVID-2 Vaccination 06/05/2019, 07/06/2019  . Pneumococcal Polysaccharide-23 01/28/2019    Diabetes He presents for his follow-up diabetic visit. He has type 2 diabetes mellitus. Onset time: He was diagnosed at approximate age of 39 years. His disease course has been stable. Pertinent negatives for hypoglycemia include no confusion, headaches, pallor or seizures. There are no diabetic associated symptoms. Pertinent negatives for diabetes include no chest pain, no fatigue, no polydipsia, no polyphagia, no polyuria and no weakness. There are no hypoglycemic complications. Symptoms are stable. Diabetic complications include nephropathy. Risk factors for coronary artery disease include dyslipidemia, diabetes mellitus, hypertension, male sex and sedentary lifestyle. Current diabetic treatment includes oral agent (monotherapy) and oral agent (dual therapy). He is compliant with treatment most of the time. His weight is fluctuating minimally. He is following a generally unhealthy diet. When asked about meal planning, he reported none. He has not had a previous visit with a dietitian. He rarely participates in exercise. His home blood glucose trend is fluctuating minimally. His overall blood glucose range is 140-180 mg/dl. (He presents today with his logs, no meter, showing stable glycemic profile with averages between 140-160.  His POCT A1c today is 7.6%, increasing from last visit of 6.8%, but still an reasonable goal A1c for his age.  There are no documented or reported episodes of hypoglycemia noted.) An ACE inhibitor/angiotensin II receptor blocker is being taken. He does not see a podiatrist.Eye exam is current.  Hyperlipidemia This is a chronic problem. The current episode started more than 1 year ago. The  problem is uncontrolled. Recent lipid tests were reviewed and are high. Exacerbating diseases include chronic renal disease and diabetes. Factors aggravating his hyperlipidemia include beta blockers. Pertinent negatives include no chest pain, myalgias or shortness of breath. Current antihyperlipidemic treatment includes statins. The current treatment provides mild improvement of lipids. There are no compliance problems.  Risk factors for coronary artery disease include dyslipidemia, diabetes mellitus, hypertension, male sex and a sedentary lifestyle.  Hypertension This is a chronic problem. The current episode started more than 1 year ago. The problem has been gradually improving since onset. The problem is controlled. Pertinent negatives include no chest pain, headaches, neck pain, palpitations or shortness of breath. There are no associated agents to hypertension. Risk factors for coronary artery disease include diabetes mellitus, dyslipidemia, male gender and sedentary lifestyle. Past treatments include ACE inhibitors, beta blockers and calcium channel blockers.  The current treatment provides significant improvement. There are no compliance problems.  Hypertensive end-organ damage includes kidney disease. Identifiable causes of hypertension include chronic renal disease.    Review of systems  Constitutional: + Minimally fluctuating body weight,  current Body mass index is 24.95 kg/m. , no fatigue, no subjective hyperthermia, no subjective hypothermia Eyes: no blurry vision, no xerophthalmia ENT: no sore throat, no nodules palpated in throat, no dysphagia/odynophagia, no hoarseness Cardiovascular: no chest pain, no shortness of breath, no palpitations, no leg swelling Respiratory: no cough, no shortness of breath Gastrointestinal: no nausea/vomiting/diarrhea Musculoskeletal: no muscle/joint aches Skin: no rashes, no hyperemia Neurological: no tremors, no numbness, no tingling, no  dizziness Psychiatric: no depression, no anxiety   Objective:    BP (!) 141/72 (BP Location: Left Arm)   Pulse (!) 55   Ht 6' (1.829 m)   Wt 184 lb (83.5 kg)   BMI 24.95 kg/m   Wt Readings from Last 3 Encounters:  04/04/20 184 lb (83.5 kg)  04/04/20 184 lb (83.5 kg)  04/03/20 182 lb (82.6 kg)    BP Readings from Last 3 Encounters:  04/04/20 (!) 141/72  04/03/20 118/60  02/14/20 (!) 100/51     Physical Exam- Limited  Constitutional:  Body mass index is 24.95 kg/m. , not in acute distress, normal state of mind Eyes:  EOMI, no exophthalmos Neck: Supple Thyroid: No gross goiter Cardiovascular: RRR, no murmers, rubs, or gallops, no edema Respiratory: Adequate breathing efforts, no crackles, rales, rhonchi, or wheezing Musculoskeletal: no gross deformities, strength intact in all four extremities, no gross restriction of joint movements Skin:  no rashes, no hyperemia Neurological: no tremor with outstretched hands   POCT ABI Results 04/04/20   Right ABI:  PAD      Left ABI:  1.19  Right leg systolic / diastolic: PAD mmHg Left leg systolic / diastolic: 915/05 mmHg  Arm systolic / diastolic: 697/94 mmHG  Detailed report will be scanned into patient chart.    CMP     Component Value Date/Time   NA 136 02/03/2020 1127   NA 140 05/14/2019 0000   K 4.8 02/03/2020 1127   CL 102 02/03/2020 1127   CO2 25 02/03/2020 1127   GLUCOSE 154 (H) 02/03/2020 1127   BUN 34 (H) 02/03/2020 1127   BUN 26 (A) 05/14/2019 0000   CREATININE 2.49 (H) 02/03/2020 1127   CALCIUM 9.1 02/03/2020 1127   PROT 7.6 02/03/2020 1127   ALBUMIN 4.2 02/03/2020 1127   AST 24 02/03/2020 1127   ALT 26 02/03/2020 1127   ALKPHOS 47 02/03/2020 1127   BILITOT 0.8 02/03/2020 1127   GFRNONAA 23 (L) 02/03/2020 1127   GFRAA 29 (L) 07/29/2019 1115    Diabetic Labs (most recent): Lab Results  Component Value Date   HGBA1C 7.6 (A) 04/04/2020   HGBA1C 6.8 (A) 11/30/2019   HGBA1C 7.3 (A) 08/23/2019      Lipid Panel ( most recent) Lipid Panel     Component Value Date/Time   CHOL 191 05/05/2017 0000   TRIG 106 05/05/2017 0000   HDL 53 05/05/2017 0000   LDLCALC 117 05/05/2017 0000      Lab Results  Component Value Date   TSH 1.48 05/05/2017      Assessment & Plan:   1) Type 2 diabetes mellitus with diabetic nephropathy, without long-term current use of insulin ([place])  - Aeneas Longsworth has currently uncontrolled symptomatic type 2 DM since 85 years of age.  He presents today with  his logs, no meter, showing stable glycemic profile with averages between 140-160.  His POCT A1c today is 7.6%, increasing from last visit of 6.8%, but still an reasonable goal A1c for his age.  There are no documented or reported episodes of hypoglycemia noted.  - Recent labs reviewed.  - I had a long discussion with him about the progressive nature of diabetes and the pathology behind its complications. -his diabetes is complicated by CKD and he remains at a high risk for more acute and chronic complications which include CAD, CVA, CKD, retinopathy, and neuropathy. These are all discussed in detail with him.  - Nutritional counseling repeated at each appointment due to patients tendency to fall back in to old habits.  - The patient admits there is a room for improvement in their diet and drink choices. -  Suggestion is made for the patient to avoid simple carbohydrates from their diet including Cakes, Sweet Desserts / Pastries, Ice Cream, Soda (diet and regular), Sweet Tea, Candies, Chips, Cookies, Sweet Pastries,  Store Bought Juices, Alcohol in Excess of  1-2 drinks a day, Artificial Sweeteners, Coffee Creamer, and "Sugar-free" Products. This will help patient to have stable blood glucose profile and potentially avoid unintended weight gain.   - I encouraged the patient to switch to  unprocessed or minimally processed complex starch and increased protein intake (animal or plant source), fruits,  and vegetables.   - Patient is advised to stick to a routine mealtimes to eat 3 meals  a day and avoid unnecessary snacks ( to snack only to correct hypoglycemia).  - he is scheduled with Jearld Fenton, RDN, CDE for diabetes education after his visit with me today.  - I have approached him with the following individualized plan to manage  his diabetes and patient agrees:   -Based on his stable glycemic profile, he will not need insulin treatment at this time.  He is advised to continue his Glipizide 5 mg XL p.o. daily with breakfast, and Januvia 50 mg p.o. daily.    -He is encouraged to continue monitoring blood glucose once daily, before breakfast and to call the clinic if he has readings less than 70 or greater than 200 for 3 tests in a row.  - he is not a candidate for metformin, SGLT2 inhibitors due to concurrent renal insufficiency.  - Specific targets for  A1c;  LDL, HDL,  and Triglycerides were discussed with the patient.  2) Blood Pressure /Hypertension:  His blood pressure is controlled to target for his age.  He is advised to continue Lisinopril 20 mg p.o. daily, Metoprolol 25 mg p.o. daily, and Amlodipine 10 mg p.o. daily.  3) Lipids/Hyperlipidemia:   His most recent lipid panel from 05/05/2017 shows uncontrolled LDL of 117.  He has run out of his cholesterol medication and has not gotten it refilled yet.  He sees his PCP next week to discuss.  Given his advanced age, he may not need further treatment.  4)  Weight/Diet: His Body mass index is 24.95 kg/m.  -  he is not a candidate for weight loss.  Exercise, and detailed carbohydrates information provided  -  detailed on discharge instructions.  5) Chronic Care/Health Maintenance: -he  is on ACEI/ARB and Statin medications and is encouraged to initiate and continue to follow up with Ophthalmology, Dentist, Nephrologist, and Podiatrist at least yearly or according to recommendations, and advised to stay away from smoking. I have  recommended yearly flu vaccine and pneumonia vaccine at least  every 5 years; moderate intensity exercise for up to 150 minutes weekly; and  sleep for at least 7 hours a day.  - he is  advised to maintain close follow up with Vivi Barrack, MD for primary care needs, as well as his other providers for optimal and coordinated care.  -He will be referred to [facility] for abnormal ABI results in office today.  - Time spent on this patient care encounter:  30 min, of which > 50% was spent in  counseling and the rest reviewing his blood glucose logs , discussing his hypoglycemia and hyperglycemia episodes, reviewing his current and  previous labs / studies  ( including abstraction from other facilities) and medications  doses and developing a  long term treatment plan and documenting his care.   Please refer to Patient Instructions for Blood Glucose Monitoring and Insulin/Medications Dosing Guide"  in media tab for additional information. Please  also refer to " Patient Self Inventory" in the Media  tab for reviewed elements of pertinent patient history.  Donne Hazel participated in the discussions, expressed understanding, and voiced agreement with the above plans.  All questions were answered to his satisfaction. he is encouraged to contact clinic should he have any questions or concerns prior to his return visit.   Follow up plan: - Return in about 6 months (around 10/03/2020) for Diabetes follow up with A1c in office, Bring glucometer and logs.  Rayetta Pigg, [facility] [address] [place], [place] [address] Phone: [phone] Fax: [phone]   04/04/2020, 10:32 AM

## 2020-04-04 NOTE — Patient Instructions (Signed)
Goals  Eat three meals per day Don't skip lunch

## 2020-04-04 NOTE — Patient Instructions (Signed)

## 2020-04-04 NOTE — Progress Notes (Signed)
  Medical Nutrition Therapy:  Appt start time: 1030  end time:1045  Assessment:  Primary concerns today: Diabetes Typd 2 x -10 yrs.  Sees Whitney Reardon,NP. A1C  7.6%. FBS:154 this am.  Usually 150-160's in am. BS at bedtime: 150's Going to [facility] three times per week. Taking Januvia daily and Glipizide   CMP Latest Ref Rng & Units 02/03/2020 07/29/2019 05/14/2019  Glucose 70 - 99 mg/dL 154(H) 168(H) -  BUN 8 - 23 mg/dL 34(H) 38(H) 26(A)  Creatinine 0.61 - 1.24 mg/dL 2.49(H) 2.31(H) 2.4(A)  Sodium 135 - 145 mmol/L 136 138 140  Potassium 3.5 - 5.1 mmol/L 4.8 4.8 4.0  Chloride 98 - 111 mmol/L 102 104 103  CO2 22 - 32 mmol/L $RemoveB'25 25 21  '[id number]$ Calcium 8.9 - 10.3 mg/dL 9.1 9.4 9.7  Total Protein 6.5 - 8.1 g/dL 7.6 7.8 -  Total Bilirubin 0.3 - 1.2 mg/dL 0.8 0.8 -  Alkaline Phos 38 - 126 U/L 47 53 -  AST 15 - 41 U/L 24 18 -  ALT 0 - 44 U/L 26 22 -   Lipid Panel     Component Value Date/Time   CHOL 191 05/05/2017 0000   TRIG 106 05/05/2017 0000   HDL 53 05/05/2017 0000   LDLCALC 117 05/05/2017 0000   Lab Results  Component Value Date   HGBA1C 7.6 (A) 04/04/2020    Preferred Learning Style:     No preference indicated   Learning Readiness:  Ready  Change in progress   MEDICATIONS:   DIETARY INTAKE:  24-hr recall:  B ( 9 ): Raisin bran with milk,  Snk ( AM):  L ( PM): skips  Snk ( PM): D ( PM) Snk ( PM):  Beverages: water   Usual physical activity: [employer] 3 times per week.  Estimated energy needs: 2200 calories 248 g carbohydrates 165 g protein 61 g fat  Progress Towards Goal(s):  In progress.   Nutritional Diagnosis:  NB-1.1 Food and nutrition-related knowledge deficit As related to DM and CKD.  As evidenced by A1C 6.9% and eGFR 36..    Intervention:  Nutrition and Diabetes education provided on My Plate, CHO counting, meal planning, portion sizes, timing of meals, avoiding snacks between meals unless having a low blood sugar, target ranges for A1C and blood sugars,  signs/symptoms and treatment of hyper/hypoglycemia, monitoring blood sugars, taking medications as prescribed, benefits of exercising 30 minutes per day and prevention of complications of DM. Low salt low cholesterol diet   .Goals  Keep up the great job!!  Eat three meals per day Don't skip lunch  Teaching Method Utilized:  Visual Auditory Hands on  Handouts given during visit include:  The Plate Method   Meal Plan Card    Barriers to learning/adherence to lifestyle change: none  Demonstrated degree of understanding via:  Teach Back   Monitoring/Evaluation:  Dietary intake, exercise, , and body weight in  3-4 month(s).

## 2020-04-06 ENCOUNTER — Other Ambulatory Visit: Payer: Self-pay

## 2020-04-06 ENCOUNTER — Ambulatory Visit (INDEPENDENT_AMBULATORY_CARE_PROVIDER_SITE_OTHER): Payer: Medicare Other | Admitting: Family Medicine

## 2020-04-06 ENCOUNTER — Encounter: Payer: Self-pay | Admitting: Family Medicine

## 2020-04-06 VITALS — BP 155/67 | HR 58 | Temp 97.2°F | Ht 72.0 in | Wt 185.4 lb

## 2020-04-06 DIAGNOSIS — E785 Hyperlipidemia, unspecified: Secondary | ICD-10-CM

## 2020-04-06 DIAGNOSIS — Z0001 Encounter for general adult medical examination with abnormal findings: Secondary | ICD-10-CM

## 2020-04-06 DIAGNOSIS — I10 Essential (primary) hypertension: Secondary | ICD-10-CM

## 2020-04-06 DIAGNOSIS — R350 Frequency of micturition: Secondary | ICD-10-CM

## 2020-04-06 DIAGNOSIS — E1121 Type 2 diabetes mellitus with diabetic nephropathy: Secondary | ICD-10-CM

## 2020-04-06 DIAGNOSIS — E1169 Type 2 diabetes mellitus with other specified complication: Secondary | ICD-10-CM

## 2020-04-06 DIAGNOSIS — E1159 Type 2 diabetes mellitus with other circulatory complications: Secondary | ICD-10-CM

## 2020-04-06 DIAGNOSIS — E663 Overweight: Secondary | ICD-10-CM

## 2020-04-06 DIAGNOSIS — C61 Malignant neoplasm of prostate: Secondary | ICD-10-CM

## 2020-04-06 DIAGNOSIS — Z23 Encounter for immunization: Secondary | ICD-10-CM | POA: Diagnosis not present

## 2020-04-06 DIAGNOSIS — I152 Hypertension secondary to endocrine disorders: Secondary | ICD-10-CM

## 2020-04-06 DIAGNOSIS — Z6825 Body mass index (BMI) 25.0-25.9, adult: Secondary | ICD-10-CM

## 2020-04-06 MED ORDER — MIRABEGRON ER 25 MG PO TB24: 25.0000 mg | ORAL_TABLET | Freq: Every day | ORAL | 5 refills | Status: DC

## 2020-04-06 NOTE — Addendum Note (Signed)
Addended by: Betti Cruz on: 04/06/2020 02:15 PM   Modules accepted: Orders

## 2020-04-06 NOTE — Assessment & Plan Note (Signed)
Slightly above goal today though is typically been well controlled.  Will continue current medications continue home monitoring goal 150/90 or lower.

## 2020-04-06 NOTE — Progress Notes (Signed)
Chief Complaint:  Richard Swanson is a 85 y.o. male who presents today for his annual comprehensive physical exam.    Assessment/Plan:  New/Acute Problems: Urinary Frequency Will check UA and urine culture.  Possibly overactive bladder related to history of radiation for prostate cancer.  Patient has no prostate-doubt this is BPH.  Will start Myrbetriq.  Discussed potential side effects.  He will also follow-up with urology.  Chronic Problems Addressed Today: Dyslipidemia due to type 2 diabetes mellitus (HCC) Check lipids.  He has not been taking Crestor 5 mg daily though will likely need to restart depending on lipid levels.  Hypertension associated with diabetes ([place]) Slightly above goal today though is typically been well controlled.  Will continue current medications continue home monitoring goal 150/90 or lower.  Type 2 diabetes mellitus with diabetic nephropathy, without long-term current use of insulin ([place]) Continue management per endocrinology.  Prostate cancer [facility]) Continue management per urology.   Body mass index is 25.14 kg/m. / Overweight  BMI Metric Follow Up - 04/06/20 1203      BMI Metric Follow Up-Please document annually   BMI Metric Follow Up Education provided            Preventative Healthcare: Check CBC, CMET, TSH, lipid panel.  Prevnar given today.  Up-to-date on other vaccines and screenings.  Patient Counseling(The following topics were reviewed and/or handout was given):  -Nutrition: Stressed importance of moderation in sodium/caffeine intake, saturated fat and cholesterol, caloric balance, sufficient intake of fresh fruits, vegetables, and fiber.  -Stressed the importance of regular exercise.   -Substance Abuse: Discussed cessation/primary prevention of tobacco, alcohol, or other drug use; driving or other dangerous activities under the influence; availability of treatment for abuse.   -Injury prevention: Discussed safety belts, safety  helmets, smoke detector, smoking near bedding or upholstery.   -Sexuality: Discussed sexually transmitted diseases, partner selection, use of condoms, avoidance of unintended pregnancy and contraceptive alternatives.   -Dental health: Discussed importance of regular tooth brushing, flossing, and dental visits.  -Health maintenance and immunizations reviewed. Please refer to Health maintenance section.  Return to care in 1 year for next preventative visit.     Subjective:  HPI:  He has no acute complaints today.   He has had more issue with urinary frequency for the past several weeks to months.  Wakes up 2-3 times at night to urinate.  Has had prostatectomy with radiation therapy.  No dysuria.  No hematuria.  No fevers or chills.  Lifestyle Diet: Balanced. Plenty of fruits and vegetables.  Exercise: Exercises 3 times per week.   Depression screen PHQ 2/9 04/03/2020  Decreased Interest 0  Down, Depressed, Hopeless 0  PHQ - 2 Score 0    Health Maintenance Due  Topic Date Due  . OPHTHALMOLOGY EXAM  Never done  . PNA vac Low Risk Adult (2 of 2 - PCV13) 01/28/2020     ROS: Per HPI, otherwise a complete review of systems was negative.   PMH:  The following were reviewed and entered/updated in epic: Past Medical History:  Diagnosis Date  . Cancer [facility])    Prostate  . Chronic obstructive pulmonary disease, unspecified ([place])   . Diabetes mellitus without complication ([place])   . Dizziness   . Gastro-esophageal reflux disease without esophagitis   . Glaucoma   . Glomerular disorders in diseases classified elsewhere   . Gross hematuria   . Hypertension   . Hypertensive chronic kidney disease with stage 1 through stage 4 chronic  kidney disease, or unspecified chronic kidney disease   . Kidney disease, chronic, stage III (moderate, EGFR 30-59 ml/min) (HCC)   . Malignant neoplasm of prostate ([place])   . Prostate cancer ([place]) 11/03/2010   dx 2006  . Type 2 diabetes mellitus with  diabetic nephropathy ([place])   . Unspecified kidney failure    Patient Active Problem List   Diagnosis Date Noted  . Constipation 11/16/2019  . Tinnitus 11/16/2019  . Dyslipidemia due to type 2 diabetes mellitus ([place]) 08/23/2019  . Allergic rhinitis due to pollen 08/19/2019  . Hypertension associated with diabetes ([place]) 05/10/2019  . GERD (gastroesophageal reflux disease)   . Glaucoma   . Stage 3 chronic kidney disease ([place])   . Glomerular disorders in diseases classified elsewhere   . Type 2 diabetes mellitus with diabetic nephropathy, without long-term current use of insulin ([place])   . Anemia in chronic kidney disease 07/31/2015  . Prostate cancer ([place]) 11/03/2010   Past Surgical History:  Procedure Laterality Date  . PROSTATE SURGERY      Family History  Problem Relation Age of Onset  . Stroke Father     Medications- reviewed and updated Current Outpatient Medications  Medication Sig Dispense Refill  . amLODipine (NORVASC) 10 MG tablet Take 1 tablet (10 mg total) by mouth daily. 90 tablet 3  . GLIPIZIDE XL 5 MG 24 hr tablet TAKE ONE TABLET BY MOUTH ONCE DAILY WITH BREAKFAST. 90 tablet 0  . lisinopril (ZESTRIL) 20 MG tablet Take 1 tablet (20 mg total) by mouth daily. 90 tablet 0  . metoprolol succinate (TOPROL-XL) 25 MG 24 hr tablet Take 1 tablet (25 mg total) by mouth daily. 90 tablet 3  . rosuvastatin (CRESTOR) 5 MG tablet Take 1 tablet (5 mg total) by mouth daily. 90 tablet 0  . sitaGLIPtin (JANUVIA) 50 MG tablet Take 1 tablet (50 mg total) by mouth daily. 30 tablet 0  . VYZULTA 0.024 % SOLN Apply 1 drop to eye at bedtime.    . mirabegron ER (MYRBETRIQ) 25 MG TB24 tablet Take 1 tablet (25 mg total) by mouth daily. 30 tablet 5   No current facility-administered medications for this visit.    Allergies-reviewed and updated No Known Allergies  Social History   Socioeconomic History  . Marital status: Married    Spouse name: Not on file  . Number of children: Not on  file  . Years of education: Not on file  . Highest education level: Not on file  Occupational History  . Occupation: Retired  Tobacco Use  . Smoking status: Never Smoker  . Smokeless tobacco: Never Used  Vaping Use  . Vaping Use: Never used  Substance and Sexual Activity  . Alcohol use: No  . Drug use: No  . Sexual activity: Not on file  Other Topics Concern  . Not on file  Social History Narrative  . Not on file   Social Determinants of Health   Financial Resource Strain: Low Risk   . Difficulty of Paying Living Expenses: Not hard at all  Food Insecurity: No Food Insecurity  . Worried About Charity fundraiser in the Last Year: Never true  . Ran Out of Food in the Last Year: Never true  Transportation Needs: No Transportation Needs  . Lack of Transportation (Medical): No  . Lack of Transportation (Non-Medical): No  Physical Activity: Sufficiently Active  . Days of Exercise per Week: 3 days  . Minutes of Exercise per Session: 60 min  Stress:  No Stress Concern Present  . Feeling of Stress : Not at all  Social Connections: Socially Integrated  . Frequency of Communication with Friends and Family: Twice a week  . Frequency of Social Gatherings with Friends and Family: More than three times a week  . Attends Religious Services: More than 4 times per year  . Active Member of Clubs or Organizations: Yes  . Attends Archivist Meetings: 1 to 4 times per year  . Marital Status: Married        Objective:  Physical Exam: BP (!) 155/67   Pulse (!) 58   Temp (!) 97.2 F (36.2 C) (Temporal)   Ht 6' (1.829 m)   Wt 185 lb 6.4 oz (84.1 kg)   SpO2 100%   BMI 25.14 kg/m   Body mass index is 25.14 kg/m. Wt Readings from Last 3 Encounters:  04/06/20 185 lb 6.4 oz (84.1 kg)  04/04/20 184 lb (83.5 kg)  04/04/20 184 lb (83.5 kg)   Gen: NAD, resting comfortably HEENT: TMs normal bilaterally. OP clear. No thyromegaly noted.  CV: RRR with no murmurs appreciated Pulm:  NWOB, CTAB with no crackles, wheezes, or rhonchi GI: Normal bowel sounds present. Soft, Nontender, Nondistended. MSK: no edema, cyanosis, or clubbing noted Skin: warm, dry Neuro: CN2-12 grossly intact. Strength 5/5 in upper and lower extremities. Reflexes symmetric and intact bilaterally.  Psych: Normal affect and thought content     Shanasia Ibrahim M. Jerline Pain, MD 04/06/2020 12:03 PM

## 2020-04-06 NOTE — Assessment & Plan Note (Signed)
Continue management per urology. 

## 2020-04-06 NOTE — Assessment & Plan Note (Signed)
Continue management per endocrinology. 

## 2020-04-06 NOTE — Patient Instructions (Signed)
It was very nice to see you today!  We will check blood work today.  We will also get your pneumonia vaccine.  Please try the Myrbetriq for urination.  Let me know if not improving.  I will see back in year for your next checkup.  Please back to see me sooner if needed.  Take care, Dr Jerline Pain  Please try these tips to maintain a healthy lifestyle:   Eat at least 3 REAL meals and 1-2 snacks per day.  Aim for no more than 5 hours between eating.  If you eat breakfast, please do so within one hour of getting up.    Each meal should contain half fruits/vegetables, one quarter protein, and one quarter carbs (no bigger than a computer mouse)   Cut down on sweet beverages. This includes juice, soda, and sweet tea.     Drink at least 1 glass of water with each meal and aim for at least 8 glasses per day   Exercise at least 150 minutes every week.    Preventive Care 68 Years and Older, Male Preventive care refers to lifestyle choices and visits with your health care provider that can promote health and wellness. This includes:  A yearly physical exam. This is also called an annual well check.  Regular dental and eye exams.  Immunizations.  Screening for certain conditions.  Healthy lifestyle choices, such as diet and exercise. What can I expect for my preventive care visit? Physical exam Your health care provider will check:  Height and weight. These may be used to calculate body mass index (BMI), which is a measurement that tells if you are at a healthy weight.  Heart rate and blood pressure.  Your skin for abnormal spots. Counseling Your health care provider may ask you questions about:  Alcohol, tobacco, and drug use.  Emotional well-being.  Home and relationship well-being.  Sexual activity.  Eating habits.  History of falls.  Memory and ability to understand (cognition).  Work and work Statistician. What immunizations do I need?  Influenza (flu)  vaccine  This is recommended every year. Tetanus, diphtheria, and pertussis (Tdap) vaccine  You may need a Td booster every 10 years. Varicella (chickenpox) vaccine  You may need this vaccine if you have not already been vaccinated. Zoster (shingles) vaccine  You may need this after age 26. Pneumococcal conjugate (PCV13) vaccine  One dose is recommended after age 71. Pneumococcal polysaccharide (PPSV23) vaccine  One dose is recommended after age 39. Measles, mumps, and rubella (MMR) vaccine  You may need at least one dose of MMR if you were born in 1957 or later. You may also need a second dose. Meningococcal conjugate (MenACWY) vaccine  You may need this if you have certain conditions. Hepatitis A vaccine  You may need this if you have certain conditions or if you travel or work in places where you may be exposed to hepatitis A. Hepatitis B vaccine  You may need this if you have certain conditions or if you travel or work in places where you may be exposed to hepatitis B. Haemophilus influenzae type b (Hib) vaccine  You may need this if you have certain conditions. You may receive vaccines as individual doses or as more than one vaccine together in one shot (combination vaccines). Talk with your health care provider about the risks and benefits of combination vaccines. What tests do I need? Blood tests  Lipid and cholesterol levels. These may be checked every 5 years, or  more frequently depending on your overall health.  Hepatitis C test.  Hepatitis B test. Screening  Lung cancer screening. You may have this screening every year starting at age 70 if you have a 30-pack-year history of smoking and currently smoke or have quit within the past 15 years.  Colorectal cancer screening. All adults should have this screening starting at age 75 and continuing until age 58. Your health care provider may recommend screening at age 57 if you are at increased risk. You will have  tests every 1-10 years, depending on your results and the type of screening test.  Prostate cancer screening. Recommendations will vary depending on your family history and other risks.  Diabetes screening. This is done by checking your blood sugar (glucose) after you have not eaten for a while (fasting). You may have this done every 1-3 years.  Abdominal aortic aneurysm (AAA) screening. You may need this if you are a current or former smoker.  Sexually transmitted disease (STD) testing. Follow these instructions at home: Eating and drinking  Eat a diet that includes fresh fruits and vegetables, whole grains, lean protein, and low-fat dairy products. Limit your intake of foods with high amounts of sugar, saturated fats, and salt.  Take vitamin and mineral supplements as recommended by your health care provider.  Do not drink alcohol if your health care provider tells you not to drink.  If you drink alcohol: ? Limit how much you have to 0-2 drinks a day. ? Be aware of how much alcohol is in your drink. In the [place] one drink equals one 12 oz bottle of beer (355 mL), one 5 oz glass of wine (148 mL), or one 1 oz glass of hard liquor (44 mL). Lifestyle  Take daily care of your teeth and gums.  Stay active. Exercise for at least 30 minutes on 5 or more days each week.  Do not use any products that contain nicotine or tobacco, such as cigarettes, e-cigarettes, and chewing tobacco. If you need help quitting, ask your health care provider.  If you are sexually active, practice safe sex. Use a condom or other form of protection to prevent STIs (sexually transmitted infections).  Talk with your health care provider about taking a low-dose aspirin or statin. What's next?  Visit your health care provider once a year for a well check visit.  Ask your health care provider how often you should have your eyes and teeth checked.  Stay up to date on all vaccines. This information is not  intended to replace advice given to you by your health care provider. Make sure you discuss any questions you have with your health care provider. Document Revised: 04/09/2018 Document Reviewed: 04/09/2018 Elsevier Patient Education  2020 [employer].

## 2020-04-06 NOTE — Assessment & Plan Note (Signed)
Check lipids.  He has not been taking Crestor 5 mg daily though will likely need to restart depending on lipid levels.

## 2020-04-07 LAB — LIPID PANEL
Cholesterol: 120 mg/dL (ref <200)
HDL: 43 mg/dL (ref >=40)
LDL Cholesterol (Calc): 59 mg/dL (calc)
Non-HDL Cholesterol (Calc): 77 mg/dL (calc) (ref <130)
Total CHOL/HDL Ratio: 2.8 (calc) (ref <5.0)
Triglycerides: 101 mg/dL (ref <150)

## 2020-04-07 LAB — CBC
HCT: 34.9 % — ABNORMAL LOW (ref 38.5–50.0)
Hemoglobin: 11.8 g/dL — ABNORMAL LOW (ref 13.2–17.1)
MCH: 27.5 pg (ref 27.0–33.0)
MCHC: 33.8 g/dL (ref 32.0–36.0)
MCV: 81.4 fL (ref 80.0–100.0)
MPV: 10.9 fL (ref 7.5–12.5)
Platelets: 147 10*3/uL (ref 140–400)
RBC: 4.29 10*6/uL (ref 4.20–5.80)
RDW: 13.7 % (ref 11.0–15.0)
WBC: 5.2 10*3/uL (ref 3.8–10.8)

## 2020-04-07 LAB — COMPREHENSIVE METABOLIC PANEL
AG Ratio: 1.4 (calc) (ref 1.0–2.5)
ALT: 23 U/L (ref 9–46)
AST: 19 U/L (ref 10–35)
Albumin: 4.1 g/dL (ref 3.6–5.1)
Alkaline phosphatase (APISO): 59 U/L (ref 35–144)
BUN/Creatinine Ratio: 12 (calc) (ref 6–22)
BUN: 27 mg/dL — ABNORMAL HIGH (ref 7–25)
CO2: 28 mmol/L (ref 20–32)
Calcium: 9.2 mg/dL (ref 8.6–10.3)
Chloride: 105 mmol/L (ref 98–110)
Creat: 2.23 mg/dL — ABNORMAL HIGH (ref 0.70–1.11)
Globulin: 2.9 g/dL (calc) (ref 1.9–3.7)
Glucose, Bld: 210 mg/dL — ABNORMAL HIGH (ref 65–99)
Potassium: 4.4 mmol/L (ref 3.5–5.3)
Sodium: 139 mmol/L (ref 135–146)
Total Bilirubin: 0.6 mg/dL (ref 0.2–1.2)
Total Protein: 7 g/dL (ref 6.1–8.1)

## 2020-04-07 LAB — TSH: TSH: 0.49 mIU/L (ref 0.40–4.50)

## 2020-04-08 LAB — URINALYSIS, ROUTINE W REFLEX MICROSCOPIC
Bilirubin Urine: NEGATIVE
Hyaline Cast: NONE SEEN /LPF
Ketones, ur: NEGATIVE
Nitrite: NEGATIVE
Specific Gravity, Urine: 1.016 (ref 1.001–1.03)
Squamous Epithelial / HPF: NONE SEEN /HPF (ref <=5)
WBC, UA: >=60 /HPF — AB (ref 0–5)
pH: 5.5 (ref 5.0–8.0)

## 2020-04-08 LAB — URINE CULTURE
MICRO NUMBER:: 11298091
SPECIMEN QUALITY:: ADEQUATE

## 2020-04-10 ENCOUNTER — Other Ambulatory Visit: Payer: Self-pay | Admitting: *Deleted

## 2020-04-10 MED ORDER — NITROFURANTOIN MONOHYD MACRO 100 MG PO CAPS: 100.0000 mg | ORAL_CAPSULE | Freq: Two times a day (BID) | ORAL | 0 refills | Status: AC

## 2020-04-10 NOTE — Progress Notes (Signed)
Please inform patient of the following:  He has a UTI. Recommend treating with macrobid 100mg  bid for 7 days. The rest of his labs are all STABLE. We can recheck in a year.  Would like for him to let us know if urinary symptoms do not improve with treatment.  Richard Swanson. Jerline Pain, MD 04/10/2020 1:12 PM

## 2020-04-12 DIAGNOSIS — E1129 Type 2 diabetes mellitus with other diabetic kidney complication: Secondary | ICD-10-CM | POA: Diagnosis not present

## 2020-04-12 DIAGNOSIS — N189 Chronic kidney disease, unspecified: Secondary | ICD-10-CM | POA: Diagnosis not present

## 2020-04-12 DIAGNOSIS — E1122 Type 2 diabetes mellitus with diabetic chronic kidney disease: Secondary | ICD-10-CM | POA: Diagnosis not present

## 2020-04-12 DIAGNOSIS — R809 Proteinuria, unspecified: Secondary | ICD-10-CM | POA: Diagnosis not present

## 2020-04-12 DIAGNOSIS — D631 Anemia in chronic kidney disease: Secondary | ICD-10-CM | POA: Diagnosis not present

## 2020-04-13 ENCOUNTER — Encounter: Payer: Self-pay | Admitting: Nutrition

## 2020-04-17 DIAGNOSIS — R809 Proteinuria, unspecified: Secondary | ICD-10-CM | POA: Diagnosis not present

## 2020-04-17 DIAGNOSIS — E875 Hyperkalemia: Secondary | ICD-10-CM | POA: Diagnosis not present

## 2020-04-17 DIAGNOSIS — I129 Hypertensive chronic kidney disease with stage 1 through stage 4 chronic kidney disease, or unspecified chronic kidney disease: Secondary | ICD-10-CM | POA: Diagnosis not present

## 2020-04-17 DIAGNOSIS — D631 Anemia in chronic kidney disease: Secondary | ICD-10-CM | POA: Diagnosis not present

## 2020-04-17 DIAGNOSIS — N189 Chronic kidney disease, unspecified: Secondary | ICD-10-CM | POA: Diagnosis not present

## 2020-05-12 ENCOUNTER — Other Ambulatory Visit: Payer: Self-pay | Admitting: "Endocrinology

## 2020-05-16 ENCOUNTER — Other Ambulatory Visit: Payer: Self-pay | Admitting: *Deleted

## 2020-05-16 DIAGNOSIS — M79606 Pain in leg, unspecified: Secondary | ICD-10-CM

## 2020-05-22 ENCOUNTER — Other Ambulatory Visit: Payer: Self-pay

## 2020-05-22 ENCOUNTER — Encounter: Payer: Self-pay | Admitting: Vascular Surgery

## 2020-05-22 ENCOUNTER — Ambulatory Visit: Payer: Medicare Other | Admitting: Vascular Surgery

## 2020-05-22 ENCOUNTER — Ambulatory Visit (INDEPENDENT_AMBULATORY_CARE_PROVIDER_SITE_OTHER): Payer: Medicare Other

## 2020-05-22 VITALS — BP 155/65 | HR 52 | Temp 97.7°F | Resp 16 | Ht 72.0 in | Wt 187.0 lb

## 2020-05-22 DIAGNOSIS — I739 Peripheral vascular disease, unspecified: Secondary | ICD-10-CM | POA: Diagnosis not present

## 2020-05-22 DIAGNOSIS — M79606 Pain in leg, unspecified: Secondary | ICD-10-CM | POA: Diagnosis not present

## 2020-05-22 NOTE — Progress Notes (Signed)
[facility] [place]  Patient name: Richard Swanson MRN: [id number] DOB: 02-22-1935 Sex: male  REASON FOR CONSULT: Abnormal screening study lower extremity arterial flow  HPI: Richard Swanson is a 86 y.o. male, here today for evaluation of lower extremity arterial flow.  He is a very pleasant active gentleman.  He denies any claudication symptoms and denies any lower extremity rest pain or tissue loss.  He was found on screening studies to have abnormal arterial flow and is seen today for further evaluation.  He has no history of coronary disease.  No history of stroke.  Does have longstanding history of moderate renal insufficiency felt to be related to diabetes and hypertension.  Does have history of prostate cancer  Past Medical History:  Diagnosis Date  . Cancer [id number])    Prostate  . Chronic obstructive pulmonary disease, unspecified ([place])   . Diabetes mellitus without complication ([place])   . Dizziness   . Gastro-esophageal reflux disease without esophagitis   . Glaucoma   . Glomerular disorders in diseases classified elsewhere   . Gross hematuria   . Hypertension   . Hypertensive chronic kidney disease with stage 1 through stage 4 chronic kidney disease, or unspecified chronic kidney disease   . Kidney disease, chronic, stage III (moderate, EGFR 30-59 ml/min) (HCC)   . Malignant neoplasm of prostate ([place])   . Prostate cancer ([place]) 11/03/2010   dx 2006  . Type 2 diabetes mellitus with diabetic nephropathy ([place])   . Unspecified kidney failure     Family History  Problem Relation Age of Onset  . Stroke Father     SOCIAL HISTORY: Social History   Socioeconomic History  . Marital status: Married    Spouse name: Not on file  . Number of children: Not on file  . Years of education: Not on file  . Highest education level: Not on file  Occupational History  . Occupation: Retired  Tobacco Use  . Smoking status: Never Smoker  . Smokeless tobacco:  Never Used  Vaping Use  . Vaping Use: Never used  Substance and Sexual Activity  . Alcohol use: No  . Drug use: No  . Sexual activity: Not on file  Other Topics Concern  . Not on file  Social History Narrative  . Not on file   Social Determinants of Health   Financial Resource Strain: Low Risk   . Difficulty of Paying Living Expenses: Not hard at all  Food Insecurity: No Food Insecurity  . Worried About Charity fundraiser in the Last Year: Never true  . Ran Out of Food in the Last Year: Never true  Transportation Needs: No Transportation Needs  . Lack of Transportation (Medical): No  . Lack of Transportation (Non-Medical): No  Physical Activity: Sufficiently Active  . Days of Exercise per Week: 3 days  . Minutes of Exercise per Session: 60 min  Stress: No Stress Concern Present  . Feeling of Stress : Not at all  Social Connections: Socially Integrated  . Frequency of Communication with Friends and Family: Twice a week  . Frequency of Social Gatherings with Friends and Family: More than three times a week  . Attends Religious Services: More than 4 times per year  . Active Member of Clubs or Organizations: Yes  . Attends Archivist Meetings: 1 to 4 times per year  . Marital Status: Married  Human resources officer Violence: Not At Risk  . Fear of Current or Ex-Partner:  No  . Emotionally Abused: No  . Physically Abused: No  . Sexually Abused: No    No Known Allergies  Current Outpatient Medications  Medication Sig Dispense Refill  . amLODipine (NORVASC) 10 MG tablet Take 1 tablet (10 mg total) by mouth daily. 90 tablet 3  . GLIPIZIDE XL 5 MG 24 hr tablet TAKE ONE TABLET BY MOUTH ONCE DAILY WITH BREAKFAST. 90 tablet 0  . JANUVIA 50 MG tablet TAKE ONE TABLET BY MOUTH ONCE DAILY. 90 tablet 0  . lisinopril (ZESTRIL) 20 MG tablet Take 1 tablet (20 mg total) by mouth daily. 90 tablet 0  . metoprolol succinate (TOPROL-XL) 25 MG 24 hr tablet Take 1 tablet (25 mg total) by  mouth daily. 90 tablet 3  . mirabegron ER (MYRBETRIQ) 25 MG TB24 tablet Take 1 tablet (25 mg total) by mouth daily. 30 tablet 5  . rosuvastatin (CRESTOR) 5 MG tablet Take 1 tablet (5 mg total) by mouth daily. 90 tablet 0  . VYZULTA 0.024 % SOLN Apply 1 drop to eye at bedtime.     No current facility-administered medications for this visit.    REVIEW OF SYSTEMS:  [X]  denotes positive finding, [ ]  denotes negative finding Cardiac  Comments:  Chest pain or chest pressure:    Shortness of breath upon exertion:    Short of breath when lying flat:    Irregular heart rhythm:        Vascular    Pain in calf, thigh, or hip brought on by ambulation:    Pain in feet at night that wakes you up from your sleep:     Blood clot in your veins:    Leg swelling:         Pulmonary    Oxygen at home:    Productive cough:     Wheezing:         Neurologic    Sudden weakness in arms or legs:     Sudden numbness in arms or legs:     Sudden onset of difficulty speaking or slurred speech:    Temporary loss of vision in one eye:     Problems with dizziness:         Gastrointestinal    Blood in stool:     Vomited blood:         Genitourinary    Burning when urinating:     Blood in urine:        Psychiatric    Major depression:         Hematologic    Bleeding problems:    Problems with blood clotting too easily:        Skin    Rashes or ulcers:        Constitutional    Fever or chills:      PHYSICAL EXAM: Vitals:   05/22/20 1441  BP: (!) 155/65  Pulse: (!) 52  Resp: 16  Temp: 97.7 F (36.5 C)  TempSrc: Other (Comment)  SpO2: 99%  Weight: 187 lb (84.8 kg)  Height: 6' (1.829 m)    GENERAL: The patient is a well-nourished male, in no acute distress. The vital signs are documented above. VASCULAR: Carotid arteries without bruits bilaterally.  2+ radial pulses bilaterally.  2+ femoral and 2+ popliteal pulses bilaterally.  I do not palpate pedal pulses. PULMONARY: There is good  air exchange ABDOMEN: Soft and non-tender  MUSCULOSKELETAL: There are no major deformities or cyanosis. NEUROLOGIC: No focal weakness or paresthesias are  detected. SKIN: There are no ulcers or rashes noted. PSYCHIATRIC: The patient has a normal affect.  DATA:   Noninvasive studies reveal normal ankle arm index on the left and mildly diminished on the right at 0.80.  MEDICAL ISSUES:  Discussed these findings at length with the patient.  He does not have any evidence of significant arterial insufficiency.  Does have evidence of mild tibial disease with normal popliteal pulses bilaterally.  He walks on the treadmill 3 times a week and I have encouraged him to continue this.  He will continue to keep an eye on his feet and bring attention should he develop any wound issues.  Otherwise he will see [place] again on an as-needed basis   Rosetta Posner, MD [facility] [place] Office phone [phone]

## 2020-05-30 ENCOUNTER — Other Ambulatory Visit: Payer: Self-pay | Admitting: "Endocrinology

## 2020-06-05 DIAGNOSIS — H401132 Primary open-angle glaucoma, bilateral, moderate stage: Secondary | ICD-10-CM | POA: Diagnosis not present

## 2020-06-05 DIAGNOSIS — Z961 Presence of intraocular lens: Secondary | ICD-10-CM | POA: Diagnosis not present

## 2020-06-05 DIAGNOSIS — H26493 Other secondary cataract, bilateral: Secondary | ICD-10-CM | POA: Diagnosis not present

## 2020-06-19 DIAGNOSIS — R809 Proteinuria, unspecified: Secondary | ICD-10-CM | POA: Diagnosis not present

## 2020-06-19 DIAGNOSIS — D631 Anemia in chronic kidney disease: Secondary | ICD-10-CM | POA: Diagnosis not present

## 2020-06-19 DIAGNOSIS — E1129 Type 2 diabetes mellitus with other diabetic kidney complication: Secondary | ICD-10-CM | POA: Diagnosis not present

## 2020-06-19 DIAGNOSIS — E1122 Type 2 diabetes mellitus with diabetic chronic kidney disease: Secondary | ICD-10-CM | POA: Diagnosis not present

## 2020-06-19 DIAGNOSIS — N189 Chronic kidney disease, unspecified: Secondary | ICD-10-CM | POA: Diagnosis not present

## 2020-06-23 DIAGNOSIS — N189 Chronic kidney disease, unspecified: Secondary | ICD-10-CM | POA: Diagnosis not present

## 2020-06-23 DIAGNOSIS — E1122 Type 2 diabetes mellitus with diabetic chronic kidney disease: Secondary | ICD-10-CM | POA: Diagnosis not present

## 2020-06-23 DIAGNOSIS — D631 Anemia in chronic kidney disease: Secondary | ICD-10-CM | POA: Diagnosis not present

## 2020-06-23 DIAGNOSIS — I129 Hypertensive chronic kidney disease with stage 1 through stage 4 chronic kidney disease, or unspecified chronic kidney disease: Secondary | ICD-10-CM | POA: Diagnosis not present

## 2020-06-23 DIAGNOSIS — R809 Proteinuria, unspecified: Secondary | ICD-10-CM | POA: Diagnosis not present

## 2020-06-23 DIAGNOSIS — D696 Thrombocytopenia, unspecified: Secondary | ICD-10-CM | POA: Diagnosis not present

## 2020-06-23 DIAGNOSIS — E1129 Type 2 diabetes mellitus with other diabetic kidney complication: Secondary | ICD-10-CM | POA: Diagnosis not present

## 2020-06-26 ENCOUNTER — Other Ambulatory Visit: Payer: Self-pay | Admitting: "Endocrinology

## 2020-06-26 DIAGNOSIS — I152 Hypertension secondary to endocrine disorders: Secondary | ICD-10-CM

## 2020-06-26 DIAGNOSIS — E782 Mixed hyperlipidemia: Secondary | ICD-10-CM

## 2020-06-26 DIAGNOSIS — E1159 Type 2 diabetes mellitus with other circulatory complications: Secondary | ICD-10-CM

## 2020-06-26 NOTE — Telephone Encounter (Signed)
Please advise 

## 2020-08-14 ENCOUNTER — Other Ambulatory Visit: Payer: Self-pay

## 2020-08-14 ENCOUNTER — Other Ambulatory Visit: Payer: Self-pay | Admitting: Nurse Practitioner

## 2020-08-14 ENCOUNTER — Inpatient Hospital Stay (HOSPITAL_COMMUNITY): Payer: Medicare Other | Attending: Hematology

## 2020-08-14 DIAGNOSIS — C61 Malignant neoplasm of prostate: Secondary | ICD-10-CM | POA: Diagnosis present

## 2020-08-14 DIAGNOSIS — D631 Anemia in chronic kidney disease: Secondary | ICD-10-CM | POA: Insufficient documentation

## 2020-08-14 DIAGNOSIS — Z923 Personal history of irradiation: Secondary | ICD-10-CM | POA: Diagnosis not present

## 2020-08-14 DIAGNOSIS — N189 Chronic kidney disease, unspecified: Secondary | ICD-10-CM | POA: Insufficient documentation

## 2020-08-14 LAB — IRON AND TIBC
Iron: 80 ug/dL (ref 45–182)
Saturation Ratios: 25 % (ref 17.9–39.5)
TIBC: 318 ug/dL (ref 250–450)
UIBC: 238 ug/dL

## 2020-08-14 LAB — CBC WITH DIFFERENTIAL/PLATELET
Abs Immature Granulocytes: 0.06 10*3/uL (ref 0.00–0.07)
Basophils Absolute: 0 10*3/uL (ref 0.0–0.1)
Basophils Relative: 1 %
Eosinophils Absolute: 0.2 10*3/uL (ref 0.0–0.5)
Eosinophils Relative: 3 %
HCT: 38.4 % — ABNORMAL LOW (ref 39.0–52.0)
Hemoglobin: 13.1 g/dL (ref 13.0–17.0)
Immature Granulocytes: 1 %
Lymphocytes Relative: 23 %
Lymphs Abs: 1.2 10*3/uL (ref 0.7–4.0)
MCH: 26.8 pg (ref 26.0–34.0)
MCHC: 34.1 g/dL (ref 30.0–36.0)
MCV: 78.7 fL — ABNORMAL LOW (ref 80.0–100.0)
Monocytes Absolute: 0.5 10*3/uL (ref 0.1–1.0)
Monocytes Relative: 10 %
Neutro Abs: 3.2 10*3/uL (ref 1.7–7.7)
Neutrophils Relative %: 62 %
Platelets: 154 10*3/uL (ref 150–400)
RBC: 4.88 MIL/uL (ref 4.22–5.81)
RDW: 13.1 % (ref 11.5–15.5)
WBC: 5.2 10*3/uL (ref 4.0–10.5)
nRBC: 0 % (ref 0.0–0.2)

## 2020-08-14 LAB — FOLATE: Folate: 13.7 ng/mL (ref >5.9)

## 2020-08-14 LAB — COMPREHENSIVE METABOLIC PANEL
ALT: 24 U/L (ref 0–44)
AST: 23 U/L (ref 15–41)
Albumin: 4.3 g/dL (ref 3.5–5.0)
Alkaline Phosphatase: 48 U/L (ref 38–126)
Anion gap: 8 (ref 5–15)
BUN: 25 mg/dL — ABNORMAL HIGH (ref 8–23)
CO2: 26 mmol/L (ref 22–32)
Calcium: 9.1 mg/dL (ref 8.9–10.3)
Chloride: 103 mmol/L (ref 98–111)
Creatinine, Ser: 2.12 mg/dL — ABNORMAL HIGH (ref 0.61–1.24)
GFR, Estimated: 30 mL/min — ABNORMAL LOW (ref >60)
Glucose, Bld: 169 mg/dL — ABNORMAL HIGH (ref 70–99)
Potassium: 4.5 mmol/L (ref 3.5–5.1)
Sodium: 137 mmol/L (ref 135–145)
Total Bilirubin: 0.6 mg/dL (ref 0.3–1.2)
Total Protein: 8 g/dL (ref 6.5–8.1)

## 2020-08-14 LAB — LACTATE DEHYDROGENASE: LDH: 206 U/L — ABNORMAL HIGH (ref 98–192)

## 2020-08-14 LAB — VITAMIN B12: Vitamin B-12: 665 pg/mL (ref 180–914)

## 2020-08-14 LAB — FERRITIN: Ferritin: 107 ng/mL (ref 24–336)

## 2020-08-14 LAB — VITAMIN D 25 HYDROXY (VIT D DEFICIENCY, FRACTURES): Vit D, 25-Hydroxy: 51.39 ng/mL (ref 30–100)

## 2020-08-15 LAB — PROSTATE-SPECIFIC AG, SERUM (LABCORP): Prostate Specific Ag, Serum: <0.1 ng/mL

## 2020-08-17 DIAGNOSIS — R809 Proteinuria, unspecified: Secondary | ICD-10-CM | POA: Diagnosis not present

## 2020-08-17 DIAGNOSIS — E1129 Type 2 diabetes mellitus with other diabetic kidney complication: Secondary | ICD-10-CM | POA: Diagnosis not present

## 2020-08-17 DIAGNOSIS — N189 Chronic kidney disease, unspecified: Secondary | ICD-10-CM | POA: Diagnosis not present

## 2020-08-17 DIAGNOSIS — D631 Anemia in chronic kidney disease: Secondary | ICD-10-CM | POA: Diagnosis not present

## 2020-08-17 DIAGNOSIS — E1122 Type 2 diabetes mellitus with diabetic chronic kidney disease: Secondary | ICD-10-CM | POA: Diagnosis not present

## 2020-08-21 ENCOUNTER — Inpatient Hospital Stay (HOSPITAL_BASED_OUTPATIENT_CLINIC_OR_DEPARTMENT_OTHER): Payer: Medicare Other | Admitting: Hematology

## 2020-08-21 ENCOUNTER — Other Ambulatory Visit: Payer: Self-pay

## 2020-08-21 VITALS — BP 138/60 | HR 63 | Temp 97.8°F | Resp 16 | Wt 191.8 lb

## 2020-08-21 DIAGNOSIS — D631 Anemia in chronic kidney disease: Secondary | ICD-10-CM | POA: Diagnosis not present

## 2020-08-21 DIAGNOSIS — Z923 Personal history of irradiation: Secondary | ICD-10-CM | POA: Diagnosis not present

## 2020-08-21 DIAGNOSIS — C61 Malignant neoplasm of prostate: Secondary | ICD-10-CM

## 2020-08-21 DIAGNOSIS — N189 Chronic kidney disease, unspecified: Secondary | ICD-10-CM | POA: Diagnosis not present

## 2020-08-21 NOTE — Progress Notes (Signed)
[place], [place] [address]   CLINIC:  Medical Oncology/Hematology  PCP:  Vivi Barrack, MD [address] / [place] [address] [phone]   REASON FOR VISIT:  Follow-up for prostate cancer  PRIOR THERAPY:  1. Radical prostatectomy in 06/2004. 2. Radiation from 08/01/2010 to 09/14/2010.  NGS Results: Not done  CURRENT THERAPY: Surveillance  BRIEF ONCOLOGIC HISTORY:  Oncology History   No history exists.    CANCER STAGING: Cancer Staging No matching staging information was found for the patient.  INTERVAL HISTORY:  Richard Swanson, a 86 y.o. male, returns for routine follow-up of his prostate cancer. Richard Swanson was last seen on 02/14/2020.   Today he reports feeling well. He is not taking any iron tablets; he is being followed by Dr. Theador Hawthorne for his kidneys. He denies having any difficulty with urinating or dysuria or abdominal pain. He complains of having tinnitus in his left ear which is chronic.   REVIEW OF SYSTEMS:  Review of Systems  Constitutional: Negative for appetite change and fatigue.  HENT:   Positive for tinnitus (L ear).   Gastrointestinal: Negative for abdominal pain.  Genitourinary: Negative for difficulty urinating and dysuria.   All other systems reviewed and are negative.   PAST MEDICAL/SURGICAL HISTORY:  Past Medical History:  Diagnosis Date  . Cancer [facility])    Prostate  . Chronic obstructive pulmonary disease, unspecified ([facility])   . Diabetes mellitus without complication ([facility])   . Dizziness   . Gastro-esophageal reflux disease without esophagitis   . Glaucoma   . Glomerular disorders in diseases classified elsewhere   . Gross hematuria   . Hypertension   . Hypertensive chronic kidney disease with stage 1 through stage 4 chronic kidney disease, or unspecified chronic kidney disease   . Kidney disease, chronic, stage III (moderate, EGFR 30-59 ml/min) ([facility])   . Malignant neoplasm of prostate  ([facility])   . Prostate cancer ([facility]) 11/03/2010   dx 2006  . Type 2 diabetes mellitus with diabetic nephropathy ([facility])   . Unspecified kidney failure    Past Surgical History:  Procedure Laterality Date  . PROSTATE SURGERY      SOCIAL HISTORY:  Social History   Socioeconomic History  . Marital status: Married    Spouse name: Not on file  . Number of children: Not on file  . Years of education: Not on file  . Highest education level: Not on file  Occupational History  . Occupation: Retired  Tobacco Use  . Smoking status: Never Smoker  . Smokeless tobacco: Never Used  Vaping Use  . Vaping Use: Never used  Substance and Sexual Activity  . Alcohol use: No  . Drug use: No  . Sexual activity: Not on file  Other Topics Concern  . Not on file  Social History Narrative  . Not on file   Social Determinants of Health   Financial Resource Strain: Low Risk   . Difficulty of Paying Living Expenses: Not hard at all  Food Insecurity: No Food Insecurity  . Worried About Charity fundraiser in the Last Year: Never true  . Ran Out of Food in the Last Year: Never true  Transportation Needs: No Transportation Needs  . Lack of Transportation (Medical): No  . Lack of Transportation (Non-Medical): No  Physical Activity: Sufficiently Active  . Days of Exercise per Week: 3 days  . Minutes of Exercise per Session: 60 min  Stress: No Stress Concern Present  .  Feeling of Stress : Not at all  Social Connections: Socially Integrated  . Frequency of Communication with Friends and Family: Twice a week  . Frequency of Social Gatherings with Friends and Family: More than three times a week  . Attends Religious Services: More than 4 times per year  . Active Member of Clubs or Organizations: Yes  . Attends Archivist Meetings: 1 to 4 times per year  . Marital Status: Married  Human resources officer Violence: Not At Risk  . Fear of Current or Ex-Partner: No  . Emotionally Abused: No  . Physically  Abused: No  . Sexually Abused: No    FAMILY HISTORY:  Family History  Problem Relation Age of Onset  . Stroke Father     CURRENT MEDICATIONS:  Current Outpatient Medications  Medication Sig Dispense Refill  . amLODipine (NORVASC) 10 MG tablet Take 1 tablet (10 mg total) by mouth daily. 90 tablet 3  . glipiZIDE (GLUCOTROL XL) 5 MG 24 hr tablet TAKE ONE TABLET BY MOUTH ONCE DAILY WITH BREAKFAST. 90 tablet 0  . JANUVIA 50 MG tablet TAKE ONE TABLET BY MOUTH ONCE DAILY. 90 tablet 0  . latanoprost (XALATAN) 0.005 % ophthalmic solution 1 drop at bedtime.    Marland Kitchen lisinopril (ZESTRIL) 20 MG tablet TAKE ONE TABLET BY MOUTH ONCE DAILY. 90 tablet 0  . metoprolol succinate (TOPROL-XL) 25 MG 24 hr tablet Take 1 tablet (25 mg total) by mouth daily. 90 tablet 3  . mirabegron ER (MYRBETRIQ) 25 MG TB24 tablet Take 1 tablet (25 mg total) by mouth daily. 30 tablet 5  . rosuvastatin (CRESTOR) 5 MG tablet TAKE ONE TABLET BY MOUTH ONCE DAILY. 90 tablet 0  . VYZULTA 0.024 % SOLN Apply 1 drop to eye at bedtime.     No current facility-administered medications for this visit.    ALLERGIES:  No Known Allergies  PHYSICAL EXAM:  Performance status (ECOG): 1 - Symptomatic but completely ambulatory  Vitals:   08/21/20 1511  BP: 138/60  Pulse: 63  Resp: 16  Temp: 97.8 F (36.6 C)  SpO2: 100%   Wt Readings from Last 3 Encounters:  08/21/20 191 lb 12.8 oz (87 kg)  05/22/20 187 lb (84.8 kg)  04/06/20 185 lb 6.4 oz (84.1 kg)   Physical Exam Vitals reviewed.  Constitutional:      Appearance: Normal appearance.  Cardiovascular:     Rate and Rhythm: Normal rate and regular rhythm.     Pulses: Normal pulses.     Heart sounds: Normal heart sounds.  Pulmonary:     Effort: Pulmonary effort is normal.     Breath sounds: Normal breath sounds.  Musculoskeletal:     Right lower leg: No edema.     Left lower leg: No edema.  Neurological:     General: No focal deficit present.     Mental Status: He is  alert and oriented to person, place, and time.  Psychiatric:        Mood and Affect: Mood normal.        Behavior: Behavior normal.      LABORATORY DATA:  I have reviewed the labs as listed.  CBC Latest Ref Rng & Units 08/14/2020 04/06/2020 02/03/2020  WBC 4.0 - 10.5 K/uL 5.2 5.2 4.6  Hemoglobin 13.0 - 17.0 g/dL 13.1 11.8(L) 12.1(L)  Hematocrit 39.0 - 52.0 % 38.4(L) 34.9(L) 35.7(L)  Platelets 150 - 400 K/uL 154 147 154   CMP Latest Ref Rng & Units 08/14/2020 04/06/2020 02/03/2020  Glucose 70 - 99 mg/dL 169(H) 210(H) 154(H)  BUN 8 - 23 mg/dL 25(H) 27(H) 34(H)  Creatinine 0.61 - 1.24 mg/dL 2.12(H) 2.23(H) 2.49(H)  Sodium 135 - 145 mmol/L 137 139 136  Potassium 3.5 - 5.1 mmol/L 4.5 4.4 4.8  Chloride 98 - 111 mmol/L 103 105 102  CO2 22 - 32 mmol/L 26 28 25   Calcium 8.9 - 10.3 mg/dL 9.1 9.2 9.1  Total Protein 6.5 - 8.1 g/dL 8.0 7.0 7.6  Total Bilirubin 0.3 - 1.2 mg/dL 0.6 0.6 0.8  Alkaline Phos 38 - 126 U/L 48 - 47  AST 15 - 41 U/L 23 19 24   ALT 0 - 44 U/L 24 23 26    Lab Results  Component Value Date   LDH 206 (H) 08/14/2020   LDH 177 02/03/2020   LDH 156 07/29/2019   Lab Results  Component Value Date   VD25OH 51.39 08/14/2020   VD25OH 40.69 02/03/2020   VD25OH 33.17 07/29/2019   Lab Results  Component Value Date   TIBC 318 08/14/2020   TIBC 300 02/03/2020   TIBC 273 05/14/2019   FERRITIN 107 08/14/2020   FERRITIN 95 02/03/2020   FERRITIN 190 05/14/2019   IRONPCTSAT 25 08/14/2020   IRONPCTSAT 22 02/03/2020    DIAGNOSTIC IMAGING:  I have independently reviewed the scans and discussed with the patient. No results found.   ASSESSMENT:  1. Prostate cancer: -Gleason score 5, status post radical prostatectomy in March 2006 by Dr. Michela Pitcher. -Status post radiation by Dr. Valere Dross on 08/01/2010 through 09/14/2010  2.  CKD: -Followed by Dr. Theador Hawthorne.   PLAN:  1. Prostate cancer: -He does not have any new onset pains. - His PSA is less than 0.1.  Alk phos was within normal  limits. - Reports chronic ringing in the left ear which has been stable for many years. - RTC 6 months for follow-up with repeat labs.  2.  Normocytic anemia: -Mild anemia from CKD and relative iron deficiency. - Hemoglobin improved to 13.1.  Ferritin is 107 and percent saturation 25.   Orders placed this encounter:  Orders Placed This Encounter  Procedures  . PSA  . Vitamin B12  . VITAMIN D 25 Hydroxy (Vit-D Deficiency, Fractures)  . Folate  . Iron and TIBC  . Ferritin  . CBC with Differential/Platelet  . Comprehensive metabolic panel  . Lactate dehydrogenase     Derek Jack, MD [facility] [phone]   I, Milinda Antis, am acting as a scribe for Dr. Sanda Linger.  I, Derek Jack MD, have reviewed the above documentation for accuracy and completeness, and I agree with the above.

## 2020-08-21 NOTE — Patient Instructions (Signed)
[facility] at [facility] Discharge Instructions  You were seen today by Dr. Katragadda. He went over your recent results. Dr. Katragadda will see you back in 6 months for labs and follow up.   Thank you for choosing [facility] at [facility] to provide your oncology and hematology care.  To afford each patient quality time with our provider, please arrive at least 15 minutes before your scheduled appointment time.   If you have a lab appointment with the Cancer Center please come in thru the Main Entrance and check in at the main information desk  You need to re-schedule your appointment should you arrive 10 or more minutes late.  We strive to give you quality time with our providers, and arriving late affects you and other patients whose appointments are after yours.  Also, if you no show three or more times for appointments you may be dismissed from the clinic at the providers discretion.     Again, thank you for choosing [facility].  Our hope is that these requests will decrease the amount of time that you wait before being seen by our physicians.       _____________________________________________________________  Should you have questions after your visit to [facility], please contact our office at [phone] between the hours of 8:00 a.m. and 4:30 p.m.  Voicemails left after 4:00 p.m. will not be returned until the following business day.  For prescription refill requests, have your pharmacy contact our office and allow 72 hours.    Cancer Center Support Programs:   > Cancer Support Group  2nd Tuesday of the month 1pm-2pm, Journey Room    

## 2020-08-29 ENCOUNTER — Other Ambulatory Visit: Payer: Self-pay | Admitting: Family Medicine

## 2020-08-29 DIAGNOSIS — I10 Essential (primary) hypertension: Secondary | ICD-10-CM

## 2020-08-30 DIAGNOSIS — E1129 Type 2 diabetes mellitus with other diabetic kidney complication: Secondary | ICD-10-CM | POA: Diagnosis not present

## 2020-08-30 DIAGNOSIS — N189 Chronic kidney disease, unspecified: Secondary | ICD-10-CM | POA: Diagnosis not present

## 2020-08-30 DIAGNOSIS — R809 Proteinuria, unspecified: Secondary | ICD-10-CM | POA: Diagnosis not present

## 2020-08-30 DIAGNOSIS — E1122 Type 2 diabetes mellitus with diabetic chronic kidney disease: Secondary | ICD-10-CM | POA: Diagnosis not present

## 2020-08-30 DIAGNOSIS — D696 Thrombocytopenia, unspecified: Secondary | ICD-10-CM | POA: Diagnosis not present

## 2020-08-30 DIAGNOSIS — I129 Hypertensive chronic kidney disease with stage 1 through stage 4 chronic kidney disease, or unspecified chronic kidney disease: Secondary | ICD-10-CM | POA: Diagnosis not present

## 2020-08-30 DIAGNOSIS — D631 Anemia in chronic kidney disease: Secondary | ICD-10-CM | POA: Diagnosis not present

## 2020-09-05 ENCOUNTER — Other Ambulatory Visit: Payer: Self-pay | Admitting: "Endocrinology

## 2020-09-28 ENCOUNTER — Other Ambulatory Visit: Payer: Self-pay | Admitting: "Endocrinology

## 2020-09-28 DIAGNOSIS — E1159 Type 2 diabetes mellitus with other circulatory complications: Secondary | ICD-10-CM

## 2020-09-28 DIAGNOSIS — E782 Mixed hyperlipidemia: Secondary | ICD-10-CM

## 2020-09-28 DIAGNOSIS — I152 Hypertension secondary to endocrine disorders: Secondary | ICD-10-CM

## 2020-10-02 DIAGNOSIS — Z961 Presence of intraocular lens: Secondary | ICD-10-CM | POA: Diagnosis not present

## 2020-10-02 DIAGNOSIS — H401132 Primary open-angle glaucoma, bilateral, moderate stage: Secondary | ICD-10-CM | POA: Diagnosis not present

## 2020-10-05 ENCOUNTER — Ambulatory Visit: Payer: Medicare Other | Admitting: Nurse Practitioner

## 2020-10-05 ENCOUNTER — Encounter: Payer: Self-pay | Admitting: Nurse Practitioner

## 2020-10-05 ENCOUNTER — Other Ambulatory Visit: Payer: Self-pay

## 2020-10-05 VITALS — BP 131/68 | HR 53 | Ht 72.0 in | Wt 191.8 lb

## 2020-10-05 DIAGNOSIS — E1159 Type 2 diabetes mellitus with other circulatory complications: Secondary | ICD-10-CM

## 2020-10-05 DIAGNOSIS — E782 Mixed hyperlipidemia: Secondary | ICD-10-CM

## 2020-10-05 DIAGNOSIS — E1121 Type 2 diabetes mellitus with diabetic nephropathy: Secondary | ICD-10-CM | POA: Diagnosis not present

## 2020-10-05 DIAGNOSIS — I10 Essential (primary) hypertension: Secondary | ICD-10-CM | POA: Diagnosis not present

## 2020-10-05 DIAGNOSIS — I152 Hypertension secondary to endocrine disorders: Secondary | ICD-10-CM | POA: Diagnosis not present

## 2020-10-05 LAB — POCT GLYCOSYLATED HEMOGLOBIN (HGB A1C): HbA1c, POC (controlled diabetic range): 8.3 % — AB (ref 0.0–7.0)

## 2020-10-05 MED ORDER — GLIPIZIDE ER 5 MG PO TB24: 10.0000 mg | ORAL_TABLET | Freq: Every day | ORAL | 3 refills | Status: DC

## 2020-10-05 NOTE — Progress Notes (Signed)
10/05/2020, 10:29 AM   Endocrinology follow-up note  Subjective:    Patient ID: Richard Swanson, male    DOB: 1935/03/01.  Richard Swanson is being seen in follow-up after he was seen in consultation for management of currently uncontrolled symptomatic diabetes requested by  Vivi Barrack, MD.   Past Medical History:  Diagnosis Date   Cancer [facility])    Prostate   Chronic obstructive pulmonary disease, unspecified ([place])    Diabetes mellitus without complication ([place])    Dizziness    Gastro-esophageal reflux disease without esophagitis    Glaucoma    Glomerular disorders in diseases classified elsewhere    Gross hematuria    Hypertension    Hypertensive chronic kidney disease with stage 1 through stage 4 chronic kidney disease, or unspecified chronic kidney disease    Kidney disease, chronic, stage III (moderate, EGFR 30-59 ml/min) ([place])    Malignant neoplasm of prostate ([place])    Prostate cancer ([place]) 11/03/2010   dx 2006   Type 2 diabetes mellitus with diabetic nephropathy ([place])    Unspecified kidney failure     Past Surgical History:  Procedure Laterality Date   PROSTATE SURGERY      Social History   Socioeconomic History   Marital status: Married    Spouse name: Not on file   Number of children: Not on file   Years of education: Not on file   Highest education level: Not on file  Occupational History   Occupation: Retired  Tobacco Use   Smoking status: Never   Smokeless tobacco: Never  Vaping Use   Vaping Use: Never used  Substance and Sexual Activity   Alcohol use: No   Drug use: No   Sexual activity: Not on file  Other Topics Concern   Not on file  Social History Narrative   Not on file   Social Determinants of Health   Financial Resource Strain: Low Risk    Difficulty of Paying Living Expenses: Not hard at all  Food Insecurity: No Food Insecurity   Worried About Ship broker in the Last Year: Never true   Ran Out of Food in the Last Year: Never true  Transportation Needs: No Transportation Needs   Lack of Transportation (Medical): No   Lack of Transportation (Non-Medical): No  Physical Activity: Sufficiently Active   Days of Exercise per Week: 3 days   Minutes of Exercise per Session: 60 min  Stress: No Stress Concern Present   Feeling of Stress : Not at all  Social Connections: Socially Integrated   Frequency of Communication with Friends and Family: Twice a week   Frequency of Social Gatherings with Friends and Family: More than three times a week   Attends Religious Services: More than 4 times per year   Active Member of [employer] or Organizations: Yes   Attends Archivist Meetings: 1 to 4 times per year   Marital Status: Married    Family History  Problem Relation Age of Onset   Stroke Father     Outpatient Encounter Medications as of 10/05/2020  Medication Sig   amLODipine (NORVASC) 10 MG tablet Take 1 tablet (10  mg total) by mouth daily.   glipiZIDE (GLUCOTROL XL) 5 MG 24 hr tablet Take 2 tablets (10 mg total) by mouth daily with breakfast.   JANUVIA 50 MG tablet TAKE ONE TABLET BY MOUTH ONCE DAILY.   latanoprost (XALATAN) 0.005 % ophthalmic solution 1 drop at bedtime.   lisinopril (ZESTRIL) 20 MG tablet TAKE ONE TABLET BY MOUTH ONCE DAILY.   metoprolol succinate (TOPROL-XL) 25 MG 24 hr tablet TAKE 1 TABLET BY MOUTH EVERY DAY.   mirabegron ER (MYRBETRIQ) 25 MG TB24 tablet Take 1 tablet (25 mg total) by mouth daily.   rosuvastatin (CRESTOR) 5 MG tablet TAKE ONE TABLET BY MOUTH ONCE DAILY.   VYZULTA 0.024 % SOLN Apply 1 drop to eye at bedtime. (Patient not taking: Reported on 10/05/2020)   [DISCONTINUED] glipiZIDE (GLUCOTROL XL) 5 MG 24 hr tablet TAKE ONE TABLET BY MOUTH ONCE DAILY WITH BREAKFAST.   No facility-administered encounter medications on file as of 10/05/2020.    ALLERGIES: No Known Allergies  VACCINATION  STATUS: Immunization History  Administered Date(s) Administered   Fluad Quad(high Dose 65+) 02/14/2020   Influenza, High Dose Seasonal PF 03/23/2018   Influenza,inj,Quad PF,6+ Mos 01/30/2016, 01/29/2017, 01/28/2019   Moderna SARS-COV2 Booster Vaccination 03/16/2020   Moderna Sars-Covid-2 Vaccination 06/05/2019, 07/06/2019   Pneumococcal Conjugate-13 04/06/2020   Pneumococcal Polysaccharide-23 01/28/2019    Diabetes He presents for his follow-up diabetic visit. He has type 2 diabetes mellitus. Onset time: He was diagnosed at approximate age of 47 years. His disease course has been worsening. There are no hypoglycemic associated symptoms. Pertinent negatives for hypoglycemia include no confusion, headaches, pallor or seizures. There are no diabetic associated symptoms. Pertinent negatives for diabetes include no chest pain, no fatigue, no polydipsia, no polyphagia, no polyuria and no weakness. There are no hypoglycemic complications. Symptoms are stable. Diabetic complications include nephropathy. Risk factors for coronary artery disease include dyslipidemia, diabetes mellitus, hypertension, male sex and sedentary lifestyle. Current diabetic treatment includes oral agent (dual therapy). He is compliant with treatment most of the time. His weight is fluctuating minimally. He is following a generally healthy diet. When asked about meal planning, he reported none. He has not had a previous visit with a dietitian. He rarely participates in exercise. His home blood glucose trend is fluctuating minimally. His breakfast blood glucose range is generally 180-200 mg/dl. His overall blood glucose range is 180-200 mg/dl. (He presents today with his logs, no meter, showing slightly above target fasting glycemic profile.  His POCT A1c today is 8.3%, increasing from previous visit of 7.6%.  He denies any significant hypoglycemia.  He reports no changes in is activity level or diet.) An ACE inhibitor/angiotensin II  receptor blocker is being taken. He does not see a podiatrist.Eye exam is current.  Hyperlipidemia This is a chronic problem. The current episode started more than 1 year ago. The problem is controlled. Recent lipid tests were reviewed and are normal. Exacerbating diseases include chronic renal disease and diabetes. Factors aggravating his hyperlipidemia include beta blockers. Pertinent negatives include no chest pain, myalgias or shortness of breath. Current antihyperlipidemic treatment includes statins. The current treatment provides mild improvement of lipids. There are no compliance problems.  Risk factors for coronary artery disease include dyslipidemia, diabetes mellitus, hypertension, male sex and a sedentary lifestyle.  Hypertension This is a chronic problem. The current episode started more than 1 year ago. The problem has been gradually improving since onset. The problem is controlled. Pertinent negatives include no chest pain, headaches, neck pain,  palpitations or shortness of breath. There are no associated agents to hypertension. Risk factors for coronary artery disease include diabetes mellitus, dyslipidemia, male gender and sedentary lifestyle. Past treatments include ACE inhibitors, beta blockers and calcium channel blockers. The current treatment provides significant improvement. There are no compliance problems.  Hypertensive end-organ damage includes kidney disease. Identifiable causes of hypertension include chronic renal disease.   Review of systems  Constitutional: + Minimally fluctuating body weight,  current Body mass index is 26.01 kg/m. , no fatigue, no subjective hyperthermia, no subjective hypothermia Eyes: no blurry vision, no xerophthalmia ENT: no sore throat, no nodules palpated in throat, no dysphagia/odynophagia, no hoarseness Cardiovascular: no chest pain, no shortness of breath, no palpitations, no leg swelling Respiratory: no cough, no shortness of  breath Gastrointestinal: no nausea/vomiting/diarrhea Musculoskeletal: no muscle/joint aches Skin: no rashes, no hyperemia Neurological: no tremors, no numbness, no tingling, no dizziness Psychiatric: no depression, no anxiety   Objective:    BP 131/68   Pulse (!) 53   Ht 6' (1.829 m)   Wt 191 lb 12.8 oz (87 kg)   BMI 26.01 kg/m   Wt Readings from Last 3 Encounters:  10/05/20 191 lb 12.8 oz (87 kg)  08/21/20 191 lb 12.8 oz (87 kg)  05/22/20 187 lb (84.8 kg)    BP Readings from Last 3 Encounters:  10/05/20 131/68  08/21/20 138/60  05/22/20 (!) 155/65      Physical Exam- Limited  Constitutional:  Body mass index is 26.01 kg/m. , not in acute distress, normal state of mind Eyes:  EOMI, no exophthalmos Neck: Supple Cardiovascular: RRR, no murmurs, rubs, or gallops, no edema Respiratory: Adequate breathing efforts, no crackles, rales, rhonchi, or wheezing Musculoskeletal: no gross deformities, strength intact in all four extremities, no gross restriction of joint movements Skin:  no rashes, no hyperemia Neurological: no tremor with outstretched hands   Foot exam:  No rashes, ulcers, cuts, minimal calluses, mild onychodystrophy bilaterally.  Good pulses bilat. Good sensation to 10 g monofilament bilat.    CMP     Component Value Date/Time   NA 137 08/14/2020 1308   NA 140 05/14/2019 0000   K 4.5 08/14/2020 1308   CL 103 08/14/2020 1308   CO2 26 08/14/2020 1308   GLUCOSE 169 (H) 08/14/2020 1308   BUN 25 (H) 08/14/2020 1308   BUN 26 (A) 05/14/2019 0000   CREATININE 2.12 (H) 08/14/2020 1308   CREATININE 2.23 (H) 04/06/2020 1206   CALCIUM 9.1 08/14/2020 1308   PROT 8.0 08/14/2020 1308   ALBUMIN 4.3 08/14/2020 1308   AST 23 08/14/2020 1308   ALT 24 08/14/2020 1308   ALKPHOS 48 08/14/2020 1308   BILITOT 0.6 08/14/2020 1308   GFRNONAA 30 (L) 08/14/2020 1308   GFRAA 29 (L) 07/29/2019 1115    Diabetic Labs (most recent): Lab Results  Component Value Date    HGBA1C 8.3 (A) 10/05/2020   HGBA1C 7.6 (A) 04/04/2020   HGBA1C 6.8 (A) 11/30/2019     Lipid Panel ( most recent) Lipid Panel     Component Value Date/Time   CHOL 120 04/06/2020 1206   TRIG 101 04/06/2020 1206   HDL 43 04/06/2020 1206   CHOLHDL 2.8 04/06/2020 1206   LDLCALC 59 04/06/2020 1206      Lab Results  Component Value Date   TSH 0.49 04/06/2020   TSH 1.48 05/05/2017      Assessment & Plan:   1) Type 2 diabetes mellitus with diabetic nephropathy, without long-term current  use of insulin ([place])  - Richard Swanson has currently uncontrolled symptomatic type 2 DM since 86 years of age.  He presents today with his logs, no meter, showing slightly above target fasting glycemic profile.  His POCT A1c today is 8.3%, increasing from previous visit of 7.6%.  He denies any significant hypoglycemia.  He reports no changes in is activity level or diet.  - Recent labs reviewed.  - I had a long discussion with him about the progressive nature of diabetes and the pathology behind its complications. -his diabetes is complicated by CKD and he remains at a high risk for more acute and chronic complications which include CAD, CVA, CKD, retinopathy, and neuropathy. These are all discussed in detail with him.  - Nutritional counseling repeated at each appointment due to patients tendency to fall back in to old habits.  - The patient admits there is a room for improvement in their diet and drink choices. -  Suggestion is made for the patient to avoid simple carbohydrates from their diet including Cakes, Sweet Desserts / Pastries, Ice Cream, Soda (diet and regular), Sweet Tea, Candies, Chips, Cookies, Sweet Pastries, Store Bought Juices, Alcohol in Excess of 1-2 drinks a day, Artificial Sweeteners, Coffee Creamer, and "Sugar-free" Products. This will help patient to have stable blood glucose profile and potentially avoid unintended weight gain.   - I encouraged the patient to switch to  unprocessed or minimally processed complex starch and increased protein intake (animal or plant source), fruits, and vegetables.   - Patient is advised to stick to a routine mealtimes to eat 3 meals a day and avoid unnecessary snacks (to snack only to correct hypoglycemia).  - he is scheduled with Jearld Fenton, RDN, CDE for diabetes education after his visit with me today.  - I have approached him with the following individualized plan to manage  his diabetes and patient agrees:   -Given his above target fasting glycemic profile, he is advised to increase his Glipizide to 10 XL daily with breakfast and continue his Januvia 50 mg po daily (cannot increase due to renal function).   -He is encouraged to continue monitoring blood glucose once daily, before breakfast and to call the clinic if he has readings less than 70 or greater than 200 for 3 tests in a row.  - he is not a candidate for metformin, SGLT2 inhibitors due to concurrent renal insufficiency.  - Specific targets for  A1c;  LDL, HDL,  and Triglycerides were discussed with the patient.  2) Blood Pressure /Hypertension:  His blood pressure is controlled to target  He is advised to continue Lisinopril 20 mg p.o. daily, Metoprolol 25 mg p.o. daily, and Amlodipine 10 mg p.o. daily.  3) Lipids/Hyperlipidemia:   His most recent lipid panel from 04/06/20 shows controlled LDL of 59.  He is advised to continue Crestor 5 mg po daily at bedtime.  Side effects and precautions discussed with him.   4)  Weight/Diet: His Body mass index is 26.01 kg/m.  -  he is not a candidate for weight loss.  Exercise, and detailed carbohydrates information provided  -  detailed on discharge instructions.  5) Chronic Care/Health Maintenance: -he  is on ACEI/ARB and Statin medications and is encouraged to initiate and continue to follow up with Ophthalmology, Dentist, Nephrologist, and Podiatrist at least yearly or according to recommendations, and advised to  stay away from smoking. I have recommended yearly flu vaccine and pneumonia vaccine at least every 5 years; moderate  intensity exercise for up to 150 minutes weekly; and  sleep for at least 7 hours a day.  - he is  advised to maintain close follow up with Vivi Barrack, MD for primary care needs, as well as his other providers for optimal and coordinated care.     I spent 30 minutes in the care of the patient today including review of labs from [place], Lipids, Thyroid Function, Hematology (current and previous including abstractions from other facilities); face-to-face time discussing  his blood glucose readings/logs, discussing hypoglycemia and hyperglycemia episodes and symptoms, medications doses, his options of short and long term treatment based on the latest standards of care / guidelines;  discussion about incorporating lifestyle medicine;  and documenting the encounter.    Please refer to Patient Instructions for Blood Glucose Monitoring and Insulin/Medications Dosing Guide"  in media tab for additional information. Please  also refer to " Patient Self Inventory" in the Media  tab for reviewed elements of pertinent patient history.  Richard Swanson participated in the discussions, expressed understanding, and voiced agreement with the above plans.  All questions were answered to his satisfaction. he is encouraged to contact clinic should he have any questions or concerns prior to his return visit.   Follow up plan: - Return in about 4 months (around 02/04/2021) for Diabetes F/U with A1c in office, No previsit labs, Bring meter and logs.  Rayetta Pigg, [facility] [address] [place], [place] [address] Phone: [phone] Fax: [phone]   10/05/2020, 10:29 AM

## 2020-10-05 NOTE — Patient Instructions (Signed)

## 2020-10-09 ENCOUNTER — Ambulatory Visit: Payer: Medicare Other | Admitting: Nurse Practitioner

## 2020-10-27 DIAGNOSIS — N189 Chronic kidney disease, unspecified: Secondary | ICD-10-CM | POA: Diagnosis not present

## 2020-10-27 DIAGNOSIS — D696 Thrombocytopenia, unspecified: Secondary | ICD-10-CM | POA: Diagnosis not present

## 2020-10-27 DIAGNOSIS — D631 Anemia in chronic kidney disease: Secondary | ICD-10-CM | POA: Diagnosis not present

## 2020-10-27 DIAGNOSIS — I129 Hypertensive chronic kidney disease with stage 1 through stage 4 chronic kidney disease, or unspecified chronic kidney disease: Secondary | ICD-10-CM | POA: Diagnosis not present

## 2020-10-27 DIAGNOSIS — E1122 Type 2 diabetes mellitus with diabetic chronic kidney disease: Secondary | ICD-10-CM | POA: Diagnosis not present

## 2020-10-27 DIAGNOSIS — E559 Vitamin D deficiency, unspecified: Secondary | ICD-10-CM | POA: Diagnosis not present

## 2020-11-03 DIAGNOSIS — D631 Anemia in chronic kidney disease: Secondary | ICD-10-CM | POA: Diagnosis not present

## 2020-11-03 DIAGNOSIS — I129 Hypertensive chronic kidney disease with stage 1 through stage 4 chronic kidney disease, or unspecified chronic kidney disease: Secondary | ICD-10-CM | POA: Diagnosis not present

## 2020-11-03 DIAGNOSIS — D696 Thrombocytopenia, unspecified: Secondary | ICD-10-CM | POA: Diagnosis not present

## 2020-11-03 DIAGNOSIS — E1122 Type 2 diabetes mellitus with diabetic chronic kidney disease: Secondary | ICD-10-CM | POA: Diagnosis not present

## 2020-11-03 DIAGNOSIS — N170 Acute kidney failure with tubular necrosis: Secondary | ICD-10-CM | POA: Diagnosis not present

## 2020-11-03 DIAGNOSIS — N189 Chronic kidney disease, unspecified: Secondary | ICD-10-CM | POA: Diagnosis not present

## 2020-11-10 ENCOUNTER — Other Ambulatory Visit: Payer: Self-pay | Admitting: Family Medicine

## 2020-11-25 ENCOUNTER — Other Ambulatory Visit: Payer: Self-pay | Admitting: Family Medicine

## 2020-11-25 DIAGNOSIS — I10 Essential (primary) hypertension: Secondary | ICD-10-CM

## 2020-11-27 ENCOUNTER — Other Ambulatory Visit: Payer: Self-pay

## 2020-11-27 ENCOUNTER — Other Ambulatory Visit: Payer: Self-pay | Admitting: Nurse Practitioner

## 2020-11-27 DIAGNOSIS — N189 Chronic kidney disease, unspecified: Secondary | ICD-10-CM | POA: Diagnosis not present

## 2020-11-27 DIAGNOSIS — D696 Thrombocytopenia, unspecified: Secondary | ICD-10-CM | POA: Diagnosis not present

## 2020-11-27 DIAGNOSIS — N170 Acute kidney failure with tubular necrosis: Secondary | ICD-10-CM | POA: Diagnosis not present

## 2020-11-27 DIAGNOSIS — E1122 Type 2 diabetes mellitus with diabetic chronic kidney disease: Secondary | ICD-10-CM | POA: Diagnosis not present

## 2020-11-27 DIAGNOSIS — D631 Anemia in chronic kidney disease: Secondary | ICD-10-CM | POA: Diagnosis not present

## 2020-11-27 MED ORDER — SITAGLIPTIN PHOSPHATE 50 MG PO TABS: 50.0000 mg | ORAL_TABLET | Freq: Every day | ORAL | 0 refills | Status: DC

## 2020-11-27 NOTE — Telephone Encounter (Signed)
Can you send this in for him?

## 2020-12-01 DIAGNOSIS — N189 Chronic kidney disease, unspecified: Secondary | ICD-10-CM | POA: Diagnosis not present

## 2020-12-01 DIAGNOSIS — E559 Vitamin D deficiency, unspecified: Secondary | ICD-10-CM | POA: Diagnosis not present

## 2020-12-01 DIAGNOSIS — R809 Proteinuria, unspecified: Secondary | ICD-10-CM | POA: Diagnosis not present

## 2020-12-01 DIAGNOSIS — D696 Thrombocytopenia, unspecified: Secondary | ICD-10-CM | POA: Diagnosis not present

## 2020-12-01 DIAGNOSIS — N170 Acute kidney failure with tubular necrosis: Secondary | ICD-10-CM | POA: Diagnosis not present

## 2020-12-01 DIAGNOSIS — E1129 Type 2 diabetes mellitus with other diabetic kidney complication: Secondary | ICD-10-CM | POA: Diagnosis not present

## 2020-12-01 DIAGNOSIS — D631 Anemia in chronic kidney disease: Secondary | ICD-10-CM | POA: Diagnosis not present

## 2020-12-01 DIAGNOSIS — I129 Hypertensive chronic kidney disease with stage 1 through stage 4 chronic kidney disease, or unspecified chronic kidney disease: Secondary | ICD-10-CM | POA: Diagnosis not present

## 2020-12-01 DIAGNOSIS — E875 Hyperkalemia: Secondary | ICD-10-CM | POA: Diagnosis not present

## 2020-12-04 DIAGNOSIS — E1165 Type 2 diabetes mellitus with hyperglycemia: Secondary | ICD-10-CM | POA: Diagnosis not present

## 2020-12-04 DIAGNOSIS — H35033 Hypertensive retinopathy, bilateral: Secondary | ICD-10-CM | POA: Diagnosis not present

## 2020-12-04 DIAGNOSIS — H401132 Primary open-angle glaucoma, bilateral, moderate stage: Secondary | ICD-10-CM | POA: Diagnosis not present

## 2020-12-04 DIAGNOSIS — H26493 Other secondary cataract, bilateral: Secondary | ICD-10-CM | POA: Diagnosis not present

## 2020-12-12 ENCOUNTER — Other Ambulatory Visit: Payer: Self-pay | Admitting: Family Medicine

## 2021-01-02 DIAGNOSIS — N189 Chronic kidney disease, unspecified: Secondary | ICD-10-CM | POA: Diagnosis not present

## 2021-01-02 DIAGNOSIS — E559 Vitamin D deficiency, unspecified: Secondary | ICD-10-CM | POA: Diagnosis not present

## 2021-01-02 DIAGNOSIS — N170 Acute kidney failure with tubular necrosis: Secondary | ICD-10-CM | POA: Diagnosis not present

## 2021-01-02 DIAGNOSIS — D631 Anemia in chronic kidney disease: Secondary | ICD-10-CM | POA: Diagnosis not present

## 2021-01-02 DIAGNOSIS — I129 Hypertensive chronic kidney disease with stage 1 through stage 4 chronic kidney disease, or unspecified chronic kidney disease: Secondary | ICD-10-CM | POA: Diagnosis not present

## 2021-01-02 DIAGNOSIS — D696 Thrombocytopenia, unspecified: Secondary | ICD-10-CM | POA: Diagnosis not present

## 2021-01-03 ENCOUNTER — Other Ambulatory Visit: Payer: Self-pay | Admitting: "Endocrinology

## 2021-01-03 DIAGNOSIS — E782 Mixed hyperlipidemia: Secondary | ICD-10-CM

## 2021-01-04 ENCOUNTER — Other Ambulatory Visit: Payer: Self-pay | Admitting: "Endocrinology

## 2021-01-04 DIAGNOSIS — I152 Hypertension secondary to endocrine disorders: Secondary | ICD-10-CM

## 2021-01-04 DIAGNOSIS — E1159 Type 2 diabetes mellitus with other circulatory complications: Secondary | ICD-10-CM

## 2021-01-05 DIAGNOSIS — I129 Hypertensive chronic kidney disease with stage 1 through stage 4 chronic kidney disease, or unspecified chronic kidney disease: Secondary | ICD-10-CM | POA: Diagnosis not present

## 2021-01-05 DIAGNOSIS — E1129 Type 2 diabetes mellitus with other diabetic kidney complication: Secondary | ICD-10-CM | POA: Diagnosis not present

## 2021-01-05 DIAGNOSIS — R809 Proteinuria, unspecified: Secondary | ICD-10-CM | POA: Diagnosis not present

## 2021-01-05 DIAGNOSIS — N189 Chronic kidney disease, unspecified: Secondary | ICD-10-CM | POA: Diagnosis not present

## 2021-01-05 DIAGNOSIS — N170 Acute kidney failure with tubular necrosis: Secondary | ICD-10-CM | POA: Diagnosis not present

## 2021-01-05 DIAGNOSIS — D631 Anemia in chronic kidney disease: Secondary | ICD-10-CM | POA: Diagnosis not present

## 2021-01-05 DIAGNOSIS — E1122 Type 2 diabetes mellitus with diabetic chronic kidney disease: Secondary | ICD-10-CM | POA: Diagnosis not present

## 2021-01-12 ENCOUNTER — Other Ambulatory Visit: Payer: Self-pay

## 2021-01-12 ENCOUNTER — Ambulatory Visit (INDEPENDENT_AMBULATORY_CARE_PROVIDER_SITE_OTHER): Payer: Medicare Other | Admitting: Nurse Practitioner

## 2021-01-12 ENCOUNTER — Encounter: Payer: Self-pay | Admitting: Nurse Practitioner

## 2021-01-12 VITALS — BP 137/65 | HR 58 | Ht 72.0 in | Wt 189.6 lb

## 2021-01-12 DIAGNOSIS — E1159 Type 2 diabetes mellitus with other circulatory complications: Secondary | ICD-10-CM | POA: Diagnosis not present

## 2021-01-12 DIAGNOSIS — E1121 Type 2 diabetes mellitus with diabetic nephropathy: Secondary | ICD-10-CM

## 2021-01-12 DIAGNOSIS — E782 Mixed hyperlipidemia: Secondary | ICD-10-CM

## 2021-01-12 DIAGNOSIS — I152 Hypertension secondary to endocrine disorders: Secondary | ICD-10-CM

## 2021-01-12 LAB — POCT GLYCOSYLATED HEMOGLOBIN (HGB A1C): Hemoglobin A1C: 8.4 % — AB (ref 4.0–5.6)

## 2021-01-12 NOTE — Patient Instructions (Signed)
Diabetes Mellitus and Nutrition, Adult When you have diabetes, or diabetes mellitus, it is very important to have healthy eating habits because your blood sugar (glucose) levels are greatly affected by what you eat and drink. Eating healthy foods in the right amounts, at about the same times every day, can help you:  Control your blood glucose.  Lower your risk of heart disease.  Improve your blood pressure.  Reach or maintain a healthy weight. What can affect my meal plan? Every person with diabetes is different, and each person has different needs for a meal plan. Your health care provider may recommend that you work with a dietitian to make a meal plan that is best for you. Your meal plan may vary depending on factors such as:  The calories you need.  The medicines you take.  Your weight.  Your blood glucose, blood pressure, and cholesterol levels.  Your activity level.  Other health conditions you have, such as heart or kidney disease. How do carbohydrates affect me? Carbohydrates, also called carbs, affect your blood glucose level more than any other type of food. Eating carbs naturally raises the amount of glucose in your blood. Carb counting is a method for keeping track of how many carbs you eat. Counting carbs is important to keep your blood glucose at a healthy level, especially if you use insulin or take certain oral diabetes medicines. It is important to know how many carbs you can safely have in each meal. This is different for every person. Your dietitian can help you calculate how many carbs you should have at each meal and for each snack. How does alcohol affect me? Alcohol can cause a sudden decrease in blood glucose (hypoglycemia), especially if you use insulin or take certain oral diabetes medicines. Hypoglycemia can be a life-threatening condition. Symptoms of hypoglycemia, such as sleepiness, dizziness, and confusion, are similar to symptoms of having too much  alcohol.  Do not drink alcohol if: ? Your health care provider tells you not to drink. ? You are pregnant, may be pregnant, or are planning to become pregnant.  If you drink alcohol: ? Do not drink on an empty stomach. ? Limit how much you use to:  0-1 drink a day for women.  0-2 drinks a day for men. ? Be aware of how much alcohol is in your drink. In the [place] one drink equals one 12 oz bottle of beer (355 mL), one 5 oz glass of wine (148 mL), or one 1 oz glass of hard liquor (44 mL). ? Keep yourself hydrated with water, diet soda, or unsweetened iced tea.  Keep in mind that regular soda, juice, and other mixers may contain a lot of sugar and must be counted as carbs. What are tips for following this plan? Reading food labels  Start by checking the serving size on the "Nutrition Facts" label of packaged foods and drinks. The amount of calories, carbs, fats, and other nutrients listed on the label is based on one serving of the item. Many items contain more than one serving per package.  Check the total grams (g) of carbs in one serving. You can calculate the number of servings of carbs in one serving by dividing the total carbs by 15. For example, if a food has 30 g of total carbs per serving, it would be equal to 2 servings of carbs.  Check the number of grams (g) of saturated fats and trans fats in one serving. Choose foods that have   a low amount or none of these fats.  Check the number of milligrams (mg) of salt (sodium) in one serving. Most people should limit total sodium intake to less than 2,300 mg per day.  Always check the nutrition information of foods labeled as "low-fat" or "nonfat." These foods may be higher in added sugar or refined carbs and should be avoided.  Talk to your dietitian to identify your daily goals for nutrients listed on the label. Shopping  Avoid buying canned, pre-made, or processed foods. These foods tend to be high in fat, sodium, and added  sugar.  Shop around the outside edge of the grocery store. This is where you will most often find fresh fruits and vegetables, bulk grains, fresh meats, and fresh dairy. Cooking  Use low-heat cooking methods, such as baking, instead of high-heat cooking methods like deep frying.  Cook using healthy oils, such as olive, canola, or sunflower oil.  Avoid cooking with butter, cream, or high-fat meats. Meal planning  Eat meals and snacks regularly, preferably at the same times every day. Avoid going long periods of time without eating.  Eat foods that are high in fiber, such as fresh fruits, vegetables, beans, and whole grains. Talk with your dietitian about how many servings of carbs you can eat at each meal.  Eat 4-6 oz (112-168 g) of lean protein each day, such as lean meat, chicken, fish, eggs, or tofu. One ounce (oz) of lean protein is equal to: ? 1 oz (28 g) of meat, chicken, or fish. ? 1 egg. ?  cup (62 g) of tofu.  Eat some foods each day that contain healthy fats, such as avocado, nuts, seeds, and fish.   What foods should I eat? Fruits Berries. Apples. Oranges. Peaches. Apricots. Plums. Grapes. Mango. Papaya. Pomegranate. Kiwi. Cherries. Vegetables Lettuce. Spinach. Leafy greens, including kale, chard, collard greens, and mustard greens. Beets. Cauliflower. Cabbage. Broccoli. Carrots. Green beans. Tomatoes. Peppers. Onions. Cucumbers. Brussels sprouts. Grains Whole grains, such as whole-wheat or whole-grain bread, crackers, tortillas, cereal, and pasta. Unsweetened oatmeal. Quinoa. Brown or wild rice. Meats and other proteins Seafood. Poultry without skin. Lean cuts of poultry and beef. Tofu. Nuts. Seeds. Dairy Low-fat or fat-free dairy products such as milk, yogurt, and cheese. The items listed above may not be a complete list of foods and beverages you can eat. Contact a dietitian for more information. What foods should I avoid? Fruits Fruits canned with  syrup. Vegetables Canned vegetables. Frozen vegetables with butter or cream sauce. Grains Refined white flour and flour products such as bread, pasta, snack foods, and cereals. Avoid all processed foods. Meats and other proteins Fatty cuts of meat. Poultry with skin. Breaded or fried meats. Processed meat. Avoid saturated fats. Dairy Full-fat yogurt, cheese, or milk. Beverages Sweetened drinks, such as soda or iced tea. The items listed above may not be a complete list of foods and beverages you should avoid. Contact a dietitian for more information. Questions to ask a health care provider  Do I need to meet with a diabetes educator?  Do I need to meet with a dietitian?  What number can I call if I have questions?  When are the best times to check my blood glucose? Where to find more information:  American Diabetes Association: diabetes.org  Academy of Nutrition and Dietetics: www.eatright.org  National Institute of Diabetes and Digestive and Kidney Diseases: www.niddk.nih.gov  Association of Diabetes Care and Education Specialists: www.diabeteseducator.org Summary  It is important to have healthy eating   habits because your blood sugar (glucose) levels are greatly affected by what you eat and drink.  A healthy meal plan will help you control your blood glucose and maintain a healthy lifestyle.  Your health care provider may recommend that you work with a dietitian to make a meal plan that is best for you.  Keep in mind that carbohydrates (carbs) and alcohol have immediate effects on your blood glucose levels. It is important to count carbs and to use alcohol carefully. This information is not intended to replace advice given to you by your health care provider. Make sure you discuss any questions you have with your health care provider. Document Revised: 03/23/2019 Document Reviewed: 03/23/2019 Elsevier Patient Education  2021 [employer] Inc.  

## 2021-01-12 NOTE — Progress Notes (Signed)
01/12/2021, 10:02 AM   Endocrinology follow-up note  Subjective:    Patient ID: Richard Swanson, male    DOB: [birth year].  Zephaniah Lubrano is being seen in follow-up after he was seen in consultation for management of currently uncontrolled symptomatic diabetes requested by  Vivi Barrack, MD.   Past Medical History:  Diagnosis Date   Cancer [facility])    Prostate   Chronic obstructive pulmonary disease, unspecified ([place])    Diabetes mellitus without complication ([place])    Dizziness    Gastro-esophageal reflux disease without esophagitis    Glaucoma    Glomerular disorders in diseases classified elsewhere    Gross hematuria    Hypertension    Hypertensive chronic kidney disease with stage 1 through stage 4 chronic kidney disease, or unspecified chronic kidney disease    Kidney disease, chronic, stage III (moderate, EGFR 30-59 ml/min) ([place])    Malignant neoplasm of prostate ([place])    Prostate cancer ([place]) 11/03/2010   dx 2006   Type 2 diabetes mellitus with diabetic nephropathy ([place])    Unspecified kidney failure     Past Surgical History:  Procedure Laterality Date   PROSTATE SURGERY      Social History   Socioeconomic History   Marital status: Married    Spouse name: Not on file   Number of children: Not on file   Years of education: Not on file   Highest education level: Not on file  Occupational History   Occupation: Retired  Tobacco Use   Smoking status: Never   Smokeless tobacco: Never  Vaping Use   Vaping Use: Never used  Substance and Sexual Activity   Alcohol use: No   Drug use: No   Sexual activity: Not on file  Other Topics Concern   Not on file  Social History Narrative   Not on file   Social Determinants of Health   Financial Resource Strain: Low Risk    Difficulty of Paying Living Expenses: Not hard at all  Food Insecurity: No Food Insecurity   Worried About Paediatric nurse in the Last Year: Never true   Ran Out of Food in the Last Year: Never true  Transportation Needs: No Transportation Needs   Lack of Transportation (Medical): No   Lack of Transportation (Non-Medical): No  Physical Activity: Sufficiently Active   Days of Exercise per Week: 3 days   Minutes of Exercise per Session: 60 min  Stress: No Stress Concern Present   Feeling of Stress : Not at all  Social Connections: Socially Integrated   Frequency of Communication with Friends and Family: Twice a week   Frequency of Social Gatherings with Friends and Family: More than three times a week   Attends Religious Services: More than 4 times per year   Active Member of [employer] or Organizations: Yes   Attends Archivist Meetings: 1 to 4 times per year   Marital Status: Married    Family History  Problem Relation Age of Onset   Stroke Father     Outpatient Encounter Medications as of 01/12/2021  Medication Sig   amLODipine (NORVASC) 10 MG tablet TAKE ONE TABLET BY  MOUTH DAILY   glipiZIDE (GLUCOTROL XL) 5 MG 24 hr tablet Take 2 tablets (10 mg total) by mouth daily with breakfast.   latanoprost (XALATAN) 0.005 % ophthalmic solution 1 drop at bedtime.   metoprolol succinate (TOPROL-XL) 25 MG 24 hr tablet TAKE 1 TABLET BY MOUTH EVERY DAY.   MYRBETRIQ 25 MG TB24 tablet TAKE ONE TABLET BY MOUTH ONCE DAILY.   rosuvastatin (CRESTOR) 5 MG tablet TAKE ONE TABLET BY MOUTH ONCE DAILY.   sitaGLIPtin (JANUVIA) 50 MG tablet Take 1 tablet (50 mg total) by mouth daily.   VYZULTA 0.024 % SOLN Apply 1 drop to eye at bedtime.   lisinopril (ZESTRIL) 20 MG tablet TAKE ONE TABLET BY MOUTH ONCE DAILY. (Patient not taking: Reported on 01/12/2021)   sitaGLIPtin (JANUVIA) 50 MG tablet Take by mouth. (Patient not taking: Reported on 01/12/2021)   No facility-administered encounter medications on file as of 01/12/2021.    ALLERGIES: No Known Allergies  VACCINATION STATUS: Immunization History   Administered Date(s) Administered   Fluad Quad(high Dose 65+) 02/14/2020   Influenza, High Dose Seasonal PF 03/23/2018   Influenza,inj,Quad PF,6+ Mos 01/30/2016, 01/29/2017, 01/28/2019   Moderna SARS-COV2 Booster Vaccination 03/16/2020   Moderna Sars-Covid-2 Vaccination 06/05/2019, 07/06/2019   Pneumococcal Conjugate-13 04/06/2020   Pneumococcal Polysaccharide-23 01/28/2019    Diabetes He presents for his follow-up diabetic visit. He has type 2 diabetes mellitus. Onset time: He was diagnosed at approximate age of 86 years. His disease course has been stable. There are no hypoglycemic associated symptoms. Pertinent negatives for hypoglycemia include no confusion, headaches, pallor or seizures. There are no diabetic associated symptoms. Pertinent negatives for diabetes include no chest pain, no fatigue, no polydipsia, no polyphagia, no polyuria and no weakness. There are no hypoglycemic complications. Symptoms are stable. Diabetic complications include nephropathy. Risk factors for coronary artery disease include dyslipidemia, diabetes mellitus, hypertension, male sex and sedentary lifestyle. Current diabetic treatment includes oral agent (dual therapy). He is compliant with treatment most of the time. His weight is fluctuating minimally. He is following a generally healthy diet. When asked about meal planning, he reported none. He has not had a previous visit with a dietitian. He rarely participates in exercise. His home blood glucose trend is fluctuating minimally. His breakfast blood glucose range is generally 180-200 mg/dl. His overall blood glucose range is 180-200 mg/dl. (He presents today with his logs, no meter, showing slightly above target fasting glycemic profile.  His POCT A1c today is 8.4%, essentially unchanged from previous visit of 8.3%.  He does admit to drinking mostly gingerale and sometimes eating sweet deserts.  He does continue to work out 3 days per week.  He was concerned about his  recent trend in glucose readings.  He denies any recent infections, exposure to steroids.) An ACE inhibitor/angiotensin II receptor blocker is being taken. He does not see a podiatrist.Eye exam is current.  Hyperlipidemia This is a chronic problem. The current episode started more than 1 year ago. The problem is controlled. Recent lipid tests were reviewed and are normal. Exacerbating diseases include chronic renal disease and diabetes. Factors aggravating his hyperlipidemia include beta blockers. Pertinent negatives include no chest pain, myalgias or shortness of breath. Current antihyperlipidemic treatment includes statins. The current treatment provides mild improvement of lipids. There are no compliance problems.  Risk factors for coronary artery disease include dyslipidemia, diabetes mellitus, hypertension, male sex and a sedentary lifestyle.  Hypertension This is a chronic problem. The current episode started more than 1 year ago. The  problem has been gradually improving since onset. The problem is controlled. Pertinent negatives include no chest pain, headaches, neck pain, palpitations or shortness of breath. There are no associated agents to hypertension. Risk factors for coronary artery disease include diabetes mellitus, dyslipidemia, male gender and sedentary lifestyle. Past treatments include ACE inhibitors, beta blockers and calcium channel blockers. The current treatment provides significant improvement. There are no compliance problems.  Hypertensive end-organ damage includes kidney disease. Identifiable causes of hypertension include chronic renal disease.    Review of systems  Constitutional: + Minimally fluctuating body weight,  current Body mass index is 25.71 kg/m. , no fatigue, no subjective hyperthermia, no subjective hypothermia Eyes: no blurry vision, no xerophthalmia ENT: no sore throat, no nodules palpated in throat, no dysphagia/odynophagia, no hoarseness Cardiovascular: no  chest pain, no shortness of breath, no palpitations, no leg swelling Respiratory: no cough, no shortness of breath Gastrointestinal: no nausea/vomiting/diarrhea Musculoskeletal: no muscle/joint aches Skin: no rashes, no hyperemia Neurological: no tremors, no numbness, no tingling, no dizziness Psychiatric: no depression, no anxiety   Objective:    BP 137/65   Pulse (!) 58   Ht 6' (1.829 m)   Wt 189 lb 9.6 oz (86 kg)   BMI 25.71 kg/m   Wt Readings from Last 3 Encounters:  01/12/21 189 lb 9.6 oz (86 kg)  10/05/20 191 lb 12.8 oz (87 kg)  08/21/20 191 lb 12.8 oz (87 kg)    BP Readings from Last 3 Encounters:  01/12/21 137/65  10/05/20 131/68  08/21/20 138/60     Physical Exam- Limited  Constitutional:  Body mass index is 25.71 kg/m. , not in acute distress, normal state of mind Eyes:  EOMI, no exophthalmos Neck: Supple Cardiovascular: RRR, no murmurs, rubs, or gallops, no edema Respiratory: Adequate breathing efforts, no crackles, rales, rhonchi, or wheezing Musculoskeletal: no gross deformities, strength intact in all four extremities, no gross restriction of joint movements Skin:  no rashes, no hyperemia Neurological: no tremor with outstretched hands    CMP     Component Value Date/Time   NA 137 08/14/2020 1308   NA 140 05/14/2019 0000   K 4.5 08/14/2020 1308   CL 103 08/14/2020 1308   CO2 26 08/14/2020 1308   GLUCOSE 169 (H) 08/14/2020 1308   BUN 25 (H) 08/14/2020 1308   BUN 26 (A) 05/14/2019 0000   CREATININE 2.12 (H) 08/14/2020 1308   CREATININE 2.23 (H) 04/06/2020 1206   CALCIUM 9.1 08/14/2020 1308   PROT 8.0 08/14/2020 1308   ALBUMIN 4.3 08/14/2020 1308   AST 23 08/14/2020 1308   ALT 24 08/14/2020 1308   ALKPHOS 48 08/14/2020 1308   BILITOT 0.6 08/14/2020 1308   GFRNONAA 30 (L) 08/14/2020 1308   GFRAA 29 (L) 07/29/2019 1115    Diabetic Labs (most recent): Lab Results  Component Value Date   HGBA1C 8.4 (A) 01/12/2021   HGBA1C 8.3 (A)  10/05/2020   HGBA1C 7.6 (A) 04/04/2020     Lipid Panel ( most recent) Lipid Panel     Component Value Date/Time   CHOL 120 04/06/2020 1206   TRIG 101 04/06/2020 1206   HDL 43 04/06/2020 1206   CHOLHDL 2.8 04/06/2020 1206   LDLCALC 59 04/06/2020 1206      Lab Results  Component Value Date   TSH 0.49 04/06/2020   TSH 1.48 05/05/2017      Assessment & Plan:   1) Type 2 diabetes mellitus with diabetic nephropathy, without long-term current use of insulin (HCC)  -  Seymore Brodowski has currently uncontrolled symptomatic type 2 DM since 86 years of age.  He presents today with his logs, no meter, showing slightly above target fasting glycemic profile.  His POCT A1c today is 8.4%, essentially unchanged from previous visit of 8.3%.  He does admit to drinking mostly gingerale and sometimes eating sweet deserts.  He does continue to work out 3 days per week.  He was concerned about his recent trend in glucose readings.  He denies any recent infections, exposure to steroids.  He denies any hypoglycemia.  - Recent labs reviewed.  - I had a long discussion with him about the progressive nature of diabetes and the pathology behind its complications. -his diabetes is complicated by CKD and he remains at a high risk for more acute and chronic complications which include CAD, CVA, CKD, retinopathy, and neuropathy. These are all discussed in detail with him.  - Nutritional counseling repeated at each appointment due to patients tendency to fall back in to old habits.  - The patient admits there is a room for improvement in their diet and drink choices. -  Suggestion is made for the patient to avoid simple carbohydrates from their diet including Cakes, Sweet Desserts / Pastries, Ice Cream, Soda (diet and regular), Sweet Tea, Candies, Chips, Cookies, Sweet Pastries, Store Bought Juices, Alcohol in Excess of 1-2 drinks a day, Artificial Sweeteners, Coffee Creamer, and "Sugar-free" Products. This  will help patient to have stable blood glucose profile and potentially avoid unintended weight gain.   - I encouraged the patient to switch to unprocessed or minimally processed complex starch and increased protein intake (animal or plant source), fruits, and vegetables.   - Patient is advised to stick to a routine mealtimes to eat 3 meals a day and avoid unnecessary snacks (to snack only to correct hypoglycemia).  - he sees Jearld Fenton, RDE routinely for diabetes education and meal planning.  - I have approached him with the following individualized plan to manage  his diabetes and patient agrees:   -Although his fasting glycemic profile is slightly above target, no changes will be made to his medications today.  He is advised to continue Januvia 50 mg po daily and Glipizide 10 mg XL daily with breakfast.  He is advised to reduce his carb consumption at each meal which will help with his glucose readings.  The next option for diabetes management would be basal insulin if we cannot get his diabetes under control with these 2 medications alone due to his renal impairment.  -He is encouraged to continue monitoring blood glucose once daily, before breakfast and to call the clinic if he has readings less than 70 or greater than 200 for 3 tests in a row.  - he is not a candidate for metformin, SGLT2 inhibitors, or full dose Januvia due to concurrent renal insufficiency.  - Specific targets for  A1c;  LDL, HDL,  and Triglycerides were discussed with the patient.  2) Blood Pressure /Hypertension:  His blood pressure is controlled to target  He is advised to continue Metoprolol 25 mg p.o. daily, and Amlodipine 10 mg p.o. daily.  3) Lipids/Hyperlipidemia:   His most recent lipid panel from 04/06/20 shows controlled LDL of 59.  He is advised to continue Crestor 5 mg po daily at bedtime.  Side effects and precautions discussed with him.   4)  Weight/Diet: His Body mass index is 25.71 kg/m.  -  he is  not a candidate for weight loss.  Exercise, and detailed carbohydrates information provided  -  detailed on discharge instructions.  5) Chronic Care/Health Maintenance: -he  is on ACEI/ARB and Statin medications and is encouraged to initiate and continue to follow up with Ophthalmology, Dentist, Nephrologist, and Podiatrist at least yearly or according to recommendations, and advised to stay away from smoking. I have recommended yearly flu vaccine and pneumonia vaccine at least every 5 years; moderate intensity exercise for up to 150 minutes weekly; and  sleep for at least 7 hours a day.  - he is  advised to maintain close follow up with Vivi Barrack, MD for primary care needs, as well as his other providers for optimal and coordinated care.       I spent 30 minutes in the care of the patient today including review of labs from [facility], Lipids, Thyroid Function, Hematology (current and previous including abstractions from other facilities); face-to-face time discussing  his blood glucose readings/logs, discussing hypoglycemia and hyperglycemia episodes and symptoms, medications doses, his options of short and long term treatment based on the latest standards of care / guidelines;  discussion about incorporating lifestyle medicine;  and documenting the encounter.    Please refer to Patient Instructions for Blood Glucose Monitoring and Insulin/Medications Dosing Guide"  in media tab for additional information. Please  also refer to " Patient Self Inventory" in the Media  tab for reviewed elements of pertinent patient history.  Donne Hazel participated in the discussions, expressed understanding, and voiced agreement with the above plans.  All questions were answered to his satisfaction. he is encouraged to contact clinic should he have any questions or concerns prior to his return visit.   Follow up plan: - Return in about 4 months (around 05/14/2021) for Diabetes F/U with A1c in office, No  previsit labs, Bring meter and logs.  Rayetta Pigg, [facility] [address] [place], [place] [address] Phone: [phone] Fax: [phone]   01/12/2021, 10:02 AM

## 2021-02-05 ENCOUNTER — Ambulatory Visit: Payer: Medicare Other | Admitting: Nurse Practitioner

## 2021-02-06 ENCOUNTER — Other Ambulatory Visit: Payer: Self-pay | Admitting: Family Medicine

## 2021-02-13 ENCOUNTER — Other Ambulatory Visit: Payer: Self-pay

## 2021-02-13 ENCOUNTER — Inpatient Hospital Stay (HOSPITAL_COMMUNITY): Payer: Medicare Other | Attending: Hematology

## 2021-02-13 DIAGNOSIS — Z23 Encounter for immunization: Secondary | ICD-10-CM | POA: Insufficient documentation

## 2021-02-13 DIAGNOSIS — C61 Malignant neoplasm of prostate: Secondary | ICD-10-CM | POA: Diagnosis not present

## 2021-02-13 DIAGNOSIS — D649 Anemia, unspecified: Secondary | ICD-10-CM | POA: Insufficient documentation

## 2021-02-13 LAB — CBC WITH DIFFERENTIAL/PLATELET
Abs Immature Granulocytes: 0.04 10*3/uL (ref 0.00–0.07)
Basophils Absolute: 0.1 10*3/uL (ref 0.0–0.1)
Basophils Relative: 1 %
Eosinophils Absolute: 0.2 10*3/uL (ref 0.0–0.5)
Eosinophils Relative: 3 %
HCT: 38 % — ABNORMAL LOW (ref 39.0–52.0)
Hemoglobin: 13.2 g/dL (ref 13.0–17.0)
Immature Granulocytes: 1 %
Lymphocytes Relative: 21 %
Lymphs Abs: 1.2 10*3/uL (ref 0.7–4.0)
MCH: 27.7 pg (ref 26.0–34.0)
MCHC: 34.7 g/dL (ref 30.0–36.0)
MCV: 79.7 fL — ABNORMAL LOW (ref 80.0–100.0)
Monocytes Absolute: 0.4 10*3/uL (ref 0.1–1.0)
Monocytes Relative: 7 %
Neutro Abs: 3.7 10*3/uL (ref 1.7–7.7)
Neutrophils Relative %: 67 %
Platelets: 163 10*3/uL (ref 150–400)
RBC: 4.77 MIL/uL (ref 4.22–5.81)
RDW: 13.1 % (ref 11.5–15.5)
WBC: 5.6 10*3/uL (ref 4.0–10.5)
nRBC: 0 % (ref 0.0–0.2)

## 2021-02-13 LAB — COMPREHENSIVE METABOLIC PANEL
ALT: 27 U/L (ref 0–44)
AST: 24 U/L (ref 15–41)
Albumin: 4.4 g/dL (ref 3.5–5.0)
Alkaline Phosphatase: 65 U/L (ref 38–126)
Anion gap: 7 (ref 5–15)
BUN: 28 mg/dL — ABNORMAL HIGH (ref 8–23)
CO2: 26 mmol/L (ref 22–32)
Calcium: 9.2 mg/dL (ref 8.9–10.3)
Chloride: 103 mmol/L (ref 98–111)
Creatinine, Ser: 2.19 mg/dL — ABNORMAL HIGH (ref 0.61–1.24)
GFR, Estimated: 29 mL/min — ABNORMAL LOW (ref >60)
Glucose, Bld: 234 mg/dL — ABNORMAL HIGH (ref 70–99)
Potassium: 4.2 mmol/L (ref 3.5–5.1)
Sodium: 136 mmol/L (ref 135–145)
Total Bilirubin: 0.7 mg/dL (ref 0.3–1.2)
Total Protein: 8.2 g/dL — ABNORMAL HIGH (ref 6.5–8.1)

## 2021-02-13 LAB — IRON AND TIBC
Iron: 82 ug/dL (ref 45–182)
Saturation Ratios: 25 % (ref 17.9–39.5)
TIBC: 326 ug/dL (ref 250–450)
UIBC: 244 ug/dL

## 2021-02-13 LAB — VITAMIN B12: Vitamin B-12: 693 pg/mL (ref 180–914)

## 2021-02-13 LAB — FOLATE: Folate: 12.4 ng/mL (ref >5.9)

## 2021-02-13 LAB — LACTATE DEHYDROGENASE: LDH: 185 U/L (ref 98–192)

## 2021-02-13 LAB — VITAMIN D 25 HYDROXY (VIT D DEFICIENCY, FRACTURES): Vit D, 25-Hydroxy: 35.05 ng/mL (ref 30–100)

## 2021-02-13 LAB — FERRITIN: Ferritin: 109 ng/mL (ref 24–336)

## 2021-02-13 LAB — PSA: Prostatic Specific Antigen: <0.01 ng/mL (ref 0.00–4.00)

## 2021-02-19 NOTE — Progress Notes (Signed)
[place], [place] [address]   CLINIC:  Medical Oncology/Hematology  PCP:  Vivi Barrack, MD [address] / [place] [address] [phone]   REASON FOR VISIT:  Follow-up for prostate cancer  PRIOR THERAPY:  1. Radical prostatectomy in 06/2004. 2. Radiation from 08/01/2010 to 09/14/2010.  NGS Results: not done  CURRENT THERAPY: surveillance  BRIEF ONCOLOGIC HISTORY:  Oncology History   No history exists.    CANCER STAGING: Cancer Staging No matching staging information was found for the patient.  INTERVAL HISTORY:  Richard Swanson, a 86 y.o. male, returns for routine follow-up of his prostate cancer. Richard Swanson was last seen on 08/21/2020.   Today he reports feeling good. He denies fatigue, ankle swellings, hematochezia, hematuria, and black stools. His appetite is good.   REVIEW OF SYSTEMS:  Review of Systems  Constitutional:  Negative for appetite change and fatigue (80%).  Cardiovascular:  Negative for leg swelling.  Gastrointestinal:  Negative for blood in stool.  Genitourinary:  Negative for hematuria.   All other systems reviewed and are negative.  PAST MEDICAL/SURGICAL HISTORY:  Past Medical History:  Diagnosis Date   Cancer [facility])    Prostate   Chronic obstructive pulmonary disease, unspecified ([place])    Diabetes mellitus without complication (HCC)    Dizziness    Gastro-esophageal reflux disease without esophagitis    Glaucoma    Glomerular disorders in diseases classified elsewhere    Gross hematuria    Hypertension    Hypertensive chronic kidney disease with stage 1 through stage 4 chronic kidney disease, or unspecified chronic kidney disease    Kidney disease, chronic, stage III (moderate, EGFR 30-59 ml/min) (HCC)    Malignant neoplasm of prostate ([place])    Prostate cancer ([place]) 11/03/2010   dx 2006   Type 2 diabetes mellitus with diabetic nephropathy (HCC)    Unspecified kidney failure    Past  Surgical History:  Procedure Laterality Date   PROSTATE SURGERY      SOCIAL HISTORY:  Social History   Socioeconomic History   Marital status: Married    Spouse name: Not on file   Number of children: Not on file   Years of education: Not on file   Highest education level: Not on file  Occupational History   Occupation: Retired  Tobacco Use   Smoking status: Never   Smokeless tobacco: Never  Vaping Use   Vaping Use: Never used  Substance and Sexual Activity   Alcohol use: No   Drug use: No   Sexual activity: Not on file  Other Topics Concern   Not on file  Social History Narrative   Not on file   Social Determinants of Health   Financial Resource Strain: Low Risk    Difficulty of Paying Living Expenses: Not hard at all  Food Insecurity: No Food Insecurity   Worried About Charity fundraiser in the Last Year: Never true   Ran Out of Food in the Last Year: Never true  Transportation Needs: No Transportation Needs   Lack of Transportation (Medical): No   Lack of Transportation (Non-Medical): No  Physical Activity: Sufficiently Active   Days of Exercise per Week: 3 days   Minutes of Exercise per Session: 60 min  Stress: No Stress Concern Present   Feeling of Stress : Not at all  Social Connections: Socially Integrated   Frequency of Communication with Friends and Family: Twice a week   Frequency of Social  Gatherings with Friends and Family: More than three times a week   Attends Religious Services: More than 4 times per year   Active Member of Clubs or Organizations: Yes   Attends Archivist Meetings: 1 to 4 times per year   Marital Status: Married  Human resources officer Violence: Not At Risk   Fear of Current or Ex-Partner: No   Emotionally Abused: No   Physically Abused: No   Sexually Abused: No    FAMILY HISTORY:  Family History  Problem Relation Age of Onset   Stroke Father     CURRENT MEDICATIONS:  Current Outpatient Medications  Medication Sig  Dispense Refill   amLODipine (NORVASC) 10 MG tablet TAKE ONE TABLET BY MOUTH DAILY 90 tablet 0   glipiZIDE (GLUCOTROL XL) 5 MG 24 hr tablet Take 2 tablets (10 mg total) by mouth daily with breakfast. 180 tablet 3   latanoprost (XALATAN) 0.005 % ophthalmic solution 1 drop at bedtime.     metoprolol succinate (TOPROL-XL) 25 MG 24 hr tablet TAKE 1 TABLET BY MOUTH EVERY DAY. 90 tablet 0   MYRBETRIQ 25 MG TB24 tablet TAKE ONE TABLET BY MOUTH ONCE DAILY. 30 tablet 2   rosuvastatin (CRESTOR) 5 MG tablet TAKE ONE TABLET BY MOUTH ONCE DAILY. 90 tablet 0   sitaGLIPtin (JANUVIA) 50 MG tablet Take 1 tablet (50 mg total) by mouth daily. 90 tablet 0   VYZULTA 0.024 % SOLN Apply 1 drop to eye at bedtime.     No current facility-administered medications for this visit.    ALLERGIES:  No Known Allergies  PHYSICAL EXAM:  Performance status (ECOG): 1 - Symptomatic but completely ambulatory  There were no vitals filed for this visit. Wt Readings from Last 3 Encounters:  01/12/21 189 lb 9.6 oz (86 kg)  10/05/20 191 lb 12.8 oz (87 kg)  08/21/20 191 lb 12.8 oz (87 kg)   Physical Exam Vitals reviewed.  Constitutional:      Appearance: Normal appearance.  Cardiovascular:     Rate and Rhythm: Normal rate and regular rhythm.     Pulses: Normal pulses.     Heart sounds: Normal heart sounds.  Pulmonary:     Effort: Pulmonary effort is normal.     Breath sounds: Normal breath sounds.  Neurological:     General: No focal deficit present.     Mental Status: He is alert and oriented to person, place, and time.  Psychiatric:        Mood and Affect: Mood normal.        Behavior: Behavior normal.     LABORATORY DATA:  I have reviewed the labs as listed.  CBC Latest Ref Rng & Units 02/13/2021 08/14/2020 04/06/2020  WBC 4.0 - 10.5 K/uL 5.6 5.2 5.2  Hemoglobin 13.0 - 17.0 g/dL 13.2 13.1 11.8(L)  Hematocrit 39.0 - 52.0 % 38.0(L) 38.4(L) 34.9(L)  Platelets 150 - 400 K/uL 163 154 147   CMP Latest Ref Rng &  Units 02/13/2021 08/14/2020 04/06/2020  Glucose 70 - 99 mg/dL 234(H) 169(H) 210(H)  BUN 8 - 23 mg/dL 28(H) 25(H) 27(H)  Creatinine 0.61 - 1.24 mg/dL 2.19(H) 2.12(H) 2.23(H)  Sodium 135 - 145 mmol/L 136 137 139  Potassium 3.5 - 5.1 mmol/L 4.2 4.5 4.4  Chloride 98 - 111 mmol/L 103 103 105  CO2 22 - 32 mmol/L 26 26 28   Calcium 8.9 - 10.3 mg/dL 9.2 9.1 9.2  Total Protein 6.5 - 8.1 g/dL 8.2(H) 8.0 7.0  Total Bilirubin 0.3 -  1.2 mg/dL 0.7 0.6 0.6  Alkaline Phos 38 - 126 U/L 65 48 -  AST 15 - 41 U/L 24 23 19   ALT 0 - 44 U/L 27 24 23     DIAGNOSTIC IMAGING:  I have independently reviewed the scans and discussed with the patient. No results found.   ASSESSMENT:  1.  Prostate cancer: -Gleason score 5, status post radical prostatectomy in March 2006 by Dr. Michela Pitcher. -Status post radiation by Dr. Valere Dross on 08/01/2010 through 09/14/2010   2.  CKD: -Followed by Dr. Theador Hawthorne.   PLAN:  1.  Prostate cancer: - He does not have any new onset pains. - PSA is undetectable.   2.  Normocytic anemia: - Mild CKD and relative iron deficiency. - Hemoglobin is 13.2.  MCV is slightly low at 79.  Y81 and folic acid were normal. - Recommend follow-up in 1 year. - Blood sugar is elevated at 234.  Recommend follow-up with endocrinology.    Orders placed this encounter:  No orders of the defined types were placed in this encounter.    Derek Jack, MD [place] [phone]   I, Thana Ates, am acting as a scribe for Dr. Derek Jack.  I, Derek Jack MD, have reviewed the above documentation for accuracy and completeness, and I agree with the above.

## 2021-02-20 ENCOUNTER — Inpatient Hospital Stay (HOSPITAL_BASED_OUTPATIENT_CLINIC_OR_DEPARTMENT_OTHER): Payer: Medicare Other | Admitting: Hematology

## 2021-02-20 ENCOUNTER — Other Ambulatory Visit: Payer: Self-pay

## 2021-02-20 VITALS — BP 151/75 | HR 54 | Temp 98.0°F | Resp 18 | Wt 192.9 lb

## 2021-02-20 DIAGNOSIS — D649 Anemia, unspecified: Secondary | ICD-10-CM | POA: Diagnosis not present

## 2021-02-20 DIAGNOSIS — C61 Malignant neoplasm of prostate: Secondary | ICD-10-CM | POA: Diagnosis not present

## 2021-02-20 DIAGNOSIS — Z23 Encounter for immunization: Secondary | ICD-10-CM

## 2021-02-20 MED ORDER — INFLUENZA VAC A&B SA ADJ QUAD 0.5 ML IM PRSY
0.5000 mL | PREFILLED_SYRINGE | Freq: Once | INTRAMUSCULAR | Status: AC
  Administered 2021-02-20: 0.5 mL via INTRAMUSCULAR
  Filled 2021-02-20: qty 0.5

## 2021-02-20 NOTE — Progress Notes (Signed)
Patient come in for office visit. Flu vaccine ordered, See MAR for administration information. Patient remained stable throughout injection. Patient was discharged from clinic ambulatory and in stable condition.

## 2021-02-20 NOTE — Patient Instructions (Signed)
[place] at [facility] Discharge Instructions  You were seen and examined today by Dr. Delton Coombes. He reviewed your most recent labs and everything looks good except for your kidney numbers and your sugar is elevated. Please follow up with Dr. Dorris Fetch or your primary care concerning this. Please keep follow up appointment as scheduled.   Thank you for choosing [place] at [facility] to provide your oncology and hematology care.  To afford each patient quality time with our provider, please arrive at least 15 minutes before your scheduled appointment time.   If you have a lab appointment with the [facility] please come in thru the Main Entrance and check in at the main information desk.  You need to re-schedule your appointment should you arrive 10 or more minutes late.  We strive to give you quality time with our providers, and arriving late affects you and other patients whose appointments are after yours.  Also, if you no show three or more times for appointments you may be dismissed from the clinic at the providers discretion.     Again, thank you for choosing [facility].  Our hope is that these requests will decrease the amount of time that you wait before being seen by our physicians.       _____________________________________________________________  Should you have questions after your visit to [facility], please contact our office at [phone] and follow the prompts.  Our office hours are 8:00 a.m. and 4:30 p.m. Monday - Friday.  Please note that voicemails left after 4:00 p.m. may not be returned until the following business day.  We are closed weekends and major holidays.  You do have access to a nurse 24-7, just call the main number to the clinic [phone] and do not press any options, hold on the line and a nurse will answer the phone.    For prescription refill requests, have your pharmacy contact  our office and allow 72 hours.    Due to Covid, you will need to wear a mask upon entering the hospital. If you do not have a mask, a mask will be given to you at the Main Entrance upon arrival. For doctor visits, patients may have 1 support person age 25 or older with them. For treatment visits, patients can not have anyone with them due to social distancing guidelines and our immunocompromised population.

## 2021-02-22 ENCOUNTER — Other Ambulatory Visit: Payer: Self-pay | Admitting: Family Medicine

## 2021-02-22 DIAGNOSIS — I10 Essential (primary) hypertension: Secondary | ICD-10-CM

## 2021-03-05 DIAGNOSIS — R809 Proteinuria, unspecified: Secondary | ICD-10-CM | POA: Diagnosis not present

## 2021-03-05 DIAGNOSIS — N189 Chronic kidney disease, unspecified: Secondary | ICD-10-CM | POA: Diagnosis not present

## 2021-03-05 DIAGNOSIS — E1122 Type 2 diabetes mellitus with diabetic chronic kidney disease: Secondary | ICD-10-CM | POA: Diagnosis not present

## 2021-03-05 DIAGNOSIS — N170 Acute kidney failure with tubular necrosis: Secondary | ICD-10-CM | POA: Diagnosis not present

## 2021-03-05 DIAGNOSIS — D631 Anemia in chronic kidney disease: Secondary | ICD-10-CM | POA: Diagnosis not present

## 2021-03-09 DIAGNOSIS — E1122 Type 2 diabetes mellitus with diabetic chronic kidney disease: Secondary | ICD-10-CM | POA: Diagnosis not present

## 2021-03-09 DIAGNOSIS — N189 Chronic kidney disease, unspecified: Secondary | ICD-10-CM | POA: Diagnosis not present

## 2021-03-09 DIAGNOSIS — E1129 Type 2 diabetes mellitus with other diabetic kidney complication: Secondary | ICD-10-CM | POA: Diagnosis not present

## 2021-03-09 DIAGNOSIS — R809 Proteinuria, unspecified: Secondary | ICD-10-CM | POA: Diagnosis not present

## 2021-03-09 DIAGNOSIS — D631 Anemia in chronic kidney disease: Secondary | ICD-10-CM | POA: Diagnosis not present

## 2021-03-09 DIAGNOSIS — I129 Hypertensive chronic kidney disease with stage 1 through stage 4 chronic kidney disease, or unspecified chronic kidney disease: Secondary | ICD-10-CM | POA: Diagnosis not present

## 2021-03-13 ENCOUNTER — Other Ambulatory Visit: Payer: Self-pay | Admitting: Nurse Practitioner

## 2021-04-16 ENCOUNTER — Ambulatory Visit (INDEPENDENT_AMBULATORY_CARE_PROVIDER_SITE_OTHER): Payer: Medicare Other

## 2021-04-16 ENCOUNTER — Other Ambulatory Visit: Payer: Self-pay | Admitting: "Endocrinology

## 2021-04-16 VITALS — BP 138/74 | HR 72 | Temp 97.0°F | Wt 191.4 lb

## 2021-04-16 DIAGNOSIS — Z0000 Encounter for general adult medical examination without abnormal findings: Secondary | ICD-10-CM | POA: Diagnosis not present

## 2021-04-16 DIAGNOSIS — E782 Mixed hyperlipidemia: Secondary | ICD-10-CM

## 2021-04-16 NOTE — Patient Instructions (Signed)
Richard Swanson , Thank you for taking time to come for your Medicare Wellness Visit. I appreciate your ongoing commitment to your health goals. Please review the following plan we discussed and let me know if I can assist you in the future.   Screening recommendations/referrals: Colonoscopy: No longer required  Recommended yearly ophthalmology/optometry visit for glaucoma screening and checkup Recommended yearly dental visit for hygiene and checkup  Vaccinations: Influenza vaccine: Done 02/20/21 Pneumococcal vaccine: Up to date Tdap vaccine: declined and discussed  Shingles vaccine: Shingrix discussed. Please contact your pharmacy for coverage information.    Covid-19: Completed 2/6, 3/9, & 03/16/20  Advanced directives: Please bring a copy of your health care power of attorney and living will to the office at your convenience.  Conditions/risks identified: maintain diabetes and diet  Next appointment: Follow up in one year for your annual wellness visit.   Preventive Care 84 Years and Older, Male Preventive care refers to lifestyle choices and visits with your health care provider that can promote health and wellness. What does preventive care include? A yearly physical exam. This is also called an annual well check. Dental exams once or twice a year. Routine eye exams. Ask your health care provider how often you should have your eyes checked. Personal lifestyle choices, including: Daily care of your teeth and gums. Regular physical activity. Eating a healthy diet. Avoiding tobacco and drug use. Limiting alcohol use. Practicing safe sex. Taking low doses of aspirin every day. Taking vitamin and mineral supplements as recommended by your health care provider. What happens during an annual well check? The services and screenings done by your health care provider during your annual well check will depend on your age, overall health, lifestyle risk factors, and family history of  disease. Counseling  Your health care provider may ask you questions about your: Alcohol use. Tobacco use. Drug use. Emotional well-being. Home and relationship well-being. Sexual activity. Eating habits. History of falls. Memory and ability to understand (cognition). Work and work Statistician. Screening  You may have the following tests or measurements: Height, weight, and BMI. Blood pressure. Lipid and cholesterol levels. These may be checked every 5 years, or more frequently if you are over 12 years old. Skin check. Lung cancer screening. You may have this screening every year starting at age 36 if you have a 30-pack-year history of smoking and currently smoke or have quit within the past 15 years. Fecal occult blood test (FOBT) of the stool. You may have this test every year starting at age 60. Flexible sigmoidoscopy or colonoscopy. You may have a sigmoidoscopy every 5 years or a colonoscopy every 10 years starting at age 52. Prostate cancer screening. Recommendations will vary depending on your family history and other risks. Hepatitis C blood test. Hepatitis B blood test. Sexually transmitted disease (STD) testing. Diabetes screening. This is done by checking your blood sugar (glucose) after you have not eaten for a while (fasting). You may have this done every 1-3 years. Abdominal aortic aneurysm (AAA) screening. You may need this if you are a current or former smoker. Osteoporosis. You may be screened starting at age 17 if you are at high risk. Talk with your health care provider about your test results, treatment options, and if necessary, the need for more tests. Vaccines  Your health care provider may recommend certain vaccines, such as: Influenza vaccine. This is recommended every year. Tetanus, diphtheria, and acellular pertussis (Tdap, Td) vaccine. You may need a Td booster every 10 years.  Zoster vaccine. You may need this after age 8. Pneumococcal 13-valent  conjugate (PCV13) vaccine. One dose is recommended after age 44. Pneumococcal polysaccharide (PPSV23) vaccine. One dose is recommended after age 59. Talk to your health care provider about which screenings and vaccines you need and how often you need them. This information is not intended to replace advice given to you by your health care provider. Make sure you discuss any questions you have with your health care provider. Document Released: 05/12/2015 Document Revised: 01/03/2016 Document Reviewed: 02/14/2015 Elsevier Interactive Patient Education  2017 [place] Prevention in the Home Falls can cause injuries. They can happen to people of all ages. There are many things you can do to make your home safe and to help prevent falls. What can I do on the outside of my home? Regularly fix the edges of walkways and driveways and fix any cracks. Remove anything that might make you trip as you walk through a door, such as a raised step or threshold. Trim any bushes or trees on the path to your home. Use bright outdoor lighting. Clear any walking paths of anything that might make someone trip, such as rocks or tools. Regularly check to see if handrails are loose or broken. Make sure that both sides of any steps have handrails. Any raised decks and porches should have guardrails on the edges. Have any leaves, snow, or ice cleared regularly. Use sand or salt on walking paths during winter. Clean up any spills in your garage right away. This includes oil or grease spills. What can I do in the bathroom? Use night lights. Install grab bars by the toilet and in the tub and shower. Do not use towel bars as grab bars. Use non-skid mats or decals in the tub or shower. If you need to sit down in the shower, use a plastic, non-slip stool. Keep the floor dry. Clean up any water that spills on the floor as soon as it happens. Remove soap buildup in the tub or shower regularly. Attach bath mats  securely with double-sided non-slip rug tape. Do not have throw rugs and other things on the floor that can make you trip. What can I do in the bedroom? Use night lights. Make sure that you have a light by your bed that is easy to reach. Do not use any sheets or blankets that are too big for your bed. They should not hang down onto the floor. Have a firm chair that has side arms. You can use this for support while you get dressed. Do not have throw rugs and other things on the floor that can make you trip. What can I do in the kitchen? Clean up any spills right away. Avoid walking on wet floors. Keep items that you use a lot in easy-to-reach places. If you need to reach something above you, use a strong step stool that has a grab bar. Keep electrical cords out of the way. Do not use floor polish or wax that makes floors slippery. If you must use wax, use non-skid floor wax. Do not have throw rugs and other things on the floor that can make you trip. What can I do with my stairs? Do not leave any items on the stairs. Make sure that there are handrails on both sides of the stairs and use them. Fix handrails that are broken or loose. Make sure that handrails are as long as the stairways. Check any carpeting to make sure that  it is firmly attached to the stairs. Fix any carpet that is loose or worn. Avoid having throw rugs at the top or bottom of the stairs. If you do have throw rugs, attach them to the floor with carpet tape. Make sure that you have a light switch at the top of the stairs and the bottom of the stairs. If you do not have them, ask someone to add them for you. What else can I do to help prevent falls? Wear shoes that: Do not have high heels. Have rubber bottoms. Are comfortable and fit you well. Are closed at the toe. Do not wear sandals. If you use a stepladder: Make sure that it is fully opened. Do not climb a closed stepladder. Make sure that both sides of the stepladder  are locked into place. Ask someone to hold it for you, if possible. Clearly mark and make sure that you can see: Any grab bars or handrails. First and last steps. Where the edge of each step is. Use tools that help you move around (mobility aids) if they are needed. These include: Canes. Walkers. Scooters. Crutches. Turn on the lights when you go into a dark area. Replace any light bulbs as soon as they burn out. Set up your furniture so you have a clear path. Avoid moving your furniture around. If any of your floors are uneven, fix them. If there are any pets around you, be aware of where they are. Review your medicines with your doctor. Some medicines can make you feel dizzy. This can increase your chance of falling. Ask your doctor what other things that you can do to help prevent falls. This information is not intended to replace advice given to you by your health care provider. Make sure you discuss any questions you have with your health care provider. Document Released: 02/09/2009 Document Revised: 09/21/2015 Document Reviewed: 05/20/2014 Elsevier Interactive Patient Education  2017 [employer].

## 2021-04-16 NOTE — Progress Notes (Signed)
Subjective:   Richard Swanson is a 86 y.o. male who presents for Medicare Annual/Subsequent preventive examination.  Review of Systems     Cardiac Risk Factors include: advanced age (>74mn, >>24women);hypertension;dyslipidemia;diabetes mellitus;male gender     Objective:    Today's Vitals   04/16/21 1313 04/16/21 1331  BP: 140/82 138/74  Pulse: 72   Temp: (!) 97 F (36.1 C)   SpO2: 97%   Weight: 191 lb 6.4 oz (86.8 kg)    Body mass index is 25.96 kg/m.  Advanced Directives 04/16/2021 02/20/2021 08/21/2020 04/03/2020 02/14/2020 08/05/2019 02/04/2019  Does Patient Have a Medical Advance Directive? Yes No No No No No No  Type of Advance Directive [place] Chart? No - copy requested - - - - - -  Would patient like information on creating a medical advance directive? - No - Patient declined No - Patient declined Yes (MAU/Ambulatory/Procedural Areas - Information given) No - Patient declined No - Patient declined Yes (MAU/Ambulatory/Procedural Areas - Information given)  Pre-existing out of facility DNR order (yellow form or pink MOST form) - - - - - - -    Current Medications (verified) Outpatient Encounter Medications as of 04/16/2021  Medication Sig   amLODipine (NORVASC) 10 MG tablet TAKE ONE TABLET BY MOUTH DAILY   glipiZIDE (GLUCOTROL XL) 5 MG 24 hr tablet Take 2 tablets (10 mg total) by mouth daily with breakfast.   JANUVIA 50 MG tablet TAKE ONE TABLET BY MOUTH ONCE DAILY.   metoprolol succinate (TOPROL-XL) 25 MG 24 hr tablet TAKE 1 TABLET BY MOUTH EVERY DAY.   MYRBETRIQ 25 MG TB24 tablet TAKE ONE TABLET BY MOUTH ONCE DAILY.   rosuvastatin (CRESTOR) 5 MG tablet TAKE ONE TABLET BY MOUTH ONCE DAILY.   VYZULTA 0.024 % SOLN Apply 1 drop to eye at bedtime.   [DISCONTINUED] latanoprost (XALATAN) 0.005 % ophthalmic solution 1 drop at bedtime. (Patient not taking: Reported on 04/16/2021)   No  facility-administered encounter medications on file as of 04/16/2021.    Allergies (verified) Patient has no known allergies.   History: Past Medical History:  Diagnosis Date   Cancer ([facility]    Prostate   Chronic obstructive pulmonary disease, unspecified ([facility] [place]    Diabetes mellitus without complication (HCC)    Dizziness    Gastro-esophageal reflux disease without esophagitis    Glaucoma    Glomerular disorders in diseases classified elsewhere    Gross hematuria    Hypertension    Hypertensive chronic kidney disease with stage 1 through stage 4 chronic kidney disease, or unspecified chronic kidney disease    Kidney disease, chronic, stage III (moderate, EGFR 30-59 ml/min) (HCC)    [facility]    [place] ([facility] [place] 11/03/2010   dx 2006   Type 2 diabetes mellitus with diabetic nephropathy (HCC)    Unspecified kidney failure    Past Surgical History:  Procedure Laterality Date   PROSTATE SURGERY     Family History  Problem Relation Age of Onset   Stroke Father    Social History   Socioeconomic History   Marital status: Married    Spouse name: Not on file   Number of children: Not on file   Years of education: Not on file   Highest education level: Not on file  Occupational History   Occupation: Retired  Tobacco Use   Smoking status: Never   Smokeless  tobacco: Never  Vaping Use   Vaping Use: Never used  Substance and Sexual Activity   Alcohol use: No   Drug use: No   Sexual activity: Not on file  Other Topics Concern   Not on file  Social History Narrative   Not on file   Social Determinants of Health   Financial Resource Strain: Low Risk    Difficulty of Paying Living Expenses: Not hard at all  Food Insecurity: No Food Insecurity   Worried About Charity fundraiser in the Last Year: Never true   [place] in the Last Year: Never true  Transportation Needs: No Transportation Needs   Lack of Transportation (Medical): No    Lack of Transportation (Non-Medical): No  Physical Activity: Sufficiently Active   Days of Exercise per Week: 3 days   Minutes of Exercise per Session: 60 min  Stress: No Stress Concern Present   Feeling of Stress : Not at all  Social Connections: Socially Integrated   Frequency of Communication with Friends and Family: Once a week   Frequency of Social Gatherings with Friends and Family: Three times a week   Attends Religious Services: More than 4 times per year   Active Member of Clubs or Organizations: Yes   Attends Archivist Meetings: 1 to 4 times per year   Marital Status: Married    Tobacco Counseling Counseling given: Not Answered   Clinical Intake:  Pre-visit preparation completed: Yes  Pain : No/denies pain     BMI - recorded: 25.96 Nutritional Status: BMI 25 -29 Overweight Nutritional Risks: None Diabetes: Yes CBG done?: Yes (161) CBG resulted in Enter/ Edit results?: No Did pt. bring in CBG monitor from home?: No  How often do you need to have someone help you when you read instructions, pamphlets, or other written materials from your doctor or pharmacy?: 1 - Never  Diabetic?Nutrition Risk Assessment:  Has the patient had any N/V/D within the last 2 months?  No  Does the patient have any non-healing wounds?  No  Has the patient had any unintentional weight loss or weight gain?  No   Diabetes:  Is the patient diabetic?  Yes  If diabetic, was a CBG obtained today?  Yes  Did the patient bring in their glucometer from home?  No  How often do you monitor your CBG's? daily.   Financial Strains and Diabetes Management:  Are you having any financial strains with the device, your supplies or your medication? No .  Does the patient want to be seen by Chronic Care Management for management of their diabetes?  No  Would the patient like to be referred to a Nutritionist or for Diabetic Management?  No   Diabetic Exams:  Diabetic Eye Exam: Overdue for  diabetic eye exam. Pt has been advised about the importance in completing this exam. Patient advised to call and schedule an eye exam. Diabetic Foot Exam: Completed 10/05/20   Interpreter Needed?: No  Information entered by :: Charlott Rakes, LPN   Activities of Daily Living In your present state of health, do you have any difficulty performing the following activities: 04/16/2021  Hearing? Y  Vision? N  Difficulty concentrating or making decisions? N  Walking or climbing stairs? N  Dressing or bathing? N  Doing errands, shopping? N  Preparing Food and eating ? N  Using the Toilet? N  In the past six months, have you accidently leaked urine? Y  Comment wears a  pad at times  Do you have problems with loss of bowel control? N  Managing your Medications? N  Managing your Finances? N  Housekeeping or managing your Housekeeping? N  Some recent data might be hidden    Patient Care Team: Vivi Barrack, MD as PCP - General (Family Medicine) Herminio Commons, MD (Inactive) as PCP - Cardiology (Cardiology) Liana Gerold, MD as Consulting Physician (Nephrology)  Indicate any recent Medical Services you may have received from other than [facility] providers in the past year (date may be approximate).     Assessment:   This is a routine wellness examination for [place].  Hearing/Vision screen Hearing Screening - Comments:: Pt declines/ has tinnitus  and wife disputes  Vision Screening - Comments:: Pt [facility]   Dietary issues and exercise activities discussed: Current Exercise Habits: Home exercise routine, Type of exercise: walking;treadmill;Other - see comments, Time (Minutes): 60, Frequency (Times/Week): 3, Weekly Exercise (Minutes/Week): 180   Goals Addressed             This Visit's Progress    Patient Stated       Maintain Diabetes and diet        Depression Screen PHQ 2/9 Scores 04/16/2021 04/03/2020 11/16/2019 08/19/2019 05/31/2019  05/25/2019 05/10/2019  PHQ - 2 Score 0 0 0 0 0 0 0  Exception Documentation - - - - - Medical reason Medical reason    Fall Risk Fall Risk  04/16/2021 04/03/2020 11/30/2019 11/16/2019 08/23/2019  Falls in the past year? 0 0 0 0 0  Number falls in past yr: 0 0 0 - 0  Injury with Fall? 0 0 0 - 0  Risk for fall due to : Impaired vision Impaired vision - - -  Follow up Falls prevention discussed Falls prevention discussed Falls prevention discussed - -    FALL RISK PREVENTION PERTAINING TO THE HOME:  Any stairs in or around the home? No  If so, are there any without handrails? No  Home free of loose throw rugs in walkways, pet beds, electrical cords, etc? Yes  Adequate lighting in your home to reduce risk of falls? Yes   ASSISTIVE DEVICES UTILIZED TO PREVENT FALLS:  Life alert? No  Use of a cane, walker or w/c? No  Grab bars in the bathroom? No  Shower chair or bench in shower? No  Elevated toilet seat or a handicapped toilet? No   TIMED UP AND GO:  Was the test performed? Yes .  Length of time to ambulate 10 feet: 10 sec.   Gait steady and fast without use of assistive device  Cognitive Function:     6CIT Screen 04/16/2021 04/03/2020  What Year? 0 points 0 points  What month? 0 points 0 points  What time? 0 points -  Count back from 20 0 points 0 points  Months in reverse 0 points 0 points  Repeat phrase 0 points 0 points  Total Score 0 -    Immunizations Immunization History  Administered Date(s) Administered   Fluad Quad(high Dose 65+) 02/14/2020, 02/20/2021   Influenza, High Dose Seasonal PF 03/23/2018   Influenza,inj,Quad PF,6+ Mos 01/30/2016, 01/29/2017, 01/28/2019   Moderna SARS-COV2 Booster Vaccination 03/16/2020   Moderna Sars-Covid-2 Vaccination 06/05/2019, 07/06/2019   Pneumococcal Conjugate-13 04/06/2020   Pneumococcal Polysaccharide-23 01/28/2019    TDAP status: Due, Education has been provided regarding the importance of this vaccine. Advised may receive  this vaccine at local pharmacy or Health Dept. Aware  to provide a copy of the vaccination record if obtained from local pharmacy or Health Dept. Verbalized acceptance and understanding.  Flu Vaccine status: Up to date  Pneumococcal vaccine status: Up to date  Covid-19 vaccine status: Completed vaccines  Qualifies for Shingles Vaccine? Yes   Zostavax completed No   Shingrix Completed?: No.    Education has been provided regarding the importance of this vaccine. Patient has been advised to call insurance company to determine out of pocket expense if they have not yet received this vaccine. Advised may also receive vaccine at local pharmacy or Health Dept. Verbalized acceptance and understanding.  Screening Tests Health Maintenance  Topic Date Due   OPHTHALMOLOGY EXAM  Never done   URINE MICROALBUMIN  Never done   TETANUS/TDAP  Never done   Zoster Vaccines- Shingrix (1 of 2) Never done   COVID-19 Vaccine (3 - Moderna risk series) 04/13/2020   HEMOGLOBIN A1C  07/12/2021   FOOT EXAM  10/05/2021   Pneumonia Vaccine 40+ Years old  Completed   INFLUENZA VACCINE  Completed   HPV VACCINES  Aged Out    Health Maintenance  Health Maintenance Due  Topic Date Due   OPHTHALMOLOGY EXAM  Never done   URINE MICROALBUMIN  Never done   TETANUS/TDAP  Never done   Zoster Vaccines- Shingrix (1 of 2) Never done   COVID-19 Vaccine (3 - Moderna risk series) 04/13/2020    Colorectal cancer screening: No longer required.   Additional Screening:  Vision Screening: Recommended annual ophthalmology exams for early detection of glaucoma and other disorders of the eye. Is the patient up to date with their annual eye exam?  Yes  Who is the provider or what is the name of the office in which the patient attends annual eye exams? Dr Bing Plume  If pt is not established with a provider, would they like to be referred to a provider to establish care? No .   Dental Screening: Recommended annual dental exams for  proper oral hygiene  Community Resource Referral / Chronic Care Management: CRR required this visit?  No   CCM required this visit?  No      Plan:     I have personally reviewed and noted the following in the patients chart:   Medical and social history Use of alcohol, tobacco or illicit drugs  Current medications and supplements including opioid prescriptions. Patient is not currently taking opioid prescriptions. Functional ability and status Nutritional status Physical activity Advanced directives List of other physicians Hospitalizations, surgeries, and ER visits in previous 12 months Vitals Screenings to include cognitive, depression, and falls Referrals and appointments  In addition, I have reviewed and discussed with patient certain preventive protocols, quality metrics, and best practice recommendations. A written personalized care plan for preventive services as well as general preventive health recommendations were provided to patient.     Willette Brace, LPN   15/[id number]   Nurse Notes: None

## 2021-04-24 ENCOUNTER — Ambulatory Visit: Payer: Medicare Other | Admitting: Family Medicine

## 2021-05-03 ENCOUNTER — Other Ambulatory Visit: Payer: Self-pay

## 2021-05-03 ENCOUNTER — Ambulatory Visit (INDEPENDENT_AMBULATORY_CARE_PROVIDER_SITE_OTHER): Payer: Medicare Other | Admitting: Family Medicine

## 2021-05-03 ENCOUNTER — Other Ambulatory Visit: Payer: Self-pay | Admitting: Family Medicine

## 2021-05-03 ENCOUNTER — Encounter: Payer: Self-pay | Admitting: Family Medicine

## 2021-05-03 VITALS — BP 146/70 | HR 65 | Temp 97.6°F | Ht 72.0 in | Wt 194.6 lb

## 2021-05-03 DIAGNOSIS — E1159 Type 2 diabetes mellitus with other circulatory complications: Secondary | ICD-10-CM

## 2021-05-03 DIAGNOSIS — E1169 Type 2 diabetes mellitus with other specified complication: Secondary | ICD-10-CM | POA: Diagnosis not present

## 2021-05-03 DIAGNOSIS — H409 Unspecified glaucoma: Secondary | ICD-10-CM

## 2021-05-03 DIAGNOSIS — E1121 Type 2 diabetes mellitus with diabetic nephropathy: Secondary | ICD-10-CM

## 2021-05-03 DIAGNOSIS — N183 Chronic kidney disease, stage 3 unspecified: Secondary | ICD-10-CM | POA: Diagnosis not present

## 2021-05-03 DIAGNOSIS — I739 Peripheral vascular disease, unspecified: Secondary | ICD-10-CM | POA: Diagnosis not present

## 2021-05-03 DIAGNOSIS — I152 Hypertension secondary to endocrine disorders: Secondary | ICD-10-CM

## 2021-05-03 DIAGNOSIS — C61 Malignant neoplasm of prostate: Secondary | ICD-10-CM

## 2021-05-03 DIAGNOSIS — E785 Hyperlipidemia, unspecified: Secondary | ICD-10-CM

## 2021-05-03 LAB — CBC
HCT: 34.9 % — ABNORMAL LOW (ref 39.0–52.0)
Hemoglobin: 12 g/dL — ABNORMAL LOW (ref 13.0–17.0)
MCHC: 34.4 g/dL (ref 30.0–36.0)
MCV: 77.5 fl — ABNORMAL LOW (ref 78.0–100.0)
Platelets: 160 10*3/uL (ref 150.0–400.0)
RBC: 4.5 Mil/uL (ref 4.22–5.81)
RDW: 14 % (ref 11.5–15.5)
WBC: 4.9 10*3/uL (ref 4.0–10.5)

## 2021-05-03 LAB — COMPREHENSIVE METABOLIC PANEL
ALT: 20 U/L (ref 0–53)
AST: 21 U/L (ref 0–37)
Albumin: 4.1 g/dL (ref 3.5–5.2)
Alkaline Phosphatase: 49 U/L (ref 39–117)
BUN: 29 mg/dL — ABNORMAL HIGH (ref 6–23)
CO2: 29 mEq/L (ref 19–32)
Calcium: 9.5 mg/dL (ref 8.4–10.5)
Chloride: 105 mEq/L (ref 96–112)
Creatinine, Ser: 2.29 mg/dL — ABNORMAL HIGH (ref 0.40–1.50)
GFR: 25.52 mL/min — ABNORMAL LOW (ref >60.00)
Glucose, Bld: 202 mg/dL — ABNORMAL HIGH (ref 70–99)
Potassium: 4.3 mEq/L (ref 3.5–5.1)
Sodium: 139 mEq/L (ref 135–145)
Total Bilirubin: 0.6 mg/dL (ref 0.2–1.2)
Total Protein: 7.5 g/dL (ref 6.0–8.3)

## 2021-05-03 LAB — LIPID PANEL
Cholesterol: 130 mg/dL (ref 0–200)
HDL: 46.3 mg/dL (ref >39.00)
LDL Cholesterol: 64 mg/dL (ref 0–99)
NonHDL: 83.57
Total CHOL/HDL Ratio: 3
Triglycerides: 96 mg/dL (ref 0.0–149.0)
VLDL: 19.2 mg/dL (ref 0.0–40.0)

## 2021-05-03 LAB — TSH: TSH: 1.09 u[IU]/mL (ref 0.35–5.50)

## 2021-05-03 LAB — POCT GLYCOSYLATED HEMOGLOBIN (HGB A1C): Hemoglobin A1C: 8 % — AB (ref 4.0–5.6)

## 2021-05-03 MED ORDER — AMLODIPINE BESYLATE 5 MG PO TABS: 5.0000 mg | ORAL_TABLET | Freq: Every day | ORAL | 3 refills | Status: DC

## 2021-05-03 NOTE — Assessment & Plan Note (Signed)
Follows with urology

## 2021-05-03 NOTE — Assessment & Plan Note (Signed)
Check lipids.  On Crestor 5 mg daily.  Tolerating well.

## 2021-05-03 NOTE — Assessment & Plan Note (Signed)
At goal per JNC 8.  He is on amlodipine 10 mg daily and metoprolol succinate 25 mg daily.  He is having a bit of lower extremity edema.  This could be due to his amlodipine.  We will decrease dose to 5 mg daily and he will follow-up with me in a few weeks.  If continues to have persistent foot edema or if blood pressure elevated would consider trial of HCTZ.

## 2021-05-03 NOTE — Patient Instructions (Signed)
It was very nice to see you today!  We will check blood work today.  Please cut your amlodipine to 5 mg daily.  I will send in a prescription   We will see back in a few weeks to recheck your blood pressure and to follow-up with Stop.  Please schedule an appointment for 1 year for your next physical.  Take care, Dr Jerline Pain  PLEASE NOTE:  If you had any lab tests please let us know if you have not heard back within a few days. You may see your results on mychart before we have a chance to review them but we will give you a call once they are reviewed by [place]. If we ordered any referrals today, please let us know if you have not heard from their office within the next week.   Please try these tips to maintain a healthy lifestyle:  Eat at least 3 REAL meals and 1-2 snacks per day.  Aim for no more than 5 hours between eating.  If you eat breakfast, please do so within one hour of getting up.   Each meal should contain half fruits/vegetables, one quarter protein, and one quarter carbs (no bigger than a computer mouse)  Cut down on sweet beverages. This includes juice, soda, and sweet tea.   Drink at least 1 glass of water with each meal and aim for at least 8 glasses per day  Exercise at least 150 minutes every week.    Preventive Care 25 Years and Older, Male Preventive care refers to lifestyle choices and visits with your health care provider that can promote health and wellness. Preventive care visits are also called wellness exams. What can I expect for my preventive care visit? Counseling During your preventive care visit, your health care provider may ask about your: Medical history, including: Past medical problems. Family medical history. History of falls. Current health, including: Emotional well-being. Home life and relationship well-being. Sexual activity. Memory and ability to understand (cognition). Lifestyle, including: Alcohol, nicotine or tobacco, and drug  use. Access to firearms. Diet, exercise, and sleep habits. Work and work Statistician. Sunscreen use. Safety issues such as seatbelt and bike helmet use. Physical exam Your health care provider will check your: Height and weight. These may be used to calculate your BMI (body mass index). BMI is a measurement that tells if you are at a healthy weight. Waist circumference. This measures the distance around your waistline. This measurement also tells if you are at a healthy weight and may help predict your risk of certain diseases, such as type 2 diabetes and high blood pressure. Heart rate and blood pressure. Body temperature. Skin for abnormal spots. What immunizations do I need? Vaccines are usually given at various ages, according to a schedule. Your health care provider will recommend vaccines for you based on your age, medical history, and lifestyle or other factors, such as travel or where you work. What tests do I need? Screening Your health care provider may recommend screening tests for certain conditions. This may include: Lipid and cholesterol levels. Diabetes screening. This is done by checking your blood sugar (glucose) after you have not eaten for a while (fasting). Hepatitis C test. Hepatitis B test. HIV (human immunodeficiency virus) test. STI (sexually transmitted infection) testing, if you are at risk. Lung cancer screening. Colorectal cancer screening. Prostate cancer screening. Abdominal aortic aneurysm (AAA) screening. You may need this if you are a current or former smoker. Talk with your health care provider  about your test results, treatment options, and if necessary, the need for more tests. Follow these instructions at home: Eating and drinking  Eat a diet that includes fresh fruits and vegetables, whole grains, lean protein, and low-fat dairy products. Limit your intake of foods with high amounts of sugar, saturated fats, and salt. Take vitamin and mineral  supplements as recommended by your health care provider. Do not drink alcohol if your health care provider tells you not to drink. If you drink alcohol: Limit how much you have to 0-2 drinks a day. Know how much alcohol is in your drink. In the [place] one drink equals one 12 oz bottle of beer (355 mL), one 5 oz glass of wine (148 mL), or one 1 oz glass of hard liquor (44 mL). Lifestyle Brush your teeth every morning and night with fluoride toothpaste. Floss one time each day. Exercise for at least 30 minutes 5 or more days each week. Do not use any products that contain nicotine or tobacco. These products include cigarettes, chewing tobacco, and vaping devices, such as e-cigarettes. If you need help quitting, ask your health care provider. Do not use drugs. If you are sexually active, practice safe sex. Use a condom or other form of protection to prevent STIs. Take aspirin only as told by your health care provider. Make sure that you understand how much to take and what form to take. Work with your health care provider to find out whether it is safe and beneficial for you to take aspirin daily. Ask your health care provider if you need to take a cholesterol-lowering medicine (statin). Find healthy ways to manage stress, such as: Meditation, yoga, or listening to music. Journaling. Talking to a trusted person. Spending time with friends and family. Safety Always wear your seat belt while driving or riding in a vehicle. Do not drive: If you have been drinking alcohol. Do not ride with someone who has been drinking. When you are tired or distracted. While texting. If you have been using any mind-altering substances or drugs. Wear a helmet and other protective equipment during sports activities. If you have firearms in your house, make sure you follow all gun safety procedures. Minimize exposure to UV radiation to reduce your risk of skin cancer. What's next? Visit your health care provider  once a year for an annual wellness visit. Ask your health care provider how often you should have your eyes and teeth checked. Stay up to date on all vaccines. This information is not intended to replace advice given to you by your health care provider. Make sure you discuss any questions you have with your health care provider. Document Revised: 10/11/2020 Document Reviewed: 10/11/2020 Elsevier Patient Education  [place].

## 2021-05-03 NOTE — Assessment & Plan Note (Signed)
Check c-Met. 

## 2021-05-03 NOTE — Assessment & Plan Note (Signed)
Follow with vascular surgery. He is on statin.

## 2021-05-03 NOTE — Progress Notes (Signed)
Chief Complaint:  Richard Swanson is a 87 y.o. male who presents today for his annual comprehensive physical exam.    Assessment/Plan:  New/Acute Problems: Leg Edema Likely multifactorial in setting of venous insufficiency, peripheral artery disease, and CKD.  He is also on amlodipine which could be contributing.  We discussed conservative measures including leg elevation and salt avoidance.  We will be decreasing dose of amlodipine as below.  Follow-up with me in a few weeks.  Check labs today.  Chronic Problems Addressed Today: No problem-specific Assessment & Plan notes found for this encounter.   Preventative Healthcare: Will get blood work done today. UTD on other vaccines and screenings.  Patient Counseling(The following topics were reviewed and/or handout was given):  -Nutrition: Stressed importance of moderation in sodium/caffeine intake, saturated fat and cholesterol, caloric balance, sufficient intake of fresh fruits, vegetables, and fiber.  -Stressed the importance of regular exercise.   -Substance Abuse: Discussed cessation/primary prevention of tobacco, alcohol, or other drug use; driving or other dangerous activities under the influence; availability of treatment for abuse.   -Injury prevention: Discussed safety belts, safety helmets, smoke detector, smoking near bedding or upholstery.   -Sexuality: Discussed sexually transmitted diseases, partner selection, use of condoms, avoidance of unintended pregnancy and contraceptive alternatives.   -Dental health: Discussed importance of regular tooth brushing, flossing, and dental visits.  -Health maintenance and immunizations reviewed. Please refer to Health maintenance section.  Return to care in 1 year for next preventative visit.     Subjective:  HPI:  He has no acute complaints today.   He is here with bilateral foot swelling. This has been going on for a month or so. He notes swelling is mostly on the right foot. He  has been exercising 3 times a week. He has stopped taking salt about a year ago. He is taking Amlodipine 10 mg daily for hypertension. This could be causing the swelling. He is interesting to decrease the dose of the medication. No pain in the area. Denies fever or chills.  He has been doing well since our last visit. He is currently on Januvia 61m daily and Glipizide 5 mg daily for diabetes mellitus. Sugars are in the 200's about a month ago. Usually in the 160's or 170's at home. He is trying to cut down on sugar and trying to work on exercise. He has not been drinking coffee. No fever. No reported numbness or tingling  Lifestyle Diet: Balanced. Trying to cut down on sugar.  Exercise: Exercise 3 times a week on treadmill.  Depression screen PHQ 2/9 04/16/2021  Decreased Interest 0  Down, Depressed, Hopeless 0  PHQ - 2 Score 0    Health Maintenance Due  Topic Date Due   URINE MICROALBUMIN  Never done   TETANUS/TDAP  Never done   Zoster Vaccines- Shingrix (1 of 2) Never done     ROS: Per HPI, otherwise a complete review of systems was negative.   PMH:  The following were reviewed and entered/updated in epic: Past Medical History:  Diagnosis Date   Cancer ([facility]    Prostate   Chronic obstructive pulmonary disease, unspecified ([facility]    Diabetes mellitus without complication (HCC)    Dizziness    Gastro-esophageal reflux disease without esophagitis    Glaucoma    Glomerular disorders in diseases classified elsewhere    Gross hematuria    Hypertension    Hypertensive chronic kidney disease with stage 1 through stage 4 chronic kidney disease, or unspecified  chronic kidney disease    Kidney disease, chronic, stage III (moderate, EGFR 30-59 ml/min) (HCC)    Malignant neoplasm of prostate ([place])    Prostate cancer ([place]) 11/03/2010   dx 2006   Type 2 diabetes mellitus with diabetic nephropathy (HCC)    Unspecified kidney failure    Patient Active Problem List   Diagnosis Date Noted    Constipation 11/16/2019   Tinnitus 11/16/2019   Dyslipidemia due to type 2 diabetes mellitus ([place]) 08/23/2019   Allergic rhinitis due to pollen 08/19/2019   Hypertension associated with diabetes ([place]) 05/10/2019   GERD (gastroesophageal reflux disease)    Glaucoma    Stage 3 chronic kidney disease (HCC)    Glomerular disorders in diseases classified elsewhere    Type 2 diabetes mellitus with diabetic nephropathy, without long-term current use of insulin (HCC)    Anemia in chronic kidney disease 07/31/2015   Prostate cancer ([place]) 11/03/2010   Past Surgical History:  Procedure Laterality Date   PROSTATE SURGERY      Family History  Problem Relation Age of Onset   Stroke Father     Medications- reviewed and updated Current Outpatient Medications  Medication Sig Dispense Refill   amLODipine (NORVASC) 10 MG tablet TAKE ONE TABLET BY MOUTH DAILY 90 tablet 0   glipiZIDE (GLUCOTROL XL) 5 MG 24 hr tablet Take 2 tablets (10 mg total) by mouth daily with breakfast. 180 tablet 3   JANUVIA 50 MG tablet TAKE ONE TABLET BY MOUTH ONCE DAILY. 90 tablet 0   metoprolol succinate (TOPROL-XL) 25 MG 24 hr tablet TAKE 1 TABLET BY MOUTH EVERY DAY. 90 tablet 0   MYRBETRIQ 25 MG TB24 tablet TAKE ONE TABLET BY MOUTH ONCE DAILY. 30 tablet 0   rosuvastatin (CRESTOR) 5 MG tablet TAKE ONE TABLET BY MOUTH ONCE DAILY. 90 tablet 0   VYZULTA 0.024 % SOLN Apply 1 drop to eye at bedtime.     No current facility-administered medications for this visit.    Allergies-reviewed and updated No Known Allergies  Social History   Socioeconomic History   Marital status: Married    Spouse name: Not on file   Number of children: Not on file   Years of education: Not on file   Highest education level: Not on file  Occupational History   Occupation: Retired  Tobacco Use   Smoking status: Never   Smokeless tobacco: Never  Vaping Use   Vaping Use: Never used  Substance and Sexual Activity   Alcohol use: No    Drug use: No   Sexual activity: Not on file  Other Topics Concern   Not on file  Social History Narrative   Not on file   Social Determinants of Health   Financial Resource Strain: Low Risk    Difficulty of Paying Living Expenses: Not hard at all  Food Insecurity: No Food Insecurity   Worried About Charity fundraiser in the Last Year: Never true   [place] in the Last Year: Never true  Transportation Needs: No Transportation Needs   Lack of Transportation (Medical): No   Lack of Transportation (Non-Medical): No  Physical Activity: Sufficiently Active   Days of Exercise per Week: 3 days   Minutes of Exercise per Session: 60 min  Stress: No Stress Concern Present   Feeling of Stress : Not at all  Social Connections: Socially Integrated   Frequency of Communication with Friends and Family: Once a week   Frequency of Social Gatherings  with Friends and Family: Three times a week   Attends Religious Services: More than 4 times per year   Active Member of Clubs or Organizations: Yes   Attends Archivist Meetings: 1 to 4 times per year   Marital Status: Married        Objective:  Physical Exam: BP (!) 146/70    Pulse 65    Temp 97.6 F (36.4 C) (Temporal)    Ht 6' (1.829 m)    Wt 194 lb 9.6 oz (88.3 kg)    SpO2 100%    BMI 26.39 kg/m   Body mass index is 26.39 kg/m. Wt Readings from Last 3 Encounters:  05/03/21 194 lb 9.6 oz (88.3 kg)  04/16/21 191 lb 6.4 oz (86.8 kg)  02/20/21 192 lb 14.4 oz (87.5 kg)   Gen: NAD, resting comfortably HEENT: TMs normal bilaterally. OP clear. No thyromegaly noted.  CV: RRR with no murmurs appreciated Pulm: NWOB, CTAB with no crackles, wheezes, or rhonchi GI: Normal bowel sounds present. Soft, Nontender, Nondistended. MSK: 1+ pitting edema to midshin bilaterally. Skin: warm, dry Neuro: CN2-12 grossly intact. Strength 5/5 in upper and lower extremities. Reflexes symmetric and intact bilaterally.  Psych: Normal affect and  thought content      I,Savera Zaman,acting as a scribe for Dimas Chyle, MD.,have documented all relevant documentation on the behalf of Dimas Chyle, MD,as directed by  Dimas Chyle, MD while in the presence of Dimas Chyle, MD.   I, Dimas Chyle, MD, have reviewed all documentation for this visit. The documentation on 05/03/21 for the exam, diagnosis, procedures, and orders are all accurate and complete.  Algis Greenhouse. Jerline Pain, MD 05/03/2021 1:25 PM

## 2021-05-03 NOTE — Assessment & Plan Note (Signed)
Continue management per ophthalmology. 

## 2021-05-03 NOTE — Assessment & Plan Note (Signed)
Follows with endocrinology.  A1c 8.0 today.

## 2021-05-04 NOTE — Progress Notes (Signed)
Please inform patient of the following:  Good news! Labs are all stable. We can recheck in a year.  Richard Swanson. Jerline Pain, MD 05/04/2021 8:23 AM

## 2021-05-14 NOTE — Patient Instructions (Signed)

## 2021-05-15 ENCOUNTER — Ambulatory Visit (INDEPENDENT_AMBULATORY_CARE_PROVIDER_SITE_OTHER): Payer: Medicare Other | Admitting: Nurse Practitioner

## 2021-05-15 ENCOUNTER — Other Ambulatory Visit: Payer: Self-pay

## 2021-05-15 ENCOUNTER — Encounter: Payer: Self-pay | Admitting: Nurse Practitioner

## 2021-05-15 VITALS — BP 166/67 | HR 58 | Ht 72.0 in | Wt 189.0 lb

## 2021-05-15 DIAGNOSIS — E782 Mixed hyperlipidemia: Secondary | ICD-10-CM

## 2021-05-15 DIAGNOSIS — I10 Essential (primary) hypertension: Secondary | ICD-10-CM | POA: Diagnosis not present

## 2021-05-15 DIAGNOSIS — E1121 Type 2 diabetes mellitus with diabetic nephropathy: Secondary | ICD-10-CM

## 2021-05-15 DIAGNOSIS — I152 Hypertension secondary to endocrine disorders: Secondary | ICD-10-CM

## 2021-05-15 DIAGNOSIS — E1159 Type 2 diabetes mellitus with other circulatory complications: Secondary | ICD-10-CM

## 2021-05-15 NOTE — Progress Notes (Signed)
05/15/2021, 10:42 AM   Endocrinology follow-up note  Subjective:    Patient ID: Normal Richard Swanson, male    DOB: April 27, 1935.  Richard Swanson is being seen in follow-up after he was seen in consultation for management of currently uncontrolled symptomatic diabetes requested by  Vivi Barrack, MD.   Past Medical History:  Diagnosis Date   Cancer [facility])    Prostate   Chronic obstructive pulmonary disease, unspecified ([place])    Diabetes mellitus without complication ([place])    Dizziness    Gastro-esophageal reflux disease without esophagitis    Glaucoma    Glomerular disorders in diseases classified elsewhere    Gross hematuria    Hypertension    Hypertensive chronic kidney disease with stage 1 through stage 4 chronic kidney disease, or unspecified chronic kidney disease    Kidney disease, chronic, stage III (moderate, EGFR 30-59 ml/min) ([place])    Malignant neoplasm of prostate ([place])    Prostate cancer ([place]) 11/03/2010   dx 2006   Type 2 diabetes mellitus with diabetic nephropathy ([place])    Unspecified kidney failure     Past Surgical History:  Procedure Laterality Date   PROSTATE SURGERY      Social History   Socioeconomic History   Marital status: Married    Spouse name: Not on file   Number of children: Not on file   Years of education: Not on file   Highest education level: Not on file  Occupational History   Occupation: Retired  Tobacco Use   Smoking status: Never   Smokeless tobacco: Never  Vaping Use   Vaping Use: Never used  Substance and Sexual Activity   Alcohol use: No   Drug use: No   Sexual activity: Not on file  Other Topics Concern   Not on file  Social History Narrative   Not on file   Social Determinants of Health   Financial Resource Strain: Low Risk    Difficulty of Paying Living Expenses: Not hard at all  Food Insecurity: No Food Insecurity   Worried About Paediatric nurse in the Last Year: Never true   Ran Out of Food in the Last Year: Never true  Transportation Needs: No Transportation Needs   Lack of Transportation (Medical): No   Lack of Transportation (Non-Medical): No  Physical Activity: Sufficiently Active   Days of Exercise per Week: 3 days   Minutes of Exercise per Session: 60 min  Stress: No Stress Concern Present   Feeling of Stress : Not at all  Social Connections: Socially Integrated   Frequency of Communication with Friends and Family: Once a week   Frequency of Social Gatherings with Friends and Family: Three times a week   Attends Religious Services: More than 4 times per year   Active Member of Clubs or Organizations: Yes   Attends Archivist Meetings: 1 to 4 times per year   Marital Status: Married    Family History  Problem Relation Age of Onset   Stroke Father     Outpatient Encounter Medications as of 05/15/2021  Medication Sig   amLODipine (NORVASC) 5 MG tablet Take 1 tablet (5 mg total)  by mouth daily. (Patient taking differently: Take 2.5 mg by mouth daily.)   glipiZIDE (GLUCOTROL XL) 5 MG 24 hr tablet Take 2 tablets (10 mg total) by mouth daily with breakfast.   JANUVIA 50 MG tablet TAKE ONE TABLET BY MOUTH ONCE DAILY.   latanoprost (XALATAN) 0.005 % ophthalmic solution 1 drop at bedtime.   metoprolol succinate (TOPROL-XL) 25 MG 24 hr tablet TAKE 1 TABLET BY MOUTH EVERY DAY.   MYRBETRIQ 25 MG TB24 tablet TAKE ONE TABLET BY MOUTH ONCE DAILY.   rosuvastatin (CRESTOR) 5 MG tablet TAKE ONE TABLET BY MOUTH ONCE DAILY.   VYZULTA 0.024 % SOLN Apply 1 drop to eye at bedtime.   No facility-administered encounter medications on file as of 05/15/2021.    ALLERGIES: No Known Allergies  VACCINATION STATUS: Immunization History  Administered Date(s) Administered   Fluad Quad(high Dose 65+) 02/14/2020, 02/20/2021   Influenza, High Dose Seasonal PF 03/23/2018   Influenza,inj,Quad PF,6+ Mos 01/30/2016,  01/29/2017, 01/28/2019   Moderna SARS-COV2 Booster Vaccination 03/16/2020   Moderna Sars-Covid-2 Vaccination 06/05/2019, 07/06/2019   Pneumococcal Conjugate-13 04/06/2020   Pneumococcal Polysaccharide-23 01/28/2019    Diabetes He presents for his follow-up diabetic visit. He has type 2 diabetes mellitus. Onset time: He was diagnosed at approximate age of 87 years. His disease course has been improving. There are no hypoglycemic associated symptoms. Pertinent negatives for hypoglycemia include no confusion, headaches, pallor or seizures. There are no diabetic associated symptoms. Pertinent negatives for diabetes include no chest pain, no fatigue, no polydipsia, no polyphagia, no polyuria and no weakness. There are no hypoglycemic complications. Symptoms are stable. Diabetic complications include nephropathy, peripheral neuropathy and PVD. Risk factors for coronary artery disease include dyslipidemia, diabetes mellitus, hypertension, male sex and sedentary lifestyle. Current diabetic treatment includes oral agent (dual therapy). He is compliant with treatment most of the time. His weight is decreasing steadily. He is following a generally healthy diet. When asked about meal planning, he reported none. He has not had a previous visit with a dietitian. He rarely participates in exercise. His home blood glucose trend is fluctuating minimally. His breakfast blood glucose range is generally 140-180 mg/dl. His overall blood glucose range is 140-180 mg/dl. (He presents today with his logs, no meter, showing stable at goal fasting glycemic profile.  His previsit A1c, checked at his PCP office on 1/5 was 8%, improving from last visit of 8.4%.  He is motivated to stay off insulin.  He has increased his exercise and watched his diet.  He denies any hypoglycemia.) An ACE inhibitor/angiotensin II receptor blocker is being taken. He does not see a podiatrist.Eye exam is current.  Hyperlipidemia This is a chronic problem.  The current episode started more than 1 year ago. The problem is controlled. Recent lipid tests were reviewed and are normal. Exacerbating diseases include chronic renal disease and diabetes. Factors aggravating his hyperlipidemia include beta blockers. Pertinent negatives include no chest pain, myalgias or shortness of breath. Current antihyperlipidemic treatment includes statins. The current treatment provides mild improvement of lipids. There are no compliance problems.  Risk factors for coronary artery disease include dyslipidemia, diabetes mellitus, hypertension, male sex and a sedentary lifestyle.  Hypertension This is a chronic problem. The current episode started more than 1 year ago. The problem has been gradually improving since onset. The problem is uncontrolled (had not taken his medications yet). Pertinent negatives include no chest pain, headaches, neck pain, palpitations or shortness of breath. There are no associated agents to hypertension. Risk factors  for coronary artery disease include diabetes mellitus, dyslipidemia, male gender and sedentary lifestyle. Past treatments include ACE inhibitors, beta blockers and calcium channel blockers. The current treatment provides significant improvement. There are no compliance problems.  Hypertensive end-organ damage includes kidney disease and PVD. Identifiable causes of hypertension include chronic renal disease.    Review of systems  Constitutional: + steadily decreasing body weight,  current Body mass index is 25.63 kg/m. , no fatigue, no subjective hyperthermia, no subjective hypothermia Eyes: no blurry vision, no xerophthalmia ENT: no sore throat, no nodules palpated in throat, no dysphagia/odynophagia, no hoarseness Cardiovascular: no chest pain, no shortness of breath, no palpitations, right leg swelling Respiratory: no cough, no shortness of breath Gastrointestinal: no nausea/vomiting/diarrhea Musculoskeletal: no muscle/joint  aches Skin: no rashes, no hyperemia Neurological: no tremors, no numbness, no tingling, no dizziness Psychiatric: no depression, no anxiety   Objective:    BP (!) 166/67    Pulse (!) 58    Ht 6' (1.829 m)    Wt 189 lb (85.7 kg)    SpO2 99%    BMI 25.63 kg/m   Wt Readings from Last 3 Encounters:  05/15/21 189 lb (85.7 kg)  05/03/21 194 lb 9.6 oz (88.3 kg)  04/16/21 191 lb 6.4 oz (86.8 kg)    BP Readings from Last 3 Encounters:  05/15/21 (!) 166/67  05/03/21 (!) 146/70  04/16/21 138/74     Physical Exam- Limited  Constitutional:  Body mass index is 25.63 kg/m. , not in acute distress, normal state of mind Eyes:  EOMI, no exophthalmos Neck: Supple Cardiovascular: RRR, no murmurs, rubs, or gallops, no edema Respiratory: Adequate breathing efforts, no crackles, rales, rhonchi, or wheezing Musculoskeletal: no gross deformities, strength intact in all four extremities, no gross restriction of joint movements Skin:  no rashes, no hyperemia Neurological: no tremor with outstretched hands    CMP     Component Value Date/Time   NA 139 05/03/2021 1353   NA 140 05/14/2019 0000   K 4.3 05/03/2021 1353   CL 105 05/03/2021 1353   CO2 29 05/03/2021 1353   GLUCOSE 202 (H) 05/03/2021 1353   BUN 29 (H) 05/03/2021 1353   BUN 26 (A) 05/14/2019 0000   CREATININE 2.29 (H) 05/03/2021 1353   CREATININE 2.23 (H) 04/06/2020 1206   CALCIUM 9.5 05/03/2021 1353   PROT 7.5 05/03/2021 1353   ALBUMIN 4.1 05/03/2021 1353   AST 21 05/03/2021 1353   ALT 20 05/03/2021 1353   ALKPHOS 49 05/03/2021 1353   BILITOT 0.6 05/03/2021 1353   GFRNONAA 29 (L) 02/13/2021 1338   GFRAA 29 (L) 07/29/2019 1115    Diabetic Labs (most recent): Lab Results  Component Value Date   HGBA1C 8.0 (A) 05/03/2021   HGBA1C 8.4 (A) 01/12/2021   HGBA1C 8.3 (A) 10/05/2020     Lipid Panel ( most recent) Lipid Panel     Component Value Date/Time   CHOL 130 05/03/2021 1353   TRIG 96.0 05/03/2021 1353   HDL 46.30  05/03/2021 1353   CHOLHDL 3 05/03/2021 1353   VLDL 19.2 05/03/2021 1353   LDLCALC 64 05/03/2021 1353   LDLCALC 59 04/06/2020 1206      Lab Results  Component Value Date   TSH 1.09 05/03/2021   TSH 0.49 04/06/2020   TSH 1.48 05/05/2017      Assessment & Plan:   1) Type 2 diabetes mellitus with diabetic nephropathy, without long-term current use of insulin (HCC)  - Richard Swanson has currently uncontrolled  symptomatic type 2 DM since 87 years of age.  He presents today with his logs, no meter, showing stable at goal fasting glycemic profile.  His previsit A1c, checked at his PCP office on 1/5 was 8%, improving from last visit of 8.4%.  He is motivated to stay off insulin.  He has increased his exercise and watched his diet.  He denies any hypoglycemia.  - Recent labs reviewed.  - I had a long discussion with him about the progressive nature of diabetes and the pathology behind its complications. -his diabetes is complicated by CKD and he remains at a high risk for more acute and chronic complications which include CAD, CVA, CKD, retinopathy, and neuropathy. These are all discussed in detail with him.  - Nutritional counseling repeated at each appointment due to patients tendency to fall back in to old habits.  - The patient admits there is a room for improvement in their diet and drink choices. -  Suggestion is made for the patient to avoid simple carbohydrates from their diet including Cakes, Sweet Desserts / Pastries, Ice Cream, Soda (diet and regular), Sweet Tea, Candies, Chips, Cookies, Sweet Pastries, Store Bought Juices, Alcohol in Excess of 1-2 drinks a day, Artificial Sweeteners, Coffee Creamer, and "Sugar-free" Products. This will help patient to have stable blood glucose profile and potentially avoid unintended weight gain.   - I encouraged the patient to switch to unprocessed or minimally processed complex starch and increased protein intake (animal or plant source),  fruits, and vegetables.   - Patient is advised to stick to a routine mealtimes to eat 3 meals a day and avoid unnecessary snacks (to snack only to correct hypoglycemia).  - he sees Jearld Fenton, RDE routinely for diabetes education and meal planning.  - I have approached him with the following individualized plan to manage  his diabetes and patient agrees:   -Given his improving glycemic profile, no changes will be made to his medications today. He is advised to continue Januvia 50 mg po daily and Glipizide 10 mg XL daily with breakfast.    -He is encouraged to continue monitoring blood glucose once daily, before breakfast and to call the clinic if he has readings less than 70 or greater than 200 for 3 tests in a row.  - he is not a candidate for metformin, SGLT2 inhibitors, or full dose Januvia due to concurrent renal insufficiency.  - Specific targets for  A1c;  LDL, HDL,  and Triglycerides were discussed with the patient.  2) Blood Pressure /Hypertension:  His blood pressure is not controlled to target but he had not yet taken his medications yet this morning. He is advised to continue Metoprolol 25 mg p.o. daily and Amlodipine 2.5 mg p.o. daily.  3) Lipids/Hyperlipidemia:   His most recent lipid panel from 05/03/21 shows controlled LDL of 64.  He is advised to continue Crestor 5 mg po daily at bedtime.  Side effects and precautions discussed with him.   4)  Weight/Diet: His Body mass index is 25.63 kg/m.  -  he is not a candidate for weight loss.  Exercise, and detailed carbohydrates information provided  -  detailed on discharge instructions.  5) Chronic Care/Health Maintenance: -he  is on ACEI/ARB and Statin medications and is encouraged to initiate and continue to follow up with Ophthalmology, Dentist, Nephrologist, and Podiatrist at least yearly or according to recommendations, and advised to stay away from smoking. I have recommended yearly flu vaccine and pneumonia vaccine at  least every 5 years; moderate intensity exercise for up to 150 minutes weekly; and  sleep for at least 7 hours a day.  - he is  advised to maintain close follow up with Vivi Barrack, MD for primary care needs, as well as his other providers for optimal and coordinated care.    He is concerned about his right leg swelling.  He has used compression stockings in the past with some relief.  He is advised to mention his leg swelling to his nephrologist to see if anything can be given to help (may not be candidate for diuretic due to CKD stage 4).      I spent 40 minutes in the care of the patient today including review of labs from [place], Lipids, Thyroid Function, Hematology (current and previous including abstractions from other facilities); face-to-face time discussing  his blood glucose readings/logs, discussing hypoglycemia and hyperglycemia episodes and symptoms, medications doses, his options of short and long term treatment based on the latest standards of care / guidelines;  discussion about incorporating lifestyle medicine;  and documenting the encounter.    Please refer to Patient Instructions for Blood Glucose Monitoring and Insulin/Medications Dosing Guide"  in media tab for additional information. Please  also refer to " Patient Self Inventory" in the Media  tab for reviewed elements of pertinent patient history.  Donne Hazel participated in the discussions, expressed understanding, and voiced agreement with the above plans.  All questions were answered to his satisfaction. he is encouraged to contact clinic should he have any questions or concerns prior to his return visit.   Follow up plan: - Return in about 4 months (around 09/12/2021) for Diabetes F/U with A1c in office, No previsit labs, Bring meter and logs.  Rayetta Pigg, [facility] [address] [place], [place] [address] Phone: [phone] Fax: [phone]   05/15/2021, 10:42  AM

## 2021-05-21 DIAGNOSIS — D631 Anemia in chronic kidney disease: Secondary | ICD-10-CM | POA: Diagnosis not present

## 2021-05-21 DIAGNOSIS — E1129 Type 2 diabetes mellitus with other diabetic kidney complication: Secondary | ICD-10-CM | POA: Diagnosis not present

## 2021-05-21 DIAGNOSIS — N189 Chronic kidney disease, unspecified: Secondary | ICD-10-CM | POA: Diagnosis not present

## 2021-05-21 DIAGNOSIS — E1122 Type 2 diabetes mellitus with diabetic chronic kidney disease: Secondary | ICD-10-CM | POA: Diagnosis not present

## 2021-05-21 DIAGNOSIS — R809 Proteinuria, unspecified: Secondary | ICD-10-CM | POA: Diagnosis not present

## 2021-05-23 DIAGNOSIS — H401131 Primary open-angle glaucoma, bilateral, mild stage: Secondary | ICD-10-CM | POA: Diagnosis not present

## 2021-05-23 DIAGNOSIS — H26493 Other secondary cataract, bilateral: Secondary | ICD-10-CM | POA: Diagnosis not present

## 2021-05-24 DIAGNOSIS — I129 Hypertensive chronic kidney disease with stage 1 through stage 4 chronic kidney disease, or unspecified chronic kidney disease: Secondary | ICD-10-CM | POA: Diagnosis not present

## 2021-05-24 DIAGNOSIS — E1129 Type 2 diabetes mellitus with other diabetic kidney complication: Secondary | ICD-10-CM | POA: Diagnosis not present

## 2021-05-24 DIAGNOSIS — N189 Chronic kidney disease, unspecified: Secondary | ICD-10-CM | POA: Diagnosis not present

## 2021-05-24 DIAGNOSIS — E1122 Type 2 diabetes mellitus with diabetic chronic kidney disease: Secondary | ICD-10-CM | POA: Diagnosis not present

## 2021-05-24 DIAGNOSIS — D631 Anemia in chronic kidney disease: Secondary | ICD-10-CM | POA: Diagnosis not present

## 2021-05-24 DIAGNOSIS — R809 Proteinuria, unspecified: Secondary | ICD-10-CM | POA: Diagnosis not present

## 2021-05-31 ENCOUNTER — Encounter: Payer: Self-pay | Admitting: Family Medicine

## 2021-05-31 ENCOUNTER — Ambulatory Visit (INDEPENDENT_AMBULATORY_CARE_PROVIDER_SITE_OTHER): Payer: Medicare Other | Admitting: Family Medicine

## 2021-05-31 ENCOUNTER — Other Ambulatory Visit: Payer: Self-pay

## 2021-05-31 VITALS — BP 142/62 | HR 60 | Temp 98.1°F | Ht 72.0 in | Wt 191.4 lb

## 2021-05-31 DIAGNOSIS — M1990 Unspecified osteoarthritis, unspecified site: Secondary | ICD-10-CM | POA: Diagnosis not present

## 2021-05-31 DIAGNOSIS — E1159 Type 2 diabetes mellitus with other circulatory complications: Secondary | ICD-10-CM

## 2021-05-31 DIAGNOSIS — N183 Chronic kidney disease, stage 3 unspecified: Secondary | ICD-10-CM | POA: Diagnosis not present

## 2021-05-31 DIAGNOSIS — I10 Essential (primary) hypertension: Secondary | ICD-10-CM | POA: Diagnosis not present

## 2021-05-31 MED ORDER — TRAMADOL HCL 50 MG PO TABS
25.0000 mg | ORAL_TABLET | Freq: Two times a day (BID) | ORAL | 0 refills | Status: DC | PRN
Start: 2021-05-31 — End: 2021-06-26

## 2021-05-31 NOTE — Patient Instructions (Signed)
It was very nice to see you today!  Your blood pressure looks good today.  Please use a half a pill of tramadol twice daily as needed for your foot pain.  We will see you back when you are due for your annual checkup early next year.  Please let us know if your foot pain continues to be an issue or if you notice any changes in your blood pressure.  Take care, Dr Jerline Pain  PLEASE NOTE:  If you had any lab tests please let us know if you have not heard back within a few days. You may see your results on mychart before we have a chance to review them but we will give you a call once they are reviewed by [place]. If we ordered any referrals today, please let us know if you have not heard from their office within the next week.   Please try these tips to maintain a healthy lifestyle:  Eat at least 3 REAL meals and 1-2 snacks per day.  Aim for no more than 5 hours between eating.  If you eat breakfast, please do so within one hour of getting up.   Each meal should contain half fruits/vegetables, one quarter protein, and one quarter carbs (no bigger than a computer mouse)  Cut down on sweet beverages. This includes juice, soda, and sweet tea.   Drink at least 1 glass of water with each meal and aim for at least 8 glasses per day  Exercise at least 150 minutes every week.

## 2021-05-31 NOTE — Progress Notes (Signed)
° °  Richard Swanson is a 87 y.o. male who presents today for an office visit.  Assessment/Plan:  Chronic Problems Addressed Today: Osteoarthritis Not a candidate for NSAIDs due to CKD.  We will start very low-dose tramadol.  Discussed potential side effects.  Database without red flags.  He will follow-up with me in a few weeks via MyChart.  Hypertension associated with diabetes ([place]) At goal today after medication changes.  He has been tolerating changes well.  We will continue amlodipine 2.5 mg daily and valsartan 40 mg daily.  Leg swelling has improved with lower dose of amlodipine.  Stage 3 chronic kidney disease Renal function stable on recent labs.  Continue management per nephrology.     Subjective:  HPI:  He is here to follow up. Last saw him in the office on 05/03/2021.  He is on Amlodipine 5 mg daily for hypertension. He was having some issue with leg swelling in the last visit. He notes he still have some pain in the feet. This comes and goes. He is not sure what could be causing the pain. He described pain as "sharp and sudden" pain. He tried Tynelol with some relief. He notes swelling has improved from the last visit but some pain is still present. Denies fever or chills.   He saw his nephrologist Dr Ulice Bold on 05/24/2021. His blood pressure was elevated at that visit. He was started on Valsartan 40 mg. His blood pressure was 122/70. He is tolerating his medication. No side effects.         Objective:  Physical Exam: BP (!) 142/62 (BP Location: Left Arm)    Pulse 60    Temp 98.1 F (36.7 C) (Temporal)    Ht 6' (1.829 m)    Wt 191 lb 6.4 oz (86.8 kg)    SpO2 100%    BMI 25.96 kg/m   Gen: No acute distress, resting comfortably CV: Regular rate and rhythm with no murmurs appreciated Pulm: Normal work of breathing, clear to auscultation bilaterally with no crackles, wheezes, or rhonchi Neuro: Grossly normal, moves all extremities Psych: Normal affect and  thought content       I,Savera Zaman,acting as a scribe for Dimas Chyle, MD.,have documented all relevant documentation on the behalf of Dimas Chyle, MD,as directed by  Dimas Chyle, MD while in the presence of Dimas Chyle, MD.   I, Dimas Chyle, MD, have reviewed all documentation for this visit. The documentation on 05/31/21 for the exam, diagnosis, procedures, and orders are all accurate and complete.  Algis Greenhouse. Jerline Pain, MD 05/31/2021 1:21 PM

## 2021-05-31 NOTE — Assessment & Plan Note (Signed)
Renal function stable on recent labs.  Continue management per nephrology.

## 2021-05-31 NOTE — Assessment & Plan Note (Addendum)
At goal today after medication changes.  He has been tolerating changes well.  We will continue amlodipine 2.5 mg daily and valsartan 40 mg daily.  Leg swelling has improved with lower dose of amlodipine.

## 2021-05-31 NOTE — Assessment & Plan Note (Signed)
Not a candidate for NSAIDs due to CKD.  We will start very low-dose tramadol.  Discussed potential side effects.  Database without red flags.  He will follow-up with me in a few weeks via MyChart.

## 2021-06-01 ENCOUNTER — Other Ambulatory Visit: Payer: Self-pay | Admitting: Family Medicine

## 2021-06-12 ENCOUNTER — Other Ambulatory Visit: Payer: Self-pay | Admitting: Family Medicine

## 2021-06-12 DIAGNOSIS — I10 Essential (primary) hypertension: Secondary | ICD-10-CM

## 2021-06-17 ENCOUNTER — Other Ambulatory Visit: Payer: Self-pay | Admitting: Nurse Practitioner

## 2021-06-25 ENCOUNTER — Other Ambulatory Visit: Payer: Self-pay | Admitting: Family Medicine

## 2021-06-26 NOTE — Telephone Encounter (Signed)
LAST APPOINTMENT DATE: 06/12/2021   NEXT APPOINTMENT DATE: Visit date not found

## 2021-07-03 ENCOUNTER — Other Ambulatory Visit: Payer: Self-pay | Admitting: Family Medicine

## 2021-07-23 ENCOUNTER — Other Ambulatory Visit: Payer: Self-pay | Admitting: "Endocrinology

## 2021-07-23 ENCOUNTER — Other Ambulatory Visit: Payer: Self-pay | Admitting: Family Medicine

## 2021-07-23 DIAGNOSIS — E782 Mixed hyperlipidemia: Secondary | ICD-10-CM

## 2021-08-06 ENCOUNTER — Other Ambulatory Visit: Payer: Self-pay | Admitting: Family Medicine

## 2021-08-06 DIAGNOSIS — R809 Proteinuria, unspecified: Secondary | ICD-10-CM | POA: Diagnosis not present

## 2021-08-06 DIAGNOSIS — E1129 Type 2 diabetes mellitus with other diabetic kidney complication: Secondary | ICD-10-CM | POA: Diagnosis not present

## 2021-08-06 DIAGNOSIS — D631 Anemia in chronic kidney disease: Secondary | ICD-10-CM | POA: Diagnosis not present

## 2021-08-06 DIAGNOSIS — E1122 Type 2 diabetes mellitus with diabetic chronic kidney disease: Secondary | ICD-10-CM | POA: Diagnosis not present

## 2021-08-06 DIAGNOSIS — N189 Chronic kidney disease, unspecified: Secondary | ICD-10-CM | POA: Diagnosis not present

## 2021-08-08 DIAGNOSIS — E1122 Type 2 diabetes mellitus with diabetic chronic kidney disease: Secondary | ICD-10-CM | POA: Diagnosis not present

## 2021-08-08 DIAGNOSIS — N189 Chronic kidney disease, unspecified: Secondary | ICD-10-CM | POA: Diagnosis not present

## 2021-08-08 DIAGNOSIS — I129 Hypertensive chronic kidney disease with stage 1 through stage 4 chronic kidney disease, or unspecified chronic kidney disease: Secondary | ICD-10-CM | POA: Diagnosis not present

## 2021-08-08 DIAGNOSIS — D631 Anemia in chronic kidney disease: Secondary | ICD-10-CM | POA: Diagnosis not present

## 2021-08-08 DIAGNOSIS — E1129 Type 2 diabetes mellitus with other diabetic kidney complication: Secondary | ICD-10-CM | POA: Diagnosis not present

## 2021-08-08 DIAGNOSIS — R809 Proteinuria, unspecified: Secondary | ICD-10-CM | POA: Diagnosis not present

## 2021-08-29 ENCOUNTER — Other Ambulatory Visit: Payer: Self-pay | Admitting: Family Medicine

## 2021-08-29 NOTE — Telephone Encounter (Signed)
Last visit: 05/31/21 ? ?Next visit: 05/06/22 ? ?Last filled: 07/23/21 ? ?Quantity: 15 tablets 0 refills  ?

## 2021-09-03 ENCOUNTER — Other Ambulatory Visit: Payer: Self-pay | Admitting: Family Medicine

## 2021-09-06 ENCOUNTER — Ambulatory Visit (INDEPENDENT_AMBULATORY_CARE_PROVIDER_SITE_OTHER): Payer: Medicare Other | Admitting: Family Medicine

## 2021-09-06 ENCOUNTER — Encounter: Payer: Self-pay | Admitting: Family

## 2021-09-06 ENCOUNTER — Encounter: Payer: Self-pay | Admitting: Family Medicine

## 2021-09-06 ENCOUNTER — Ambulatory Visit (INDEPENDENT_AMBULATORY_CARE_PROVIDER_SITE_OTHER): Payer: Medicare Other | Admitting: Family

## 2021-09-06 VITALS — BP 163/68 | HR 60 | Temp 97.4°F | Ht 72.0 in | Wt 190.1 lb

## 2021-09-06 DIAGNOSIS — E1159 Type 2 diabetes mellitus with other circulatory complications: Secondary | ICD-10-CM | POA: Diagnosis not present

## 2021-09-06 DIAGNOSIS — I10 Essential (primary) hypertension: Secondary | ICD-10-CM

## 2021-09-06 NOTE — Progress Notes (Signed)
? ?Subjective:  ? ? ? Patient ID: Richard Swanson, male    DOB: 09/09/1934, 87 y.o.   MRN: [id number] ? ?Chief Complaint  ?Patient presents with  ? Hypertension  ?  Pt c/o BP medication making him dizziness. Mostly when he wakes up in the morning for 6 months.   ? ?HPI: ?Hypertension: Patient is currently maintained on the following medications for blood pressure: amlodipine qhs, metoprolol qhs, Valsartan 34m qam ?Failed meds include:  ?Patient reports good compliance with blood pressure medications. ?Patient denies chest pain, headaches, shortness of breath or swelling. Reports having dizziness upon awakening that lasts for a few hours. ?Last 3 blood pressure readings in our office are as follows: ?BP Readings from Last 3 Encounters:  ?09/06/21 (!) 163/68  ?05/31/21 (!) 142/62  ?05/15/21 (!) 166/67  ? ?Assessment & Plan:  ? ?Problem List Items Addressed This Visit   ? ?  ? Cardiovascular and Mediastinum  ? Hy[place]ension associated with diabetes ([place]  ?  Chronic - Unstable - pt reports dizziness in am and thinks r/t too many BP meds, however, BP high today. Taking Amlodipine 547mand Metoprolol 259mR qhs & Valsartan 1m82m am. Advised pt cut his Amlodipine in half and take 1/2 pill qhs and 1/2 pill in am, continue other meds as directed, monitor BP at home, write down readings and bring back to f/u in 4 weeks. ? ?  ?  ? ? ?Outpatient Medications Prior to Visit  ?Medication Sig Dispense Refill  ? amLODipine (NORVASC) 5 MG tablet Take 1 tablet (5 mg total) by mouth daily. (Patient taking differently: Take 2.5 mg by mouth daily.) 90 tablet 3  ? glipiZIDE (GLUCOTROL XL) 5 MG 24 hr tablet Take 2 tablets (10 mg total) by mouth daily with breakfast. 180 tablet 3  ? JANUVIA 50 MG tablet TAKE ONE TABLET BY MOUTH ONCE DAILY. 90 tablet 1  ? latanoprost (XALATAN) 0.005 % ophthalmic solution 1 drop at bedtime.    ? metoprolol succinate (TOPROL-XL) 25 MG 24 hr tablet TAKE 1 TABLET BY MOUTH EVERY DAY. 90 tablet 0  ?  MYRBETRIQ 25 MG TB24 tablet TAKE ONE TABLET BY MOUTH ONCE DAILY. 30 tablet 0  ? rosuvastatin (CRESTOR) 5 MG tablet TAKE ONE TABLET BY MOUTH ONCE DAILY. 90 tablet 0  ? traMADol (ULTRAM) 50 MG tablet TAKE (1/2) TABLET BY MOUTH EVERY 12 HOURS AS NEEDED. 30 tablet 0  ? valsartan (DIOVAN) 40 MG tablet Take 40 mg by mouth daily.    ? VYZULTA 0.024 % SOLN Apply 1 drop to eye at bedtime.    ? amLODipine (NORVASC) 10 MG tablet TAKE ONE TABLET BY MOUTH DAILY 90 tablet 0  ? ?No facility-administered medications prior to visit.  ? ? ?Past Medical History:  ?Diagnosis Date  ? [facility]  ? Chronic obstructive pulmonary disease, unspecified (HCC)[place]? Diabetes mellitus without complication (HCC)[place]? Dizziness   ? Gastro-esophageal reflux disease without esophagitis   ? Glaucoma   ? Glomerular disorders in diseases classified elsewhere   ? Gross hematuria   ? Hypertensive chronic kidney disease with stage 1 through stage 4 chronic kidney disease, or unspecified chronic kidney disease   ? Kidney disease, chronic, stage III (moderate, EGFR 30-59 ml/min) (HCC)   ? Malignant neoplasm of prostate (HCC)[place]? Prostate cancer (HCC)[place]7/2012  ? dx 2006  ? Type 2 diabetes mellitus with diabetic nephropathy (HCC)[place]? Unspecified  kidney failure   ? ? ?Past Surgical History:  ?Procedure Laterality Date  ? PROSTATE SURGERY    ? ? ?No Known Allergies ? ?   ?Objective:  ?  ?Physical Exam ?Vitals and nursing note reviewed.  ?Constitutional:   ?   General: He is not in acute distress. ?   Appearance: Normal appearance.  ?HENT:  ?   Head: Normocephalic.  ?Cardiovascular:  ?   Rate and Rhythm: Normal rate and regular rhythm.  ?Pulmonary:  ?   Effort: Pulmonary effort is normal.  ?   Breath sounds: Normal breath sounds.  ?Musculoskeletal:     ?   General: Normal range of motion.  ?   Cervical back: Normal range of motion.  ?Skin: ?   General: Skin is warm and dry.  ?Neurological:  ?   Mental Status: He is alert and oriented to person,  place, and time.  ?Psychiatric:     ?   Mood and Affect: Mood normal.  ? ? ?BP (!) 163/68 (BP Location: Left Arm, Patient Position: Sitting, Cuff Size: Large)   Pulse 60   Temp (!) 97.4 ?F (36.3 ?C) (Temporal)   Ht 6' (1.829 m)   Wt 190 lb 2 oz (86.2 kg)   SpO2 99%   BMI 25.79 kg/m?  ?Wt Readings from Last 3 Encounters:  ?09/06/21 190 lb 2 oz (86.2 kg)  ?05/31/21 191 lb 6.4 oz (86.8 kg)  ?05/15/21 189 lb (85.7 kg)  ? ? ?   ? ?No orders of the defined types were placed in this encounter. ? ? ?Jeanie Sewer, NP ? ?

## 2021-09-06 NOTE — Assessment & Plan Note (Addendum)
Chronic - Unstable - pt reports dizziness in am and thinks r/t too many BP meds, however, BP high today. Taking Amlodipine '5mg'$  and Metoprolol '25mg'$  ER qhs & Valsartan '40mg'$  in am. Advised pt cut his Amlodipine in half and take 1/2 pill qhs and 1/2 pill in am, continue other meds as directed, monitor BP at home, write down readings and bring back to f/u in 4 weeks. ?

## 2021-09-06 NOTE — Patient Instructions (Addendum)
It was very nice to see you today! ? ?Cut the Amlodipine '5mg'$  pill in half = 2.'5mg'$  and take 1/2 pill at bedtime and the other half in the morning to see if this helps your dizziness.  ?Continue taking the Valsartan '40mg'$  pill every morning and the Metoprolol at bedtime.  ? ?Check your blood pressure in the morning and evening, write the readings down including your heart rate and bring this with you to your follow up appointment in 4 weeks. ? ? ? ? ?PLEASE NOTE: ? ?If you had any lab tests please let us know if you have not heard back within a few days. You may see your results on MyChart before we have a chance to review them but we will give you a call once they are reviewed by [place]. If we ordered any referrals today, please let us know if you have not heard from their office within the next week.  ? ?

## 2021-09-07 NOTE — Progress Notes (Signed)
Erroneous encounter. ? ?Richard Swanson. Jerline Pain, MD ?09/07/2021 8:00 AM  ? ?

## 2021-09-12 ENCOUNTER — Ambulatory Visit (INDEPENDENT_AMBULATORY_CARE_PROVIDER_SITE_OTHER): Payer: Medicare Other | Admitting: Nurse Practitioner

## 2021-09-12 ENCOUNTER — Encounter: Payer: Self-pay | Admitting: Nurse Practitioner

## 2021-09-12 VITALS — BP 157/67 | HR 62 | Ht 72.0 in | Wt 185.0 lb

## 2021-09-12 DIAGNOSIS — I152 Hypertension secondary to endocrine disorders: Secondary | ICD-10-CM | POA: Diagnosis not present

## 2021-09-12 DIAGNOSIS — I10 Essential (primary) hypertension: Secondary | ICD-10-CM | POA: Diagnosis not present

## 2021-09-12 DIAGNOSIS — E782 Mixed hyperlipidemia: Secondary | ICD-10-CM

## 2021-09-12 DIAGNOSIS — E1121 Type 2 diabetes mellitus with diabetic nephropathy: Secondary | ICD-10-CM | POA: Diagnosis not present

## 2021-09-12 DIAGNOSIS — E1159 Type 2 diabetes mellitus with other circulatory complications: Secondary | ICD-10-CM | POA: Diagnosis not present

## 2021-09-12 LAB — POCT GLYCOSYLATED HEMOGLOBIN (HGB A1C): HbA1c POC (<> result, manual entry): 7.8 % (ref 4.0–5.6)

## 2021-09-12 NOTE — Patient Instructions (Signed)
Diabetes Mellitus and Foot Care Foot care is an important part of your health, especially when you have diabetes. Diabetes may cause you to have problems because of poor blood flow (circulation) to your feet and legs, which can cause your skin to: Become thinner and drier. Break more easily. Heal more slowly. Peel and crack. You may also have nerve damage (neuropathy) in your legs and feet, causing decreased feeling in them. This means that you may not notice minor injuries to your feet that could lead to more serious problems. Noticing and addressing any potential problems early is the best way to prevent future foot problems. How to care for your feet Foot hygiene  Wash your feet daily with warm water and mild soap. Do not use hot water. Then, pat your feet and the areas between your toes until they are completely dry. Do not soak your feet as this can dry your skin. Trim your toenails straight across. Do not dig under them or around the cuticle. File the edges of your nails with an emery board or nail file. Apply a moisturizing lotion or petroleum jelly to the skin on your feet and to dry, brittle toenails. Use lotion that does not contain alcohol and is unscented. Do not apply lotion between your toes. Shoes and socks Wear clean socks or stockings every day. Make sure they are not too tight. Do not wear knee-high stockings since they may decrease blood flow to your legs. Wear shoes that fit properly and have enough cushioning. Always look in your shoes before you put them on to be sure there are no objects inside. To break in new shoes, wear them for just a few hours a day. This prevents injuries on your feet. Wounds, scrapes, corns, and calluses  Check your feet daily for blisters, cuts, bruises, sores, and redness. If you cannot see the bottom of your feet, use a mirror or ask someone for help. Do not cut corns or calluses or try to remove them with medicine. If you find a minor scrape,  cut, or break in the skin on your feet, keep it and the skin around it clean and dry. You may clean these areas with mild soap and water. Do not clean the area with peroxide, alcohol, or iodine. If you have a wound, scrape, corn, or callus on your foot, look at it several times a day to make sure it is healing and not infected. Check for: Redness, swelling, or pain. Fluid or blood. Warmth. Pus or a bad smell. General tips Do not cross your legs. This may decrease blood flow to your feet. Do not use heating pads or hot water bottles on your feet. They may burn your skin. If you have lost feeling in your feet or legs, you may not know this is happening until it is too late. Protect your feet from hot and cold by wearing shoes, such as at the beach or on hot pavement. Schedule a complete foot exam at least once a year (annually) or more often if you have foot problems. Report any cuts, sores, or bruises to your health care provider immediately. Where to find more information American Diabetes Association: www.diabetes.org Association of Diabetes Care & Education Specialists: www.diabeteseducator.org Contact a health care provider if: You have a medical condition that increases your risk of infection and you have any cuts, sores, or bruises on your feet. You have an injury that is not healing. You have redness on your legs or feet. You   feel burning or tingling in your legs or feet. You have pain or cramps in your legs and feet. Your legs or feet are numb. Your feet always feel cold. You have pain around any toenails. Get help right away if: You have a wound, scrape, corn, or callus on your foot and: You have pain, swelling, or redness that gets worse. You have fluid or blood coming from the wound, scrape, corn, or callus. Your wound, scrape, corn, or callus feels warm to the touch. You have pus or a bad smell coming from the wound, scrape, corn, or callus. You have a fever. You have a red  line going up your leg. Summary Check your feet every day for blisters, cuts, bruises, sores, and redness. Apply a moisturizing lotion or petroleum jelly to the skin on your feet and to dry, brittle toenails. Wear shoes that fit properly and have enough cushioning. If you have foot problems, report any cuts, sores, or bruises to your health care provider immediately. Schedule a complete foot exam at least once a year (annually) or more often if you have foot problems. This information is not intended to replace advice given to you by your health care provider. Make sure you discuss any questions you have with your health care provider. Document Revised: 11/04/2019 Document Reviewed: 11/04/2019 Elsevier Patient Education  2023 Elsevier Inc.  

## 2021-09-12 NOTE — Progress Notes (Signed)
? ?                                                  ?     09/12/2021, 2:27 PM ? ? ?Endocrinology follow-up note ? ?Subjective:  ? ? Patient ID: Richard Swanson, male    DOB: 1934-08-28.  ?Richard Swanson is being seen in follow-up after he was seen in consultation for management of currently uncontrolled symptomatic diabetes requested by  Vivi Barrack, MD. ? ? ?Past Medical History:  ?Diagnosis Date  ? Cancer [facility])   ? Prostate  ? Chronic obstructive pulmonary disease, unspecified ([place])   ? Diabetes mellitus without complication ([place])   ? Dizziness   ? Gastro-esophageal reflux disease without esophagitis   ? Glaucoma   ? Glomerular disorders in diseases classified elsewhere   ? Gross hematuria   ? Hypertensive chronic kidney disease with stage 1 through stage 4 chronic kidney disease, or unspecified chronic kidney disease   ? Kidney disease, chronic, stage III (moderate, EGFR 30-59 ml/min) (HCC)   ? Malignant neoplasm of prostate ([place])   ? Prostate cancer ([place]) 11/03/2010  ? dx 2006  ? Type 2 diabetes mellitus with diabetic nephropathy ([place])   ? Unspecified kidney failure   ? ? ?Past Surgical History:  ?Procedure Laterality Date  ? PROSTATE SURGERY    ? ? ?Social History  ? ?Socioeconomic History  ? Marital status: Married  ?  Spouse name: Not on file  ? Number of children: Not on file  ? Years of education: Not on file  ? Highest education level: Not on file  ?Occupational History  ? Occupation: Retired  ?Tobacco Use  ? Smoking status: Never  ? Smokeless tobacco: Never  ?Vaping Use  ? Vaping Use: Never used  ?Substance and Sexual Activity  ? Alcohol use: No  ? Drug use: No  ? Sexual activity: Not on file  ?Other Topics Concern  ? Not on file  ?Social History Narrative  ? Not on file  ? ?Social Determinants of Health  ? ?Financial Resource Strain: Low Risk   ? Difficulty of Paying Living Expenses: Not hard at all  ?Food Insecurity: No Food Insecurity  ? Worried About Charity fundraiser in the  Last Year: Never true  ? Ran Out of Food in the Last Year: Never true  ?Transportation Needs: No Transportation Needs  ? Lack of Transportation (Medical): No  ? Lack of Transportation (Non-Medical): No  ?Physical Activity: Sufficiently Active  ? Days of Exercise per Week: 3 days  ? Minutes of Exercise per Session: 60 min  ?Stress: No Stress Concern Present  ? Feeling of Stress : Not at all  ?Social Connections: Socially Integrated  ? Frequency of Communication with Friends and Family: Once a week  ? Frequency of Social Gatherings with Friends and Family: Three times a week  ? Attends Religious Services: More than 4 times per year  ? Active Member of Clubs or Organizations: Yes  ? Attends Archivist Meetings: 1 to 4 times per year  ? Marital Status: Married  ? ? ?Family History  ?Problem Relation Age of Onset  ? Stroke Father   ? ? ?Outpatient Encounter Medications as of 09/12/2021  ?Medication Sig  ? amLODipine (NORVASC) 5 MG tablet Take 1 tablet (5 mg total) by mouth daily. (Patient  taking differently: Take 2.5 mg by mouth daily.)  ? glipiZIDE (GLUCOTROL XL) 5 MG 24 hr tablet Take 2 tablets (10 mg total) by mouth daily with breakfast.  ? JANUVIA 50 MG tablet TAKE ONE TABLET BY MOUTH ONCE DAILY.  ? latanoprost (XALATAN) 0.005 % ophthalmic solution 1 drop at bedtime.  ? metoprolol succinate (TOPROL-XL) 25 MG 24 hr tablet TAKE 1 TABLET BY MOUTH EVERY DAY.  ? MYRBETRIQ 25 MG TB24 tablet TAKE ONE TABLET BY MOUTH ONCE DAILY.  ? rosuvastatin (CRESTOR) 5 MG tablet TAKE ONE TABLET BY MOUTH ONCE DAILY.  ? traMADol (ULTRAM) 50 MG tablet TAKE (1/2) TABLET BY MOUTH EVERY 12 HOURS AS NEEDED.  ? valsartan (DIOVAN) 40 MG tablet Take 40 mg by mouth daily.  ? VYZULTA 0.024 % SOLN Apply 1 drop to eye at bedtime.  ? ?No facility-administered encounter medications on file as of 09/12/2021.  ? ? ?ALLERGIES: ?No Known Allergies ? ?VACCINATION STATUS: ?Immunization History  ?Administered Date(s) Administered  ? Fluad Quad(high  Dose 65+) 02/14/2020, 02/20/2021  ? Influenza, High Dose Seasonal PF 03/23/2018  ? Influenza,inj,Quad PF,6+ Mos 01/30/2016, 01/29/2017, 01/28/2019  ? Moderna SARS-COV2 Booster Vaccination 03/16/2020  ? Moderna Sars-Covid-2 Vaccination 06/05/2019, 07/06/2019  ? Pneumococcal Conjugate-13 04/06/2020  ? Pneumococcal Polysaccharide-23 01/28/2019  ? ? ?Diabetes ?He presents for his follow-up diabetic visit. He has type 2 diabetes mellitus. Onset time: He was diagnosed at approximate age of 34 years. His disease course has been improving. There are no hypoglycemic associated symptoms. Pertinent negatives for hypoglycemia include no confusion, headaches, pallor or seizures. There are no diabetic associated symptoms. Pertinent negatives for diabetes include no chest pain, no fatigue, no polydipsia, no polyphagia, no polyuria and no weakness. There are no hypoglycemic complications. Symptoms are stable. Diabetic complications include nephropathy, peripheral neuropathy and PVD. Risk factors for coronary artery disease include dyslipidemia, diabetes mellitus, hypertension, male sex and sedentary lifestyle. Current diabetic treatment includes oral agent (dual therapy). He is compliant with treatment most of the time. His weight is decreasing steadily. He is following a generally healthy diet. When asked about meal planning, he reported none. He has not had a previous visit with a dietitian. He rarely participates in exercise. His home blood glucose trend is fluctuating minimally. His breakfast blood glucose range is generally 140-180 mg/dl. His overall blood glucose range is 140-180 mg/dl. (He presents today with his logs, no meter, showing stable, at target fasting glycemic profile.  His POCT A1c today is 7.8%, improving from last visit of 8%.  He denies any hypoglycemia.  He is more active lately and has worked on his diet.  ) An ACE inhibitor/angiotensin II receptor blocker is being taken. He does not see a podiatrist.Eye  exam is current.  ?Hyperlipidemia ?This is a chronic problem. The current episode started more than 1 year ago. The problem is controlled. Recent lipid tests were reviewed and are normal. Exacerbating diseases include chronic renal disease and diabetes. Factors aggravating his hyperlipidemia include beta blockers. Pertinent negatives include no chest pain, myalgias or shortness of breath. Current antihyperlipidemic treatment includes statins. The current treatment provides mild improvement of lipids. There are no compliance problems.  Risk factors for coronary artery disease include dyslipidemia, diabetes mellitus, hypertension, male sex and a sedentary lifestyle.  ?Hypertension ?This is a chronic problem. The current episode started more than 1 year ago. The problem has been gradually improving since onset. The problem is controlled. Pertinent negatives include no chest pain, headaches, neck pain, palpitations or shortness of  breath. There are no associated agents to hypertension. Risk factors for coronary artery disease include diabetes mellitus, dyslipidemia, male gender and sedentary lifestyle. Past treatments include ACE inhibitors, beta blockers and calcium channel blockers. The current treatment provides significant improvement. There are no compliance problems.  Hypertensive end-organ damage includes kidney disease and PVD. Identifiable causes of hypertension include chronic renal disease.  ? ? ?Review of systems ? ?Constitutional: + steadily decreasing body weight,  current Body mass index is 25.09 kg/m?. , no fatigue, no subjective hyperthermia, no subjective hypothermia ?Eyes: no blurry vision, no xerophthalmia ?ENT: no sore throat, no nodules palpated in throat, no dysphagia/odynophagia, no hoarseness ?Cardiovascular: no chest pain, no shortness of breath, no palpitations, right leg swelling ?Respiratory: no cough, no shortness of breath ?Gastrointestinal: no nausea/vomiting/diarrhea ?Musculoskeletal: no  muscle/joint aches ?Skin: no rashes, no hyperemia ?Neurological: no tremors, no numbness, no tingling, no dizziness ?Psychiatric: no depression, no anxiety ? ? ?Objective:  ?  ?BP (!) 157/67   Pulse 62   Ht

## 2021-09-14 ENCOUNTER — Other Ambulatory Visit: Payer: Self-pay | Admitting: Family Medicine

## 2021-09-14 ENCOUNTER — Other Ambulatory Visit: Payer: Self-pay

## 2021-09-14 DIAGNOSIS — I10 Essential (primary) hypertension: Secondary | ICD-10-CM

## 2021-09-26 DIAGNOSIS — E1165 Type 2 diabetes mellitus with hyperglycemia: Secondary | ICD-10-CM | POA: Diagnosis not present

## 2021-09-26 DIAGNOSIS — H26493 Other secondary cataract, bilateral: Secondary | ICD-10-CM | POA: Diagnosis not present

## 2021-09-26 DIAGNOSIS — H401131 Primary open-angle glaucoma, bilateral, mild stage: Secondary | ICD-10-CM | POA: Diagnosis not present

## 2021-09-26 DIAGNOSIS — H35033 Hypertensive retinopathy, bilateral: Secondary | ICD-10-CM | POA: Diagnosis not present

## 2021-10-05 ENCOUNTER — Other Ambulatory Visit: Payer: Self-pay | Admitting: Family Medicine

## 2021-10-05 DIAGNOSIS — E1122 Type 2 diabetes mellitus with diabetic chronic kidney disease: Secondary | ICD-10-CM | POA: Diagnosis not present

## 2021-10-05 DIAGNOSIS — I129 Hypertensive chronic kidney disease with stage 1 through stage 4 chronic kidney disease, or unspecified chronic kidney disease: Secondary | ICD-10-CM | POA: Diagnosis not present

## 2021-10-05 DIAGNOSIS — N189 Chronic kidney disease, unspecified: Secondary | ICD-10-CM | POA: Diagnosis not present

## 2021-10-05 DIAGNOSIS — D631 Anemia in chronic kidney disease: Secondary | ICD-10-CM | POA: Diagnosis not present

## 2021-10-10 DIAGNOSIS — I129 Hypertensive chronic kidney disease with stage 1 through stage 4 chronic kidney disease, or unspecified chronic kidney disease: Secondary | ICD-10-CM | POA: Diagnosis not present

## 2021-10-10 DIAGNOSIS — N189 Chronic kidney disease, unspecified: Secondary | ICD-10-CM | POA: Diagnosis not present

## 2021-10-10 DIAGNOSIS — E1122 Type 2 diabetes mellitus with diabetic chronic kidney disease: Secondary | ICD-10-CM | POA: Diagnosis not present

## 2021-10-10 DIAGNOSIS — D631 Anemia in chronic kidney disease: Secondary | ICD-10-CM | POA: Diagnosis not present

## 2021-10-10 DIAGNOSIS — E1129 Type 2 diabetes mellitus with other diabetic kidney complication: Secondary | ICD-10-CM | POA: Diagnosis not present

## 2021-10-10 DIAGNOSIS — R809 Proteinuria, unspecified: Secondary | ICD-10-CM | POA: Diagnosis not present

## 2021-10-11 ENCOUNTER — Ambulatory Visit: Payer: Medicare Other | Admitting: Family Medicine

## 2021-10-17 ENCOUNTER — Other Ambulatory Visit: Payer: Self-pay | Admitting: Family Medicine

## 2021-10-17 NOTE — Telephone Encounter (Signed)
Pt requesting refill for Tramadol 50 mg 1/2 tablet q 12 hrs for foot pain. Last OV was 05/31/2021 with Dr. Jerline Pain and is scheduled to see him again 11/08/2021.

## 2021-10-25 ENCOUNTER — Ambulatory Visit: Payer: Medicare Other | Admitting: Family Medicine

## 2021-10-26 ENCOUNTER — Other Ambulatory Visit: Payer: Self-pay | Admitting: Nurse Practitioner

## 2021-10-26 DIAGNOSIS — E782 Mixed hyperlipidemia: Secondary | ICD-10-CM

## 2021-11-06 ENCOUNTER — Other Ambulatory Visit: Payer: Self-pay | Admitting: Nurse Practitioner

## 2021-11-06 ENCOUNTER — Other Ambulatory Visit: Payer: Self-pay | Admitting: Family Medicine

## 2021-11-08 ENCOUNTER — Encounter: Payer: Self-pay | Admitting: Family Medicine

## 2021-11-08 ENCOUNTER — Ambulatory Visit (INDEPENDENT_AMBULATORY_CARE_PROVIDER_SITE_OTHER): Payer: Medicare Other | Admitting: Family Medicine

## 2021-11-08 VITALS — BP 132/70 | HR 63 | Temp 97.6°F | Ht 72.0 in | Wt 183.0 lb

## 2021-11-08 DIAGNOSIS — E1121 Type 2 diabetes mellitus with diabetic nephropathy: Secondary | ICD-10-CM | POA: Diagnosis not present

## 2021-11-08 DIAGNOSIS — E1159 Type 2 diabetes mellitus with other circulatory complications: Secondary | ICD-10-CM

## 2021-11-08 DIAGNOSIS — I152 Hypertension secondary to endocrine disorders: Secondary | ICD-10-CM | POA: Diagnosis not present

## 2021-11-08 DIAGNOSIS — N3281 Overactive bladder: Secondary | ICD-10-CM | POA: Diagnosis not present

## 2021-11-08 MED ORDER — OXYBUTYNIN CHLORIDE ER 10 MG PO TB24: 10.0000 mg | ORAL_TABLET | Freq: Every day | ORAL | 0 refills | Status: DC

## 2021-11-08 NOTE — Progress Notes (Signed)
   Richard Swanson is a 87 y.o. male who presents today for an office visit.  Assessment/Plan:  Chronic Problems Addressed Today: OAB (overactive bladder) We started him on Myrbetriq about a year ago.  He is not sure if it is giving much benefit and is additionally expensive for him.  We will try oxybutynin instead.  He can follow-up with me in a few weeks via MyChart and we can adjust his dose as tolerated.  May consider having him follow back up with urology if this continues to be an issue.  Hypertension associated with diabetes (Richard Swanson) Blood pressure is well controlled in the office today however he has had intermittent issues with dizziness for the past several months.  His home readings have been ranging from the 90s over 40s to 120s over 70s.  He is likely having symptomatic lows at home.  We will stop his amlodipine.  Continue valsartan 40 mg daily and metoprolol succinate 25 mg daily.  He will follow-up with me in a few weeks.  Type 2 diabetes mellitus with diabetic nephropathy, without long-term current use of insulin (Richard Swanson) Continue management per endocrinology.       Subjective:  HPI:  See A/p for status of chronic conditions.         Objective:  Physical Exam: BP 132/70   Pulse 63   Temp 97.6 F (36.4 C) (Temporal)   Ht 6' (1.829 m)   Wt 183 lb (83 kg)   SpO2 98%   BMI 24.82 kg/m   Gen: No acute distress, resting comfortably CV: Regular rate and rhythm with no murmurs appreciated Pulm: Normal work of breathing, clear to auscultation bilaterally with no crackles, wheezes, or rhonchi Neuro: Grossly normal, moves all extremities Psych: Normal affect and thought content      Richard Swanson M. Jerline Pain, MD 11/08/2021 1:38 PM

## 2021-11-08 NOTE — Assessment & Plan Note (Signed)
We started him on Myrbetriq about a year ago.  He is not sure if it is giving much benefit and is additionally expensive for him.  We will try oxybutynin instead.  He can follow-up with me in a few weeks via MyChart and we can adjust his dose as tolerated.  May consider having him follow back up with urology if this continues to be an issue.

## 2021-11-08 NOTE — Assessment & Plan Note (Signed)
Continue management per endocrinology. 

## 2021-11-08 NOTE — Assessment & Plan Note (Signed)
Blood pressure is well controlled in the office today however he has had intermittent issues with dizziness for the past several months.  His home readings have been ranging from the 90s over 40s to 120s over 70s.  He is likely having symptomatic lows at home.  We will stop his amlodipine.  Continue valsartan 40 mg daily and metoprolol succinate 25 mg daily.  He will follow-up with me in a few weeks.

## 2021-11-08 NOTE — Patient Instructions (Signed)
It was very nice to see you today!  Please stop the amlodipine.  Start the oxybutynin and stop the Myrbetriq.  Please send a message in a few weeks to let me know how you are doing.  Take care, Dr Jerline Pain  PLEASE NOTE:  If you had any lab tests please let us know if you have not heard back within a few days. You may see your results on mychart before we have a chance to review them but we will give you a call once they are reviewed by [place]. If we ordered any referrals today, please let us know if you have not heard from their office within the next week.   Please try these tips to maintain a healthy lifestyle:  Eat at least 3 REAL meals and 1-2 snacks per day.  Aim for no more than 5 hours between eating.  If you eat breakfast, please do so within one hour of getting up.   Each meal should contain half fruits/vegetables, one quarter protein, and one quarter carbs (no bigger than a computer mouse)  Cut down on sweet beverages. This includes juice, soda, and sweet tea.   Drink at least 1 glass of water with each meal and aim for at least 8 glasses per day  Exercise at least 150 minutes every week.

## 2021-12-07 ENCOUNTER — Other Ambulatory Visit: Payer: Self-pay | Admitting: Family Medicine

## 2021-12-07 DIAGNOSIS — I10 Essential (primary) hypertension: Secondary | ICD-10-CM

## 2021-12-15 ENCOUNTER — Other Ambulatory Visit: Payer: Self-pay | Admitting: Nurse Practitioner

## 2021-12-17 DIAGNOSIS — E1122 Type 2 diabetes mellitus with diabetic chronic kidney disease: Secondary | ICD-10-CM | POA: Diagnosis not present

## 2021-12-17 DIAGNOSIS — N189 Chronic kidney disease, unspecified: Secondary | ICD-10-CM | POA: Diagnosis not present

## 2021-12-17 DIAGNOSIS — I129 Hypertensive chronic kidney disease with stage 1 through stage 4 chronic kidney disease, or unspecified chronic kidney disease: Secondary | ICD-10-CM | POA: Diagnosis not present

## 2021-12-17 DIAGNOSIS — R809 Proteinuria, unspecified: Secondary | ICD-10-CM | POA: Diagnosis not present

## 2021-12-17 DIAGNOSIS — E1129 Type 2 diabetes mellitus with other diabetic kidney complication: Secondary | ICD-10-CM | POA: Diagnosis not present

## 2021-12-20 ENCOUNTER — Other Ambulatory Visit: Payer: Self-pay | Admitting: Family Medicine

## 2021-12-20 DIAGNOSIS — D631 Anemia in chronic kidney disease: Secondary | ICD-10-CM | POA: Diagnosis not present

## 2021-12-20 DIAGNOSIS — E1122 Type 2 diabetes mellitus with diabetic chronic kidney disease: Secondary | ICD-10-CM | POA: Diagnosis not present

## 2021-12-20 DIAGNOSIS — R809 Proteinuria, unspecified: Secondary | ICD-10-CM | POA: Diagnosis not present

## 2021-12-20 DIAGNOSIS — E1129 Type 2 diabetes mellitus with other diabetic kidney complication: Secondary | ICD-10-CM | POA: Diagnosis not present

## 2021-12-20 DIAGNOSIS — I129 Hypertensive chronic kidney disease with stage 1 through stage 4 chronic kidney disease, or unspecified chronic kidney disease: Secondary | ICD-10-CM | POA: Diagnosis not present

## 2021-12-20 DIAGNOSIS — N189 Chronic kidney disease, unspecified: Secondary | ICD-10-CM | POA: Diagnosis not present

## 2022-01-05 ENCOUNTER — Other Ambulatory Visit: Payer: Self-pay | Admitting: Family Medicine

## 2022-01-15 ENCOUNTER — Ambulatory Visit: Payer: Medicare Other | Admitting: Nurse Practitioner

## 2022-01-15 ENCOUNTER — Encounter: Payer: Self-pay | Admitting: Nurse Practitioner

## 2022-01-15 VITALS — BP 129/72 | HR 56 | Ht 72.0 in | Wt 185.0 lb

## 2022-01-15 DIAGNOSIS — E1159 Type 2 diabetes mellitus with other circulatory complications: Secondary | ICD-10-CM

## 2022-01-15 DIAGNOSIS — I10 Essential (primary) hypertension: Secondary | ICD-10-CM | POA: Diagnosis not present

## 2022-01-15 DIAGNOSIS — E1121 Type 2 diabetes mellitus with diabetic nephropathy: Secondary | ICD-10-CM

## 2022-01-15 DIAGNOSIS — I152 Hypertension secondary to endocrine disorders: Secondary | ICD-10-CM | POA: Diagnosis not present

## 2022-01-15 DIAGNOSIS — E782 Mixed hyperlipidemia: Secondary | ICD-10-CM

## 2022-01-15 LAB — POCT GLYCOSYLATED HEMOGLOBIN (HGB A1C): Hemoglobin A1C: 8.2 % — AB (ref 4.0–5.6)

## 2022-01-15 MED ORDER — LINAGLIPTIN 5 MG PO TABS: 5.0000 mg | ORAL_TABLET | Freq: Every day | ORAL | 1 refills | Status: DC

## 2022-01-15 NOTE — Progress Notes (Signed)
01/15/2022, 11:56 AM   Endocrinology follow-up note  Subjective:    Patient ID: Richard Swanson, male    DOB: 1935-03-28.  Richard Swanson is being seen in follow-up after he was seen in consultation for management of currently uncontrolled symptomatic diabetes requested by  Vivi Barrack, MD.   Past Medical History:  Diagnosis Date   Cancer [facility])    Prostate   Chronic obstructive pulmonary disease, unspecified ([place])    Diabetes mellitus without complication ([place])    Dizziness    Gastro-esophageal reflux disease without esophagitis    Glaucoma    Glomerular disorders in diseases classified elsewhere    Gross hematuria    Hypertensive chronic kidney disease with stage 1 through stage 4 chronic kidney disease, or unspecified chronic kidney disease    Kidney disease, chronic, stage III (moderate, EGFR 30-59 ml/min) ([place])    Malignant neoplasm of prostate ([place])    Prostate cancer ([place]) 11/03/2010   dx 2006   Type 2 diabetes mellitus with diabetic nephropathy ([place])    Unspecified kidney failure     Past Surgical History:  Procedure Laterality Date   PROSTATE SURGERY      Social History   Socioeconomic History   Marital status: Married    Spouse name: Not on file   Number of children: Not on file   Years of education: Not on file   Highest education level: Not on file  Occupational History   Occupation: Retired  Tobacco Use   Smoking status: Never   Smokeless tobacco: Never  Vaping Use   Vaping Use: Never used  Substance and Sexual Activity   Alcohol use: No   Drug use: No   Sexual activity: Not on file  Other Topics Concern   Not on file  Social History Narrative   Not on file   Social Determinants of Health   Financial Resource Strain: Low Risk  (04/16/2021)   Overall Financial Resource Strain (CARDIA)    Difficulty of Paying Living Expenses: Not hard at all  Food Insecurity: No  Food Insecurity (04/16/2021)   Hunger Vital Sign    Worried About Running Out of Food in the Last Year: Never true    Ran Out of Food in the Last Year: Never true  Transportation Needs: No Transportation Needs (04/16/2021)   PRAPARE - Hydrologist (Medical): No    Lack of Transportation (Non-Medical): No  Physical Activity: Sufficiently Active (04/16/2021)   Exercise Vital Sign    Days of Exercise per Week: 3 days    Minutes of Exercise per Session: 60 min  Stress: No Stress Concern Present (04/16/2021)   [place]    Feeling of Stress : Not at all  Social Connections: [place] (04/16/2021)   Social Connection and Isolation Panel [NHANES]    Frequency of Communication with Friends and Family: Once a week    Frequency of Social Gatherings with Friends and Family: Three times a week    Attends Religious Services: More than 4 times per year    Active Member of Clubs or Organizations: Yes    Attends  Club or Organization Meetings: 1 to 4 times per year    Marital Status: Married    Family History  Problem Relation Age of Onset   Stroke Father     Outpatient Encounter Medications as of 01/15/2022  Medication Sig   glipiZIDE (GLUCOTROL XL) 5 MG 24 hr tablet TAKE (2) TABLETS BY MOUTH DAILY BREAKFAST.   latanoprost (XALATAN) 0.005 % ophthalmic solution 1 drop at bedtime.   linagliptin (TRADJENTA) 5 MG TABS tablet Take 1 tablet (5 mg total) by mouth daily.   metoprolol succinate (TOPROL-XL) 25 MG 24 hr tablet TAKE 1 TABLET BY MOUTH EVERY DAY.   MYRBETRIQ 25 MG TB24 tablet TAKE ONE TABLET BY MOUTH ONCE DAILY.   rosuvastatin (CRESTOR) 5 MG tablet TAKE ONE TABLET BY MOUTH ONCE DAILY.   traMADol (ULTRAM) 50 MG tablet TAKE (1/2) TABLET BY MOUTH EVERY 12 HOURS AS NEEDED.   valsartan (DIOVAN) 40 MG tablet Take 40 mg by mouth daily.   VYZULTA 0.024 % SOLN Apply 1 drop to eye at bedtime.    [DISCONTINUED] JANUVIA 50 MG tablet TAKE ONE TABLET BY MOUTH ONCE DAILY.   [DISCONTINUED] oxybutynin (DITROPAN-XL) 10 MG 24 hr tablet Take 1 tablet (10 mg total) by mouth at bedtime. (Patient not taking: Reported on 01/15/2022)   No facility-administered encounter medications on file as of 01/15/2022.    ALLERGIES: No Known Allergies  VACCINATION STATUS: Immunization History  Administered Date(s) Administered   Fluad Quad(high Dose 65+) 02/14/2020, 02/20/2021   Influenza, High Dose Seasonal PF 03/23/2018   Influenza,inj,Quad PF,6+ Mos 01/30/2016, 01/29/2017, 01/28/2019   Moderna SARS-COV2 Booster Vaccination 03/16/2020   Moderna Sars-Covid-2 Vaccination 06/05/2019, 07/06/2019   Pneumococcal Conjugate-13 04/06/2020   Pneumococcal Polysaccharide-23 01/28/2019    Diabetes He presents for his follow-up diabetic visit. He has type 2 diabetes mellitus. Onset time: He was diagnosed at approximate age of 23 years. His disease course has been fluctuating. There are no hypoglycemic associated symptoms. Pertinent negatives for hypoglycemia include no confusion, headaches, pallor or seizures. There are no diabetic associated symptoms. Pertinent negatives for diabetes include no chest pain, no fatigue, no polydipsia, no polyphagia, no polyuria and no weakness. There are no hypoglycemic complications. Symptoms are stable. Diabetic complications include nephropathy, peripheral neuropathy and PVD. Risk factors for coronary artery disease include dyslipidemia, diabetes mellitus, hypertension, male sex and sedentary lifestyle. Current diabetic treatment includes oral agent (dual therapy). He is compliant with treatment most of the time. His weight is fluctuating minimally. He is following a generally healthy diet. When asked about meal planning, he reported none. He has not had a previous visit with a dietitian. He rarely participates in exercise. His home blood glucose trend is fluctuating minimally. His  breakfast blood glucose range is generally 140-180 mg/dl. (He presents today with his logs, no meter, showing slightly above target fasting glycemic profile.  His POCT A1c today is 8.2%, increasing from last visit of 7.8%.  He admits he has been snacking more.  He denies any hypoglycemia.) An ACE inhibitor/angiotensin II receptor blocker is being taken. He does not see a podiatrist.Eye exam is current.  Hyperlipidemia This is a chronic problem. The current episode started more than 1 year ago. The problem is controlled. Recent lipid tests were reviewed and are normal. Exacerbating diseases include chronic renal disease and diabetes. Factors aggravating his hyperlipidemia include beta blockers. Pertinent negatives include no chest pain, myalgias or shortness of breath. Current antihyperlipidemic treatment includes statins. The current treatment provides mild improvement of lipids. There are  no compliance problems.  Risk factors for coronary artery disease include dyslipidemia, diabetes mellitus, hypertension, male sex and a sedentary lifestyle.  Hypertension This is a chronic problem. The current episode started more than 1 year ago. The problem has been gradually improving since onset. The problem is controlled. Pertinent negatives include no chest pain, headaches, neck pain, palpitations or shortness of breath. There are no associated agents to hypertension. Risk factors for coronary artery disease include diabetes mellitus, dyslipidemia, male gender and sedentary lifestyle. Past treatments include ACE inhibitors, beta blockers and calcium channel blockers. The current treatment provides significant improvement. There are no compliance problems.  Hypertensive end-organ damage includes kidney disease and PVD. Identifiable causes of hypertension include chronic renal disease.     Review of systems  Constitutional: + stable body weight,  current Body mass index is 25.09 kg/m. , no fatigue, no subjective  hyperthermia, no subjective hypothermia Eyes: no blurry vision, no xerophthalmia ENT: no sore throat, no nodules palpated in throat, no dysphagia/odynophagia, no hoarseness Cardiovascular: no chest pain, no shortness of breath, no palpitations, right leg swelling Respiratory: no cough, no shortness of breath Gastrointestinal: no nausea/vomiting/diarrhea Musculoskeletal: no muscle/joint aches Skin: no rashes, no hyperemia Neurological: no tremors, no numbness, no tingling, no dizziness Psychiatric: no depression, no anxiety   Objective:    BP 129/72 (BP Location: Left Arm, Patient Position: Sitting, Cuff Size: Large)   Pulse (!) 56   Ht 6' (1.829 m)   Wt 185 lb (83.9 kg)   BMI 25.09 kg/m   Wt Readings from Last 3 Encounters:  01/15/22 185 lb (83.9 kg)  11/08/21 183 lb (83 kg)  09/12/21 185 lb (83.9 kg)    BP Readings from Last 3 Encounters:  01/15/22 129/72  11/08/21 132/70  09/12/21 (!) 157/67     Physical Exam- Limited  Constitutional:  Body mass index is 25.09 kg/m. , not in acute distress, normal state of mind Eyes:  EOMI, no exophthalmos Neck: Supple Cardiovascular: RRR, no murmurs, rubs, or gallops, no edema Respiratory: Adequate breathing efforts, no crackles, rales, rhonchi, or wheezing Musculoskeletal: no gross deformities, strength intact in all four extremities, no gross restriction of joint movements Skin:  no rashes, no hyperemia Neurological: no tremor with outstretched hands    CMP     Component Value Date/Time   NA 139 05/03/2021 1353   NA 140 05/14/2019 0000   K 4.3 05/03/2021 1353   CL 105 05/03/2021 1353   CO2 29 05/03/2021 1353   GLUCOSE 202 (H) 05/03/2021 1353   BUN 29 (H) 05/03/2021 1353   BUN 26 (A) 05/14/2019 0000   CREATININE 2.29 (H) 05/03/2021 1353   CREATININE 2.23 (H) 04/06/2020 1206   CALCIUM 9.5 05/03/2021 1353   PROT 7.5 05/03/2021 1353   ALBUMIN 4.1 05/03/2021 1353   AST 21 05/03/2021 1353   ALT 20 05/03/2021 1353    ALKPHOS 49 05/03/2021 1353   BILITOT 0.6 05/03/2021 1353   GFRNONAA 29 (L) 02/13/2021 1338   GFRAA 29 (L) 07/29/2019 1115    Diabetic Labs (most recent): Lab Results  Component Value Date   HGBA1C 8.2 (A) 01/15/2022   HGBA1C 7.8 09/12/2021   HGBA1C 8.0 (A) 05/03/2021     Lipid Panel ( most recent) Lipid Panel     Component Value Date/Time   CHOL 130 05/03/2021 1353   TRIG 96.0 05/03/2021 1353   HDL 46.30 05/03/2021 1353   CHOLHDL 3 05/03/2021 1353   VLDL 19.2 05/03/2021 1353   LDLCALC 64  05/03/2021 1353   Protivin 04/06/2020 1206      Lab Results  Component Value Date   TSH 1.09 05/03/2021   TSH 0.49 04/06/2020   TSH 1.48 05/05/2017      Assessment & Plan:   1) Type 2 diabetes mellitus with diabetic nephropathy, without long-term current use of insulin (HCC)  - Richard Swanson has currently uncontrolled symptomatic type 2 DM since 87 years of age.  He presents today with his logs, no meter, showing slightly above target fasting glycemic profile.  His POCT A1c today is 8.2%, increasing from last visit of 7.8%.  He admits he has been snacking more.  He denies any hypoglycemia.  - Recent labs reviewed.  - I had a long discussion with him about the progressive nature of diabetes and the pathology behind its complications. -his diabetes is complicated by CKD and he remains at a high risk for more acute and chronic complications which include CAD, CVA, CKD, retinopathy, and neuropathy. These are all discussed in detail with him.  - Nutritional counseling repeated at each appointment due to patients tendency to fall back in to old habits.  - The patient admits there is a room for improvement in their diet and drink choices. -  Suggestion is made for the patient to avoid simple carbohydrates from their diet including Cakes, Sweet Desserts / Pastries, Ice Cream, Soda (diet and regular), Sweet Tea, Candies, Chips, Cookies, Sweet Pastries, Store Bought Juices, Alcohol in  Excess of 1-2 drinks a day, Artificial Sweeteners, Coffee Creamer, and "Sugar-free" Products. This will help patient to have stable blood glucose profile and potentially avoid unintended weight gain.   - I encouraged the patient to switch to unprocessed or minimally processed complex starch and increased protein intake (animal or plant source), fruits, and vegetables.   - Patient is advised to stick to a routine mealtimes to eat 3 meals a day and avoid unnecessary snacks (to snack only to correct hypoglycemia).  - he sees Jearld Fenton, RDE routinely for diabetes education and meal planning.  - I have approached him with the following individualized plan to manage  his diabetes and patient agrees:   -Although his A1c is slightly worse today, no changes will be made to his medications.  He will be given the opportunity to get back on track with changing up his meal regimen and stop snacking.  He is advised to continue Januvia 50 mg po daily and Glipizide 10 mg XL daily with breakfast.    -He is encouraged to continue monitoring blood glucose once daily, before breakfast and to call the clinic if he has readings less than 70 or greater than 200 for 3 tests in a row.  - he is not a candidate for metformin, SGLT2 inhibitors, or full dose Januvia due to concurrent renal insufficiency.  - Specific targets for  A1c;  LDL, HDL,  and Triglycerides were discussed with the patient.  2) Blood Pressure /Hypertension:  His blood pressure is controlled to target for his age. He is advised to continue Metoprolol 25 mg p.o. daily and Amlodipine 2.5 mg p.o. daily.  3) Lipids/Hyperlipidemia:   His most recent lipid panel from 05/03/21 shows controlled LDL of 64.  He is advised to continue Crestor 5 mg po daily at bedtime.  Side effects and precautions discussed with him.   4)  Weight/Diet: His Body mass index is 25.09 kg/m.  -  he is not a candidate for weight loss.  Exercise, and detailed  carbohydrates  information provided  -  detailed on discharge instructions.  5) Chronic Care/Health Maintenance: -he  is on ACEI/ARB and Statin medications and is encouraged to initiate and continue to follow up with Ophthalmology, Dentist, Nephrologist, and Podiatrist at least yearly or according to recommendations, and advised to stay away from smoking. I have recommended yearly flu vaccine and pneumonia vaccine at least every 5 years; moderate intensity exercise for up to 150 minutes weekly; and  sleep for at least 7 hours a day.  - he is  advised to maintain close follow up with Vivi Barrack, MD for primary care needs, as well as his other providers for optimal and coordinated care.    He is concerned about his right leg swelling.  He has used compression stockings in the past with some relief.  He is advised to mention his leg swelling to his nephrologist to see if anything can be given to help (may not be candidate for diuretic due to CKD stage 4).      I spent 41 minutes in the care of the patient today including review of labs from [place], Lipids, Thyroid Function, Hematology (current and previous including abstractions from other facilities); face-to-face time discussing  his blood glucose readings/logs, discussing hypoglycemia and hyperglycemia episodes and symptoms, medications doses, his options of short and long term treatment based on the latest standards of care / guidelines;  discussion about incorporating lifestyle medicine;  and documenting the encounter. Risk reduction counseling performed per USPSTF guidelines to reduce obesity and cardiovascular risk factors.     Please refer to Patient Instructions for Blood Glucose Monitoring and Insulin/Medications Dosing Guide"  in media tab for additional information. Please  also refer to " Patient Self Inventory" in the Media  tab for reviewed elements of pertinent patient history.  Richard Swanson participated in the discussions, expressed  understanding, and voiced agreement with the above plans.  All questions were answered to his satisfaction. he is encouraged to contact clinic should he have any questions or concerns prior to his return visit.   Follow up plan: - Return in about 4 months (around 05/17/2022) for Diabetes F/U with A1c in office, No previsit labs, Bring meter and logs.  Rayetta Pigg, [facility] [address] [place], [place] [address] Phone: [phone] Fax: [phone]   01/15/2022, 11:56 AM

## 2022-01-21 ENCOUNTER — Encounter: Payer: Self-pay | Admitting: *Deleted

## 2022-01-21 ENCOUNTER — Other Ambulatory Visit: Payer: Self-pay | Admitting: Family Medicine

## 2022-01-23 DIAGNOSIS — H401131 Primary open-angle glaucoma, bilateral, mild stage: Secondary | ICD-10-CM | POA: Diagnosis not present

## 2022-01-23 DIAGNOSIS — Z961 Presence of intraocular lens: Secondary | ICD-10-CM | POA: Diagnosis not present

## 2022-01-30 ENCOUNTER — Other Ambulatory Visit: Payer: Self-pay | Admitting: Nurse Practitioner

## 2022-01-30 DIAGNOSIS — E782 Mixed hyperlipidemia: Secondary | ICD-10-CM

## 2022-02-04 ENCOUNTER — Other Ambulatory Visit: Payer: Self-pay | Admitting: Nurse Practitioner

## 2022-02-12 ENCOUNTER — Other Ambulatory Visit: Payer: Self-pay | Admitting: "Endocrinology

## 2022-02-14 ENCOUNTER — Telehealth: Payer: Self-pay | Admitting: *Deleted

## 2022-02-14 NOTE — Patient Outreach (Signed)
  Care Coordination   02/14/2022 Name: Richard Swanson MRN: [id number] DOB: [birth year]   Care Coordination Outreach Attempts:  An unsuccessful telephone outreach was attempted today to offer the patient information about available care coordination services as a benefit of their health plan.   Follow Up Plan:  Additional outreach attempts will be made to offer the patient care coordination information and services.   Encounter Outcome:  No Answer  Care Coordination Interventions Activated:  No   Care Coordination Interventions:  No, not indicated    Raina Mina, RN Care Management Coordinator [place] Office [phone]

## 2022-02-15 ENCOUNTER — Other Ambulatory Visit: Payer: Self-pay

## 2022-02-15 DIAGNOSIS — C61 Malignant neoplasm of prostate: Secondary | ICD-10-CM

## 2022-02-18 ENCOUNTER — Inpatient Hospital Stay: Payer: Medicare Other | Attending: Hematology

## 2022-02-18 DIAGNOSIS — Z23 Encounter for immunization: Secondary | ICD-10-CM | POA: Diagnosis not present

## 2022-02-18 DIAGNOSIS — N183 Chronic kidney disease, stage 3 unspecified: Secondary | ICD-10-CM | POA: Diagnosis not present

## 2022-02-18 DIAGNOSIS — C61 Malignant neoplasm of prostate: Secondary | ICD-10-CM | POA: Diagnosis not present

## 2022-02-18 DIAGNOSIS — D649 Anemia, unspecified: Secondary | ICD-10-CM | POA: Diagnosis not present

## 2022-02-18 LAB — COMPREHENSIVE METABOLIC PANEL
ALT: 20 U/L (ref 0–44)
AST: 21 U/L (ref 15–41)
Albumin: 3.9 g/dL (ref 3.5–5.0)
Alkaline Phosphatase: 51 U/L (ref 38–126)
Anion gap: 7 (ref 5–15)
BUN: 34 mg/dL — ABNORMAL HIGH (ref 8–23)
CO2: 27 mmol/L (ref 22–32)
Calcium: 8.8 mg/dL — ABNORMAL LOW (ref 8.9–10.3)
Chloride: 102 mmol/L (ref 98–111)
Creatinine, Ser: 2.37 mg/dL — ABNORMAL HIGH (ref 0.61–1.24)
GFR, Estimated: 26 mL/min — ABNORMAL LOW (ref >60)
Glucose, Bld: 194 mg/dL — ABNORMAL HIGH (ref 70–99)
Potassium: 4 mmol/L (ref 3.5–5.1)
Sodium: 136 mmol/L (ref 135–145)
Total Bilirubin: 0.7 mg/dL (ref 0.3–1.2)
Total Protein: 7.4 g/dL (ref 6.5–8.1)

## 2022-02-18 LAB — FERRITIN: Ferritin: 103 ng/mL (ref 24–336)

## 2022-02-18 LAB — IRON AND TIBC
Iron: 66 ug/dL (ref 45–182)
Saturation Ratios: 23 % (ref 17.9–39.5)
TIBC: 289 ug/dL (ref 250–450)
UIBC: 223 ug/dL

## 2022-02-18 LAB — CBC WITH DIFFERENTIAL/PLATELET
Abs Immature Granulocytes: 0.03 10*3/uL (ref 0.00–0.07)
Basophils Absolute: 0.1 10*3/uL (ref 0.0–0.1)
Basophils Relative: 1 %
Eosinophils Absolute: 0.2 10*3/uL (ref 0.0–0.5)
Eosinophils Relative: 3 %
HCT: 35.6 % — ABNORMAL LOW (ref 39.0–52.0)
Hemoglobin: 12.4 g/dL — ABNORMAL LOW (ref 13.0–17.0)
Immature Granulocytes: 1 %
Lymphocytes Relative: 21 %
Lymphs Abs: 1 10*3/uL (ref 0.7–4.0)
MCH: 27.3 pg (ref 26.0–34.0)
MCHC: 34.8 g/dL (ref 30.0–36.0)
MCV: 78.4 fL — ABNORMAL LOW (ref 80.0–100.0)
Monocytes Absolute: 0.4 10*3/uL (ref 0.1–1.0)
Monocytes Relative: 9 %
Neutro Abs: 3 10*3/uL (ref 1.7–7.7)
Neutrophils Relative %: 65 %
Platelets: 147 10*3/uL — ABNORMAL LOW (ref 150–400)
RBC: 4.54 MIL/uL (ref 4.22–5.81)
RDW: 13 % (ref 11.5–15.5)
WBC: 4.7 10*3/uL (ref 4.0–10.5)
nRBC: 0 % (ref 0.0–0.2)

## 2022-02-18 LAB — PSA: Prostatic Specific Antigen: <0.01 ng/mL (ref 0.00–4.00)

## 2022-02-20 ENCOUNTER — Telehealth: Payer: Self-pay | Admitting: *Deleted

## 2022-02-20 NOTE — Patient Outreach (Signed)
  Care Coordination   02/20/2022 Name: Richard Swanson MRN: [id number] DOB: 11-07-1934   Care Coordination Outreach Attempts:  A second unsuccessful outreach was attempted today to offer the patient with information about available care coordination services as a benefit of their health plan.     Follow Up Plan:  Additional outreach attempts will be made to offer the patient care coordination information and services.   Encounter Outcome:  No Answer  Care Coordination Interventions Activated:  No   Care Coordination Interventions:  No, not indicated    Raina Mina, RN Care Management Coordinator [place] Office [phone]

## 2022-02-25 ENCOUNTER — Inpatient Hospital Stay: Payer: Medicare Other

## 2022-02-25 ENCOUNTER — Inpatient Hospital Stay (HOSPITAL_BASED_OUTPATIENT_CLINIC_OR_DEPARTMENT_OTHER): Payer: Medicare Other | Admitting: Hematology

## 2022-02-25 ENCOUNTER — Encounter: Payer: Self-pay | Admitting: Hematology

## 2022-02-25 VITALS — BP 138/65 | HR 70 | Temp 98.1°F | Resp 18 | Wt 183.0 lb

## 2022-02-25 DIAGNOSIS — N183 Chronic kidney disease, stage 3 unspecified: Secondary | ICD-10-CM | POA: Diagnosis not present

## 2022-02-25 DIAGNOSIS — D649 Anemia, unspecified: Secondary | ICD-10-CM | POA: Diagnosis not present

## 2022-02-25 DIAGNOSIS — Z23 Encounter for immunization: Secondary | ICD-10-CM

## 2022-02-25 DIAGNOSIS — C61 Malignant neoplasm of prostate: Secondary | ICD-10-CM

## 2022-02-25 MED ORDER — INFLUENZA VAC A&B SA ADJ QUAD 0.5 ML IM PRSY
0.5000 mL | PREFILLED_SYRINGE | Freq: Once | INTRAMUSCULAR | Status: AC
  Administered 2022-02-25: 0.5 mL via INTRAMUSCULAR
  Filled 2022-02-25: qty 0.5

## 2022-02-25 NOTE — Patient Instructions (Signed)
[place]  Discharge Instructions  You were seen and examined today by Dr. Delton Coombes.  Dr. Delton Coombes discussed your most recent lab work which revealed that everything looks good except your iron is slightly low.  Start taking over the counter Iron Monday, Wednesday and Friday.  Follow-up as scheduled in 1 year.    Thank you for choosing [facility] to provide your oncology and hematology care.   To afford each patient quality time with our provider, please arrive at least 15 minutes before your scheduled appointment time. You may need to reschedule your appointment if you arrive late (10 or more minutes). Arriving late affects you and other patients whose appointments are after yours.  Also, if you miss three or more appointments without notifying the office, you may be dismissed from the clinic at the provider's discretion.    Again, thank you for choosing [facility].  Our hope is that these requests will decrease the amount of time that you wait before being seen by our physicians.   If you have a lab appointment with the [facility] please come in thru the Main Entrance and check in at the main information desk.           _____________________________________________________________  Should you have questions after your visit to [facility], please contact our office at [phone] and follow the prompts.  Our office hours are 8:00 a.m. to 4:30 p.m. Monday - Thursday and 8:00 a.m. to 2:30 p.m. Friday.  Please note that voicemails left after 4:00 p.m. may not be returned until the following business day.  We are closed weekends and all major holidays.  You do have access to a nurse 24-7, just call the main number to the clinic [phone] and do not press any options, hold on the line and a nurse will answer the phone.    For prescription refill requests, have your pharmacy contact our office and  allow 72 hours.    Masks are optional in the cancer centers. If you would like for your care team to wear a mask while they are taking care of you, please let them know. You may have one support person who is at least 85 years old accompany you for your appointments.

## 2022-02-25 NOTE — Progress Notes (Signed)
[place], [place] [address]   CLINIC:  Medical Oncology/Hematology  PCP:  Vivi Barrack, MD [address] / [place] [address] [phone]   REASON FOR VISIT:  Follow-up for prostate cancer  PRIOR THERAPY:  1. Radical prostatectomy in 06/2004. 2. Radiation from 08/01/2010 to 09/14/2010.  NGS Results: not done  CURRENT THERAPY: surveillance  BRIEF ONCOLOGIC HISTORY:  Oncology History   No history exists.    CANCER STAGING:  Cancer Staging  No matching staging information was found for the patient.  INTERVAL HISTORY:  Mr. Izaia Say, a 87 y.o. male, returns for follow-up of prostate cancer and anemia.  Denies any bleeding per rectum or melena.  No new onset pains reported.  No recent infections.  REVIEW OF SYSTEMS:  Review of Systems  Constitutional:  Negative for appetite change and fatigue (80%).  Cardiovascular:  Negative for leg swelling.  Gastrointestinal:  Negative for blood in stool.  Genitourinary:  Negative for hematuria.   All other systems reviewed and are negative.   PAST MEDICAL/SURGICAL HISTORY:  Past Medical History:  Diagnosis Date   Cancer [facility])    Prostate   Chronic obstructive pulmonary disease, unspecified ([place])    Diabetes mellitus without complication (HCC)    Dizziness    Gastro-esophageal reflux disease without esophagitis    Glaucoma    Glomerular disorders in diseases classified elsewhere    Gross hematuria    Hypertensive chronic kidney disease with stage 1 through stage 4 chronic kidney disease, or unspecified chronic kidney disease    Kidney disease, chronic, stage III (moderate, EGFR 30-59 ml/min) (HCC)    Malignant neoplasm of prostate ([place])    Prostate cancer ([place]) 11/03/2010   dx 2006   Type 2 diabetes mellitus with diabetic nephropathy (HCC)    Unspecified kidney failure    Past Surgical History:  Procedure Laterality Date   PROSTATE SURGERY      SOCIAL HISTORY:   Social History   Socioeconomic History   Marital status: Married    Spouse name: Not on file   Number of children: Not on file   Years of education: Not on file   Highest education level: Not on file  Occupational History   Occupation: Retired  Tobacco Use   Smoking status: Never   Smokeless tobacco: Never  Vaping Use   Vaping Use: Never used  Substance and Sexual Activity   Alcohol use: No   Drug use: No   Sexual activity: Not on file  Other Topics Concern   Not on file  Social History Narrative   Not on file   Social Determinants of Health   Financial Resource Strain: Low Risk  (04/16/2021)   Overall Financial Resource Strain (CARDIA)    Difficulty of Paying Living Expenses: Not hard at all  Food Insecurity: No Food Insecurity (04/16/2021)   Hunger Vital Sign    Worried About Running Out of Food in the Last Year: Never true    Ran Out of Food in the Last Year: Never true  Transportation Needs: No Transportation Needs (04/16/2021)   PRAPARE - Hydrologist (Medical): No    Lack of Transportation (Non-Medical): No  Physical Activity: Sufficiently Active (04/16/2021)   Exercise Vital Sign    Days of Exercise per Week: 3 days    Minutes of Exercise per Session: 60 min  Stress: No Stress Concern Present (04/16/2021)   [place] -  Occupational Stress Questionnaire    Feeling of Stress : Not at all  Social Connections: Socially Integrated (04/16/2021)   Social Connection and Isolation Panel [NHANES]    Frequency of Communication with Friends and Family: Once a week    Frequency of Social Gatherings with Friends and Family: Three times a week    Attends Religious Services: More than 4 times per year    Active Member of Clubs or Organizations: Yes    Attends Archivist Meetings: 1 to 4 times per year    Marital Status: Married  Human resources officer Violence: Not At Risk (04/16/2021)   Humiliation, Afraid,  Rape, and Kick questionnaire    Fear of Current or Ex-Partner: No    Emotionally Abused: No    Physically Abused: No    Sexually Abused: No    FAMILY HISTORY:  Family History  Problem Relation Age of Onset   Stroke Father     CURRENT MEDICATIONS:  Current Outpatient Medications  Medication Sig Dispense Refill   glipiZIDE (GLUCOTROL XL) 5 MG 24 hr tablet TAKE (2) TABLETS BY MOUTH DAILY BREAKFAST. 180 tablet 0   JANUVIA 50 MG tablet TAKE ONE TABLET BY MOUTH ONCE DAILY. 30 tablet 0   latanoprost (XALATAN) 0.005 % ophthalmic solution 1 drop at bedtime.     linagliptin (TRADJENTA) 5 MG TABS tablet Take 1 tablet (5 mg total) by mouth daily. 30 tablet 1   metoprolol succinate (TOPROL-XL) 25 MG 24 hr tablet TAKE 1 TABLET BY MOUTH EVERY DAY. 90 tablet 0   MYRBETRIQ 25 MG TB24 tablet TAKE ONE TABLET BY MOUTH ONCE DAILY. 30 tablet 0   rosuvastatin (CRESTOR) 5 MG tablet TAKE ONE TABLET BY MOUTH ONCE DAILY. 90 tablet 0   valsartan (DIOVAN) 40 MG tablet Take 40 mg by mouth daily.     VYZULTA 0.024 % SOLN Apply 1 drop to eye at bedtime.     amLODipine (NORVASC) 5 MG tablet Take 5 mg by mouth daily.     No current facility-administered medications for this visit.    ALLERGIES:  No Known Allergies  PHYSICAL EXAM:  Performance status (ECOG): 1 - Symptomatic but completely ambulatory  Vitals:   02/25/22 1535  BP: 138/65  Pulse: 70  Resp: 18  Temp: 98.1 F (36.7 C)  SpO2: 100%   Wt Readings from Last 3 Encounters:  02/25/22 183 lb (83 kg)  01/15/22 185 lb (83.9 kg)  11/08/21 183 lb (83 kg)   Physical Exam Vitals reviewed.  Constitutional:      Appearance: Normal appearance.  Cardiovascular:     Rate and Rhythm: Normal rate and regular rhythm.     Pulses: Normal pulses.     Heart sounds: Normal heart sounds.  Pulmonary:     Effort: Pulmonary effort is normal.     Breath sounds: Normal breath sounds.  Neurological:     General: No focal deficit present.     Mental Status: He  is alert and oriented to person, place, and time.  Psychiatric:        Mood and Affect: Mood normal.        Behavior: Behavior normal.      LABORATORY DATA:  I have reviewed the labs as listed.     Latest Ref Rng & Units 02/18/2022    1:27 PM 05/03/2021    1:53 PM 02/13/2021    1:38 PM  CBC  WBC 4.0 - 10.5 K/uL 4.7  4.9  5.6   Hemoglobin 13.0 -  17.0 g/dL 12.4  12.0  13.2   Hematocrit 39.0 - 52.0 % 35.6  34.9  38.0   Platelets 150 - 400 K/uL 147  160.0  163       Latest Ref Rng & Units 02/18/2022    1:27 PM 05/03/2021    1:53 PM 02/13/2021    1:38 PM  CMP  Glucose 70 - 99 mg/dL 194  202  234   BUN 8 - 23 mg/dL 34  29  28   Creatinine 0.61 - 1.24 mg/dL 2.37  2.29  2.19   Sodium 135 - 145 mmol/L 136  139  136   Potassium 3.5 - 5.1 mmol/L 4.0  4.3  4.2   Chloride 98 - 111 mmol/L 102  105  103   CO2 22 - 32 mmol/L _0 Calcium 8.9 - 10.3 mg/dL 8.8  9.5  9.2   Total Protein 6.5 - 8.1 g/dL 7.4  7.5  8.2   Total Bilirubin 0.3 - 1.2 mg/dL 0.7  0.6  0.7   Alkaline Phos 38 - 126 U/L 51  49  65   AST 15 - 41 U/L _1 ALT 0 - 44 U/L _2 DIAGNOSTIC IMAGING:  I have independently reviewed the scans and discussed with the patient. No results found.   ASSESSMENT:  1.  Prostate cancer: -Gleason score 5, status post radical prostatectomy in March 2006 by Dr. Michela Pitcher. -Status post radiation by Dr. Valere Dross on 08/01/2010 through 09/14/2010   2.  CKD: -Followed by Dr. Theador Hawthorne.   PLAN:  1.  Prostate cancer: - He does not have any new onset pains. - PSA is undetectable.   2.  Normocytic anemia: - Anemia from CKD and relative iron deficiency. - Hemoglobin is 12.4 with MCV 78.  Ferritin is 103 and percent saturation 23. - Recommend starting iron tablet 3 times weekly. - RTC 1 year for follow-up with repeat labs.    Orders placed this encounter:  Orders Placed This Encounter  Procedures   CBC with Differential/Platelet   Comprehensive metabolic panel    Ferritin   Iron and TIBC   PSA      Derek Jack, MD [facility] [phone]

## 2022-02-25 NOTE — Progress Notes (Signed)
Flu vaccine given in r deltoid.  Patient tolerated well.  Site CDI.  Discharged to home in stable condition ambulatory.

## 2022-02-25 NOTE — Patient Instructions (Signed)

## 2022-03-05 ENCOUNTER — Other Ambulatory Visit: Payer: Self-pay | Admitting: Family Medicine

## 2022-03-15 ENCOUNTER — Other Ambulatory Visit: Payer: Self-pay | Admitting: Family Medicine

## 2022-03-15 DIAGNOSIS — I10 Essential (primary) hypertension: Secondary | ICD-10-CM

## 2022-03-25 DIAGNOSIS — R809 Proteinuria, unspecified: Secondary | ICD-10-CM | POA: Diagnosis not present

## 2022-03-25 DIAGNOSIS — I129 Hypertensive chronic kidney disease with stage 1 through stage 4 chronic kidney disease, or unspecified chronic kidney disease: Secondary | ICD-10-CM | POA: Diagnosis not present

## 2022-03-25 DIAGNOSIS — N189 Chronic kidney disease, unspecified: Secondary | ICD-10-CM | POA: Diagnosis not present

## 2022-03-25 DIAGNOSIS — E1122 Type 2 diabetes mellitus with diabetic chronic kidney disease: Secondary | ICD-10-CM | POA: Diagnosis not present

## 2022-03-25 DIAGNOSIS — D631 Anemia in chronic kidney disease: Secondary | ICD-10-CM | POA: Diagnosis not present

## 2022-04-12 ENCOUNTER — Other Ambulatory Visit: Payer: Self-pay | Admitting: Nurse Practitioner

## 2022-04-25 DIAGNOSIS — D631 Anemia in chronic kidney disease: Secondary | ICD-10-CM | POA: Diagnosis not present

## 2022-04-25 DIAGNOSIS — I129 Hypertensive chronic kidney disease with stage 1 through stage 4 chronic kidney disease, or unspecified chronic kidney disease: Secondary | ICD-10-CM | POA: Diagnosis not present

## 2022-04-25 DIAGNOSIS — N189 Chronic kidney disease, unspecified: Secondary | ICD-10-CM | POA: Diagnosis not present

## 2022-04-25 DIAGNOSIS — E1129 Type 2 diabetes mellitus with other diabetic kidney complication: Secondary | ICD-10-CM | POA: Diagnosis not present

## 2022-04-25 DIAGNOSIS — R809 Proteinuria, unspecified: Secondary | ICD-10-CM | POA: Diagnosis not present

## 2022-04-25 DIAGNOSIS — E1122 Type 2 diabetes mellitus with diabetic chronic kidney disease: Secondary | ICD-10-CM | POA: Diagnosis not present

## 2022-04-25 DIAGNOSIS — D696 Thrombocytopenia, unspecified: Secondary | ICD-10-CM | POA: Diagnosis not present

## 2022-04-30 ENCOUNTER — Other Ambulatory Visit: Payer: Self-pay | Admitting: Family Medicine

## 2022-05-03 ENCOUNTER — Other Ambulatory Visit: Payer: Self-pay | Admitting: Nurse Practitioner

## 2022-05-03 DIAGNOSIS — E782 Mixed hyperlipidemia: Secondary | ICD-10-CM

## 2022-05-06 ENCOUNTER — Ambulatory Visit (INDEPENDENT_AMBULATORY_CARE_PROVIDER_SITE_OTHER): Payer: Medicare Other

## 2022-05-06 VITALS — BP 138/72 | HR 66 | Temp 97.5°F | Wt 184.8 lb

## 2022-05-06 DIAGNOSIS — Z0000 Encounter for general adult medical examination without abnormal findings: Secondary | ICD-10-CM

## 2022-05-06 NOTE — Patient Instructions (Signed)
Richard Swanson , Thank you for taking time to come for your Medicare Wellness Visit. I appreciate your ongoing commitment to your health goals. Please review the following plan we discussed and let me know if I can assist you in the future.   These are the goals we discussed:  Goals      Patient Stated     Maintain health     Patient Stated     Maintain Diabetes and diet         This is a list of the screening recommended for you and due dates:  Health Maintenance  Topic Date Due   Yearly kidney health urinalysis for diabetes  Never done   DTaP/Tdap/Td vaccine (1 - Tdap) Never done   Zoster (Shingles) Vaccine (1 of 2) Never done   Eye exam for diabetics  02/20/2022   Medicare Annual Wellness Visit  04/16/2022   Complete foot exam   05/03/2022   Hemoglobin A1C  07/16/2022   Yearly kidney function blood test for diabetes  02/19/2023   Pneumonia Vaccine  Completed   Flu Shot  Completed   HPV Vaccine  Aged Out   COVID-19 Vaccine  Discontinued    Advanced directives: Please bring a copy of your health care power of attorney and living will to the office at your convenience.  Conditions/risks identified: get sugar lower   Next appointment: Follow up in one year for your annual wellness visit.   Preventive Care 86 Years and Older, Male  Preventive care refers to lifestyle choices and visits with your health care provider that can promote health and wellness. What does preventive care include? A yearly physical exam. This is also called an annual well check. Dental exams once or twice a year. Routine eye exams. Ask your health care provider how often you should have your eyes checked. Personal lifestyle choices, including: Daily care of your teeth and gums. Regular physical activity. Eating a healthy diet. Avoiding tobacco and drug use. Limiting alcohol use. Practicing safe sex. Taking low doses of aspirin every day. Taking vitamin and mineral supplements as recommended by  your health care provider. What happens during an annual well check? The services and screenings done by your health care provider during your annual well check will depend on your age, overall health, lifestyle risk factors, and family history of disease. Counseling  Your health care provider may ask you questions about your: Alcohol use. Tobacco use. Drug use. Emotional well-being. Home and relationship well-being. Sexual activity. Eating habits. History of falls. Memory and ability to understand (cognition). Work and work Statistician. Screening  You may have the following tests or measurements: Height, weight, and BMI. Blood pressure. Lipid and cholesterol levels. These may be checked every 5 years, or more frequently if you are over 51 years old. Skin check. Lung cancer screening. You may have this screening every year starting at age 5 if you have a 30-pack-year history of smoking and currently smoke or have quit within the past 15 years. Fecal occult blood test (FOBT) of the stool. You may have this test every year starting at age 34. Flexible sigmoidoscopy or colonoscopy. You may have a sigmoidoscopy every 5 years or a colonoscopy every 10 years starting at age 73. Prostate cancer screening. Recommendations will vary depending on your family history and other risks. Hepatitis C blood test. Hepatitis B blood test. Sexually transmitted disease (STD) testing. Diabetes screening. This is done by checking your blood sugar (glucose) after you have not  eaten for a while (fasting). You may have this done every 1-3 years. Abdominal aortic aneurysm (AAA) screening. You may need this if you are a current or former smoker. Osteoporosis. You may be screened starting at age 19 if you are at high risk. Talk with your health care provider about your test results, treatment options, and if necessary, the need for more tests. Vaccines  Your health care provider may recommend certain vaccines,  such as: Influenza vaccine. This is recommended every year. Tetanus, diphtheria, and acellular pertussis (Tdap, Td) vaccine. You may need a Td booster every 10 years. Zoster vaccine. You may need this after age 70. Pneumococcal 13-valent conjugate (PCV13) vaccine. One dose is recommended after age 67. Pneumococcal polysaccharide (PPSV23) vaccine. One dose is recommended after age 72. Talk to your health care provider about which screenings and vaccines you need and how often you need them. This information is not intended to replace advice given to you by your health care provider. Make sure you discuss any questions you have with your health care provider. Document Released: 05/12/2015 Document Revised: 01/03/2016 Document Reviewed: 02/14/2015 Elsevier Interactive Patient Education  2017 [employer] Prevention in the Home Falls can cause injuries. They can happen to people of all ages. There are many things you can do to make your home safe and to help prevent falls. What can I do on the outside of my home? Regularly fix the edges of walkways and driveways and fix any cracks. Remove anything that might make you trip as you walk through a door, such as a raised step or threshold. Trim any bushes or trees on the path to your home. Use bright outdoor lighting. Clear any walking paths of anything that might make someone trip, such as rocks or tools. Regularly check to see if handrails are loose or broken. Make sure that both sides of any steps have handrails. Any raised decks and porches should have guardrails on the edges. Have any leaves, snow, or ice cleared regularly. Use sand or salt on walking paths during winter. Clean up any spills in your garage right away. This includes oil or grease spills. What can I do in the bathroom? Use night lights. Install grab bars by the toilet and in the tub and shower. Do not use towel bars as grab bars. Use non-skid mats or decals in the tub or  shower. If you need to sit down in the shower, use a plastic, non-slip stool. Keep the floor dry. Clean up any water that spills on the floor as soon as it happens. Remove soap buildup in the tub or shower regularly. Attach bath mats securely with double-sided non-slip rug tape. Do not have throw rugs and other things on the floor that can make you trip. What can I do in the bedroom? Use night lights. Make sure that you have a light by your bed that is easy to reach. Do not use any sheets or blankets that are too big for your bed. They should not hang down onto the floor. Have a firm chair that has side arms. You can use this for support while you get dressed. Do not have throw rugs and other things on the floor that can make you trip. What can I do in the kitchen? Clean up any spills right away. Avoid walking on wet floors. Keep items that you use a lot in easy-to-reach places. If you need to reach something above you, use a strong step stool that  has a grab bar. Keep electrical cords out of the way. Do not use floor polish or wax that makes floors slippery. If you must use wax, use non-skid floor wax. Do not have throw rugs and other things on the floor that can make you trip. What can I do with my stairs? Do not leave any items on the stairs. Make sure that there are handrails on both sides of the stairs and use them. Fix handrails that are broken or loose. Make sure that handrails are as long as the stairways. Check any carpeting to make sure that it is firmly attached to the stairs. Fix any carpet that is loose or worn. Avoid having throw rugs at the top or bottom of the stairs. If you do have throw rugs, attach them to the floor with carpet tape. Make sure that you have a light switch at the top of the stairs and the bottom of the stairs. If you do not have them, ask someone to add them for you. What else can I do to help prevent falls? Wear shoes that: Do not have high heels. Have  rubber bottoms. Are comfortable and fit you well. Are closed at the toe. Do not wear sandals. If you use a stepladder: Make sure that it is fully opened. Do not climb a closed stepladder. Make sure that both sides of the stepladder are locked into place. Ask someone to hold it for you, if possible. Clearly mark and make sure that you can see: Any grab bars or handrails. First and last steps. Where the edge of each step is. Use tools that help you move around (mobility aids) if they are needed. These include: Canes. Walkers. Scooters. Crutches. Turn on the lights when you go into a dark area. Replace any light bulbs as soon as they burn out. Set up your furniture so you have a clear path. Avoid moving your furniture around. If any of your floors are uneven, fix them. If there are any pets around you, be aware of where they are. Review your medicines with your doctor. Some medicines can make you feel dizzy. This can increase your chance of falling. Ask your doctor what other things that you can do to help prevent falls. This information is not intended to replace advice given to you by your health care provider. Make sure you discuss any questions you have with your health care provider. Document Released: 02/09/2009 Document Revised: 09/21/2015 Document Reviewed: 05/20/2014 Elsevier Interactive Patient Education  2017 [employer].

## 2022-05-06 NOTE — Progress Notes (Signed)
Subjective:   Richard Swanson is a 88 y.o. male who presents for Medicare Annual/Subsequent preventive examination.  Review of Systems     Cardiac Risk Factors include: advanced age (>19mn, >>70women);diabetes mellitus;hypertension;dyslipidemia;male gender     Objective:    Today's Vitals   05/06/22 1304  BP: 138/72  Pulse: 66  Temp: (!) 97.5 F (36.4 C)  SpO2: 96%  Weight: 184 lb 12.8 oz (83.8 kg)   Body mass index is 25.06 kg/m.     05/06/2022    1:09 PM 02/25/2022    3:36 PM 04/16/2021    1:20 PM 02/20/2021    2:46 PM 08/21/2020    3:08 PM 04/03/2020    1:33 PM 02/14/2020    2:28 PM  Advanced Directives  Does Patient Have a Medical Advance Directive? Yes No Yes No No No No  Type of AP[place]of [place]  [place] Chart? No - copy requested  No - copy requested      Would patient like information on creating a medical advance directive?  No - Patient declined  No - Patient declined No - Patient declined Yes (MAU/Ambulatory/Procedural Areas - Information given) No - Patient declined    Current Medications (verified) Outpatient Encounter Medications as of 05/06/2022  Medication Sig   amLODipine (NORVASC) 5 MG tablet Take 5 mg by mouth daily.   glipiZIDE (GLUCOTROL XL) 5 MG 24 hr tablet TAKE (2) TABLETS BY MOUTH DAILY BREAKFAST.   latanoprost (XALATAN) 0.005 % ophthalmic solution 1 drop at bedtime.   linagliptin (TRADJENTA) 5 MG TABS tablet Take 1 tablet (5 mg total) by mouth daily.   metoprolol succinate (TOPROL-XL) 25 MG 24 hr tablet TAKE 1 TABLET BY MOUTH EVERY DAY.   MYRBETRIQ 25 MG TB24 tablet TAKE ONE TABLET BY MOUTH ONCE DAILY.   rosuvastatin (CRESTOR) 5 MG tablet TAKE ONE TABLET BY MOUTH ONCE DAILY.   sitaGLIPtin (JANUVIA) 50 MG tablet TAKE ONE TABLET BY MOUTH ONCE DAILY.   traMADol (ULTRAM) 50 MG tablet TAKE (1/2) TABLET BY MOUTH EVERY 12 HOURS AS NEEDED.    valsartan (DIOVAN) 40 MG tablet Take 40 mg by mouth daily.   [DISCONTINUED] VYZULTA 0.024 % SOLN Apply 1 drop to eye at bedtime.   No facility-administered encounter medications on file as of 05/06/2022.    Allergies (verified) Patient has no known allergies.   History: Past Medical History:  Diagnosis Date   Cancer ([place]    Prostate   Chronic obstructive pulmonary disease, unspecified ([facility]    Diabetes mellitus without complication (HCC)    Dizziness    Gastro-esophageal reflux disease without esophagitis    Glaucoma    Glomerular disorders in diseases classified elsewhere    Gross hematuria    Hypertensive chronic kidney disease with stage 1 through stage 4 chronic kidney disease, or unspecified chronic kidney disease    Kidney disease, chronic, stage III (moderate, EGFR 30-59 ml/min) (HCC)    Malignant neoplasm of prostate ([facility]    Prostate cancer ([facility] 11/03/2010   dx 2006   Type 2 diabetes mellitus with diabetic nephropathy (HCC)    Unspecified kidney failure    Past Surgical History:  Procedure Laterality Date   PROSTATE SURGERY     Family History  Problem Relation Age of Onset   Stroke Father    Social History   Socioeconomic History   Marital status: Married  Spouse name: Not on file   Number of children: Not on file   Years of education: Not on file   Highest education level: Not on file  Occupational History   Occupation: Retired  Tobacco Use   Smoking status: Never   Smokeless tobacco: Never  Vaping Use   Vaping Use: Never used  Substance and Sexual Activity   Alcohol use: No   Drug use: No   Sexual activity: Not on file  Other Topics Concern   Not on file  Social History Narrative   Not on file   Social Determinants of Health   Financial Resource Strain: Low Risk  (04/16/2021)   Overall Financial Resource Strain (CARDIA)    Difficulty of Paying Living Expenses: Not hard at all  Food Insecurity: No Food Insecurity (04/16/2021)   Hunger Vital  Sign    Worried About Running Out of Food in the Last Year: Never true    [place] in the Last Year: Never true  Transportation Needs: No Transportation Needs (04/16/2021)   PRAPARE - Hydrologist (Medical): No    Lack of Transportation (Non-Medical): No  Physical Activity: Sufficiently Active (05/06/2022)   Exercise Vital Sign    Days of Exercise per Week: 3 days    Minutes of Exercise per Session: 60 min  Stress: No Stress Concern Present (04/16/2021)   [place]    Feeling of Stress : Not at all  Social Connections: [place] (04/16/2021)   Social Connection and Isolation Panel [NHANES]    Frequency of Communication with Friends and Family: Once a week    Frequency of Social Gatherings with Friends and Family: Three times a week    Attends Religious Services: More than 4 times per year    Active Member of Clubs or Organizations: Yes    Attends Archivist Meetings: 1 to 4 times per year    Marital Status: Married    Tobacco Counseling Counseling given: Not Answered   Clinical Intake:  Pre-visit preparation completed: Yes  Pain : No/denies pain     BMI - recorded: 25.06 Nutritional Status: BMI 25 -29 Overweight Nutritional Risks: None Diabetes: Yes CBG done?: Yes (142 per pt) CBG resulted in Enter/ Edit results?: No Did pt. bring in CBG monitor from home?: No  How often do you need to have someone help you when you read instructions, pamphlets, or other written materials from your doctor or pharmacy?: 1 - Never  Diabetic?Nutrition Risk Assessment:  Has the patient had any N/V/D within the last 2 months?  No  Does the patient have any non-healing wounds?  No  Has the patient had any unintentional weight loss or weight gain?  No   Diabetes:  Is the patient diabetic?  Yes  If diabetic, was a CBG obtained today?  Yes  Did the patient bring in  their glucometer from home?  No  How often do you monitor your CBG's? daily.   Financial Strains and Diabetes Management:  Are you having any financial strains with the device, your supplies or your medication? No .  Does the patient want to be seen by Chronic Care Management for management of their diabetes?  No  Would the patient like to be referred to a Nutritionist or for Diabetic Management?  No   Diabetic Exams:  Diabetic Eye Exam: Overdue for diabetic eye exam. Pt has been advised about the importance  in completing this exam. Patient advised to call and schedule an eye exam. Diabetic Foot Exam: Completed 05/03/21  Interpreter Needed?: No  Information entered by :: Charlott Rakes, LPN   Activities of Daily Living    05/02/2022    7:16 PM  In your present state of health, do you have any difficulty performing the following activities:  Hearing? 0  Vision? 0  Difficulty concentrating or making decisions? 0  Walking or climbing stairs? 0  Dressing or bathing? 0  Doing errands, shopping? 0  Preparing Food and eating ? N  Using the Toilet? N  In the past six months, have you accidently leaked urine? Y  Comment wears a brief  Do you have problems with loss of bowel control? N  Managing your Medications? N  Managing your Finances? N  Housekeeping or managing your Housekeeping? N    Patient Care Team: Vivi Barrack, MD as PCP - General (Family Medicine) Herminio Commons, MD (Inactive) as PCP - Cardiology (Cardiology) Liana Gerold, MD as Consulting Physician (Nephrology)  Indicate any recent Medical Services you may have received from other than [facility] providers in the past year (date may be approximate).     Assessment:   This is a routine wellness examination for [place].  Hearing/Vision screen Hearing Screening - Comments:: Pt stated he hears what he wants to hear tinnitus continues in left ear Vision Screening - Comments:: Pt follows up with [facility] for  annual eye exams   Dietary issues and exercise activities discussed: Current Exercise Habits: Home exercise routine, Type of exercise: Other - see comments, Time (Minutes): 60, Frequency (Times/Week): 3, Weekly Exercise (Minutes/Week): 180   Goals Addressed             This Visit's Progress    Patient Stated       Keep sugar down        Depression Screen    05/06/2022    1:11 PM 11/08/2021    1:03 PM 04/16/2021    1:19 PM 04/03/2020    1:30 PM 11/16/2019    1:17 PM 08/19/2019   10:37 AM 05/31/2019    1:58 PM  PHQ 2/9 Scores  PHQ - 2 Score 0 0 0 0 0 0 0    Fall Risk    05/02/2022    7:16 PM 11/08/2021    1:03 PM 04/16/2021    1:22 PM 04/03/2020    1:34 PM 11/30/2019   10:29 AM  Heath in the past year? 0 0 0 0 0  Number falls in past yr: 0 0 0 0 0  Injury with Fall? 0 0 0 0 0  Risk for fall due to : Impaired vision  Impaired vision Impaired vision   Follow up Falls prevention discussed  Falls prevention discussed Falls prevention discussed Falls prevention discussed    FALL RISK PREVENTION PERTAINING TO THE HOME:  Any stairs in or around the home? No  If so, are there any without handrails? No  Home free of loose throw rugs in walkways, pet beds, electrical cords, etc? Yes  Adequate lighting in your home to reduce risk of falls? Yes   ASSISTIVE DEVICES UTILIZED TO PREVENT FALLS:  Life alert? No  Use of a cane, walker or w/c? No  Grab bars in the bathroom? Yes  Shower chair or bench in shower? No  Elevated toilet seat or a handicapped toilet? No   TIMED UP AND GO:  Was the  test performed? Yes .  Length of time to ambulate 10 feet: 10 sec.   Gait steady and fast without use of assistive device  Cognitive Function:        05/06/2022    1:12 PM 04/16/2021    1:23 PM 04/03/2020    1:37 PM  6CIT Screen  What Year? 0 points 0 points 0 points  What month? 0 points 0 points 0 points  What time? 0 points 0 points   Count back from 20 0 points 0 points 0  points  Months in reverse 0 points 0 points 0 points  Repeat phrase 0 points 0 points 0 points  Total Score 0 points 0 points     Immunizations Immunization History  Administered Date(s) Administered   Fluad Quad(high Dose 65+) 02/14/2020, 02/20/2021, 02/25/2022   Influenza, High Dose Seasonal PF 03/23/2018   Influenza,inj,Quad PF,6+ Mos 01/30/2016, 01/29/2017, 01/28/2019   Influenza-Unspecified 12/28/2020   Moderna SARS-COV2 Booster Vaccination 03/16/2020   Moderna Sars-Covid-2 Vaccination 06/05/2019, 07/06/2019   Pneumococcal Conjugate-13 04/06/2020   Pneumococcal Polysaccharide-23 01/28/2019    TDAP status: Due, Education has been provided regarding the importance of this vaccine. Advised may receive this vaccine at local pharmacy or Health Dept. Aware to provide a copy of the vaccination record if obtained from local pharmacy or Health Dept. Verbalized acceptance and understanding.  Flu Vaccine status: Up to date  Pneumococcal vaccine status: Up to date  Covid-19 vaccine status: Completed vaccines  Qualifies for Shingles Vaccine? Yes   Zostavax completed No   Shingrix Completed?: No.    Education has been provided regarding the importance of this vaccine. Patient has been advised to call insurance company to determine out of pocket expense if they have not yet received this vaccine. Advised may also receive vaccine at local pharmacy or Health Dept. Verbalized acceptance and understanding.  Screening Tests Health Maintenance  Topic Date Due   Diabetic kidney evaluation - Urine ACR  Never done   DTaP/Tdap/Td (1 - Tdap) Never done   Zoster Vaccines- Shingrix (1 of 2) Never done   OPHTHALMOLOGY EXAM  02/20/2022   FOOT EXAM  05/03/2022   HEMOGLOBIN A1C  07/16/2022   Diabetic kidney evaluation - eGFR measurement  02/19/2023   Medicare Annual Wellness (AWV)  05/07/2023   Pneumonia Vaccine 79+ Years old  Completed   INFLUENZA VACCINE  Completed   HPV VACCINES  Aged Out    COVID-19 Vaccine  Discontinued    Health Maintenance  Health Maintenance Due  Topic Date Due   Diabetic kidney evaluation - Urine ACR  Never done   DTaP/Tdap/Td (1 - Tdap) Never done   Zoster Vaccines- Shingrix (1 of 2) Never done   OPHTHALMOLOGY EXAM  02/20/2022   FOOT EXAM  05/03/2022    Colorectal cancer screening: No longer required.    Additional Screening:  Vision Screening: Recommended annual ophthalmology exams for early detection of glaucoma and other disorders of the eye. Is the patient up to date with their annual eye exam?  No  Who is the provider or what is the name of the office in which the patient attends annual eye exams? [facility]  If pt is not established with a provider, would they like to be referred to a provider to establish care? No .   Dental Screening: Recommended annual dental exams for proper oral hygiene  Community Resource Referral / Chronic Care Management: CRR required this visit?  No   CCM required this visit?  No  Plan:     I have personally reviewed and noted the following in the patient's chart:   Medical and social history Use of alcohol, tobacco or illicit drugs  Current medications and supplements including opioid prescriptions. Patient is currently taking opioid prescriptions. Information provided to patient regarding non-opioid alternatives. Patient advised to discuss non-opioid treatment plan with their provider. Functional ability and status Nutritional status Physical activity Advanced directives List of other physicians Hospitalizations, surgeries, and ER visits in previous 12 months Vitals Screenings to include cognitive, depression, and falls Referrals and appointments  In addition, I have reviewed and discussed with patient certain preventive protocols, quality metrics, and best practice recommendations. A written personalized care plan for preventive services as well as general preventive health recommendations  were provided to patient.     Willette Brace, LPN   06/07/8335   Nurse Notes: none

## 2022-05-07 ENCOUNTER — Other Ambulatory Visit: Payer: Self-pay | Admitting: Family Medicine

## 2022-05-07 NOTE — Telephone Encounter (Signed)
    important  Maximum MME cannot be calculated for this prescription. Enter discrete sig details to calculate maximum MME.

## 2022-05-21 ENCOUNTER — Encounter: Payer: Self-pay | Admitting: Nurse Practitioner

## 2022-05-21 ENCOUNTER — Ambulatory Visit: Payer: Medicare Other | Admitting: Nurse Practitioner

## 2022-05-21 VITALS — BP 150/80 | HR 54 | Ht 72.0 in | Wt 184.2 lb

## 2022-05-21 DIAGNOSIS — I152 Hypertension secondary to endocrine disorders: Secondary | ICD-10-CM | POA: Diagnosis not present

## 2022-05-21 DIAGNOSIS — E782 Mixed hyperlipidemia: Secondary | ICD-10-CM

## 2022-05-21 DIAGNOSIS — E1159 Type 2 diabetes mellitus with other circulatory complications: Secondary | ICD-10-CM | POA: Diagnosis not present

## 2022-05-21 DIAGNOSIS — E1121 Type 2 diabetes mellitus with diabetic nephropathy: Secondary | ICD-10-CM | POA: Diagnosis not present

## 2022-05-21 LAB — POCT GLYCOSYLATED HEMOGLOBIN (HGB A1C): Hemoglobin A1C: 8.1 % — AB (ref 4.0–5.6)

## 2022-05-21 NOTE — Progress Notes (Signed)
05/21/2022, 11:09 AM   Endocrinology follow-up note  Subjective:    Patient ID: Richard Swanson, male    DOB: 04-19-1935.  Richard Swanson is being seen in follow-up after he was seen in consultation for management of currently uncontrolled symptomatic diabetes requested by  Richard Barrack, MD.   Past Medical History:  Diagnosis Date   Cancer [facility])    Prostate   Chronic obstructive pulmonary disease, unspecified ([place])    Diabetes mellitus without complication ([place])    Dizziness    Gastro-esophageal reflux disease without esophagitis    Glaucoma    Glomerular disorders in diseases classified elsewhere    Gross hematuria    Hypertensive chronic kidney disease with stage 1 through stage 4 chronic kidney disease, or unspecified chronic kidney disease    Kidney disease, chronic, stage III (moderate, EGFR 30-59 ml/min) ([place])    Malignant neoplasm of prostate ([place])    Prostate cancer ([place]) 11/03/2010   dx 2006   Type 2 diabetes mellitus with diabetic nephropathy ([place])    Unspecified kidney failure     Past Surgical History:  Procedure Laterality Date   PROSTATE SURGERY      Social History   Socioeconomic History   Marital status: Married    Spouse name: Not on file   Number of children: Not on file   Years of education: Not on file   Highest education level: Not on file  Occupational History   Occupation: Retired  Tobacco Use   Smoking status: Never   Smokeless tobacco: Never  Vaping Use   Vaping Use: Never used  Substance and Sexual Activity   Alcohol use: No   Drug use: No   Sexual activity: Not on file  Other Topics Concern   Not on file  Social History Narrative   Not on file   Social Determinants of Health   Financial Resource Strain: Low Risk  (04/16/2021)   Overall Financial Resource Strain (CARDIA)    Difficulty of Paying Living Expenses: Not hard at all  Food Insecurity: No  Food Insecurity (04/16/2021)   Hunger Vital Sign    Worried About Running Out of Food in the Last Year: Never true    Ran Out of Food in the Last Year: Never true  Transportation Needs: No Transportation Needs (04/16/2021)   PRAPARE - Hydrologist (Medical): No    Lack of Transportation (Non-Medical): No  Physical Activity: Sufficiently Active (05/06/2022)   Exercise Vital Sign    Days of Exercise per Week: 3 days    Minutes of Exercise per Session: 60 min  Stress: No Stress Concern Present (04/16/2021)   Friendship    Feeling of Stress : Not at all  Social Connections: [place] (04/16/2021)   Social Connection and Isolation Panel [NHANES]    Frequency of Communication with Friends and Family: Once a week    Frequency of Social Gatherings with Friends and Family: Three times a week    Attends Religious Services: More than 4 times per year    Active Member of Clubs or Organizations: Yes    Attends  Club or Organization Meetings: 1 to 4 times per year    Marital Status: Married    Family History  Problem Relation Age of Onset   Stroke Father     Outpatient Encounter Medications as of 05/21/2022  Medication Sig   amLODipine (NORVASC) 5 MG tablet Take 5 mg by mouth daily.   glipiZIDE (GLUCOTROL XL) 5 MG 24 hr tablet TAKE (2) TABLETS BY MOUTH DAILY BREAKFAST.   latanoprost (XALATAN) 0.005 % ophthalmic solution 1 drop at bedtime.   linagliptin (TRADJENTA) 5 MG TABS tablet Take 1 tablet (5 mg total) by mouth daily.   metoprolol succinate (TOPROL-XL) 25 MG 24 hr tablet TAKE 1 TABLET BY MOUTH EVERY DAY.   MYRBETRIQ 25 MG TB24 tablet TAKE ONE TABLET BY MOUTH ONCE DAILY.   rosuvastatin (CRESTOR) 5 MG tablet TAKE ONE TABLET BY MOUTH ONCE DAILY.   sitaGLIPtin (JANUVIA) 50 MG tablet TAKE ONE TABLET BY MOUTH ONCE DAILY.   traMADol (ULTRAM) 50 MG tablet TAKE (1/2) TABLET BY MOUTH EVERY 12  HOURS AS NEEDED.   valsartan (DIOVAN) 40 MG tablet Take 40 mg by mouth daily.   No facility-administered encounter medications on file as of 05/21/2022.    ALLERGIES: No Known Allergies  VACCINATION STATUS: Immunization History  Administered Date(s) Administered   Fluad Quad(high Dose 65+) 02/14/2020, 02/20/2021, 02/25/2022   Influenza, High Dose Seasonal PF 03/23/2018   Influenza,inj,Quad PF,6+ Mos 01/30/2016, 01/29/2017, 01/28/2019   Influenza-Unspecified 12/28/2020   Moderna SARS-COV2 Booster Vaccination 03/16/2020   Moderna Sars-Covid-2 Vaccination 06/05/2019, 07/06/2019   Pneumococcal Conjugate-13 04/06/2020   Pneumococcal Polysaccharide-23 01/28/2019    Diabetes He presents for his follow-up diabetic visit. He has type 2 diabetes mellitus. Onset time: He was diagnosed at approximate age of 88 years. His disease course has been fluctuating. There are no hypoglycemic associated symptoms. Pertinent negatives for hypoglycemia include no confusion, headaches, pallor or seizures. There are no diabetic associated symptoms. Pertinent negatives for diabetes include no chest pain, no fatigue, no polydipsia, no polyphagia, no polyuria and no weakness. There are no hypoglycemic complications. Symptoms are stable. Diabetic complications include nephropathy, peripheral neuropathy and PVD. Risk factors for coronary artery disease include dyslipidemia, diabetes mellitus, hypertension, male sex and sedentary lifestyle. Current diabetic treatment includes oral agent (dual therapy). He is compliant with treatment most of the time. His weight is fluctuating minimally. He is following a generally healthy diet. When asked about meal planning, he reported none. He has not had a previous visit with a dietitian. He rarely participates in exercise. His home blood glucose trend is fluctuating minimally. His breakfast blood glucose range is generally 130-140 mg/dl. (He presents today with his logs, and meter showing  stable at goal glycemic profile overall.  His POCT A1c today is 8.1%, essentially unchanged from previous visit.  Analysis of his meter shows 7-day average of 157, 14-day average of 147, 30-day average of 156.  He denies any hypoglycemia.  He does have chronic anemia related to his CKD which will skew his A1c results.) An ACE inhibitor/angiotensin II receptor blocker is being taken. He does not see a podiatrist.Eye exam is current.  Hyperlipidemia This is a chronic problem. The current episode started more than 1 year ago. The problem is controlled. Recent lipid tests were reviewed and are normal. Exacerbating diseases include chronic renal disease and diabetes. Factors aggravating his hyperlipidemia include beta blockers. Pertinent negatives include no chest pain, myalgias or shortness of breath. Current antihyperlipidemic treatment includes statins. The current treatment provides mild improvement  of lipids. There are no compliance problems.  Risk factors for coronary artery disease include dyslipidemia, diabetes mellitus, hypertension, male sex and a sedentary lifestyle.  Hypertension This is a chronic problem. The current episode started more than 1 year ago. The problem has been gradually improving since onset. The problem is controlled. Pertinent negatives include no chest pain, headaches, neck pain, palpitations or shortness of breath. There are no associated agents to hypertension. Risk factors for coronary artery disease include diabetes mellitus, dyslipidemia, male gender and sedentary lifestyle. Past treatments include ACE inhibitors, beta blockers and calcium channel blockers. The current treatment provides significant improvement. There are no compliance problems.  Hypertensive end-organ damage includes kidney disease and PVD. Identifiable causes of hypertension include chronic renal disease.     Review of systems  Constitutional: + stable body weight,  current Body mass index is 24.98 kg/m. ,  no fatigue, no subjective hyperthermia, no subjective hypothermia Eyes: no blurry vision, no xerophthalmia ENT: no sore throat, no nodules palpated in throat, no dysphagia/odynophagia, no hoarseness Cardiovascular: no chest pain, no shortness of breath, no palpitations, right leg swelling Respiratory: no cough, no shortness of breath Gastrointestinal: no nausea/vomiting/diarrhea Musculoskeletal: no muscle/joint aches Skin: no rashes, no hyperemia Neurological: no tremors, no numbness, no tingling, no dizziness Psychiatric: no depression, no anxiety   Objective:    BP (!) 150/80 Comment: Retaken with manuel BP cuff - patient has not taken his BP medication  Pulse (!) 54   Ht 6' (1.829 m)   Wt 184 lb 3.2 oz (83.6 kg)   BMI 24.98 kg/m   Wt Readings from Last 3 Encounters:  05/21/22 184 lb 3.2 oz (83.6 kg)  05/06/22 184 lb 12.8 oz (83.8 kg)  02/25/22 183 lb (83 kg)    BP Readings from Last 3 Encounters:  05/21/22 (!) 150/80  05/06/22 138/72  02/25/22 138/65     Physical Exam- Limited  Constitutional:  Body mass index is 24.98 kg/m. , not in acute distress, normal state of mind Eyes:  EOMI, no exophthalmos Musculoskeletal: no gross deformities, strength intact in all four extremities, no gross restriction of joint movements Skin:  no rashes, no hyperemia Neurological: no tremor with outstretched hands   Diabetic Foot Exam - Simple   Simple Foot Form Diabetic Foot exam was performed with the following findings: Yes 05/21/2022 10:46 AM  Visual Inspection See comments: Yes Sensation Testing Intact to touch and monofilament testing bilaterally: Yes Pulse Check Posterior Tibialis and Dorsalis pulse intact bilaterally: Yes Comments Onychomycosis to great toe bilaterally, dry flaky skin bilaterally     CMP     Component Value Date/Time   NA 136 02/18/2022 1327   NA 140 05/14/2019 0000   K 4.0 02/18/2022 1327   CL 102 02/18/2022 1327   CO2 27 02/18/2022 1327    GLUCOSE 194 (H) 02/18/2022 1327   BUN 34 (H) 02/18/2022 1327   BUN 26 (A) 05/14/2019 0000   CREATININE 2.37 (H) 02/18/2022 1327   CREATININE 2.23 (H) 04/06/2020 1206   CALCIUM 8.8 (L) 02/18/2022 1327   PROT 7.4 02/18/2022 1327   ALBUMIN 3.9 02/18/2022 1327   AST 21 02/18/2022 1327   ALT 20 02/18/2022 1327   ALKPHOS 51 02/18/2022 1327   BILITOT 0.7 02/18/2022 1327   GFRNONAA 26 (L) 02/18/2022 1327   GFRAA 29 (L) 07/29/2019 1115    Diabetic Labs (most recent): Lab Results  Component Value Date   HGBA1C 8.1 (A) 05/21/2022   HGBA1C 8.2 (A) 01/15/2022  HGBA1C 7.8 09/12/2021     Lipid Panel ( most recent) Lipid Panel     Component Value Date/Time   CHOL 130 05/03/2021 1353   TRIG 96.0 05/03/2021 1353   HDL 46.30 05/03/2021 1353   CHOLHDL 3 05/03/2021 1353   VLDL 19.2 05/03/2021 1353   LDLCALC 64 05/03/2021 1353   LDLCALC 59 04/06/2020 1206      Lab Results  Component Value Date   TSH 1.09 05/03/2021   TSH 0.49 04/06/2020   TSH 1.48 05/05/2017      Assessment & Plan:   1) Type 2 diabetes mellitus with diabetic nephropathy, without long-term current use of insulin ([place])  - Chares Slaymaker has currently uncontrolled symptomatic type 2 DM since 88 years of age.  He presents today with his logs, and meter showing stable at goal glycemic profile overall.  His POCT A1c today is 8.1%, essentially unchanged from previous visit.  Analysis of his meter shows 7-day average of 157, 14-day average of 147, 30-day average of 156.  He denies any hypoglycemia.  He does have chronic anemia related to his CKD which will skew his A1c results.  - Recent labs reviewed.  - I had a long discussion with him about the progressive nature of diabetes and the pathology behind its complications. -his diabetes is complicated by CKD and he remains at a high risk for more acute and chronic complications which include CAD, CVA, CKD, retinopathy, and neuropathy. These are all discussed in detail  with him.  - Nutritional counseling repeated at each appointment due to patients tendency to fall back in to old habits.  - The patient admits there is a room for improvement in their diet and drink choices. -  Suggestion is made for the patient to avoid simple carbohydrates from their diet including Cakes, Sweet Desserts / Pastries, Ice Cream, Soda (diet and regular), Sweet Tea, Candies, Chips, Cookies, Sweet Pastries, Store Bought Juices, Alcohol in Excess of 1-2 drinks a day, Artificial Sweeteners, Coffee Creamer, and "Sugar-free" Products. This will help patient to have stable blood glucose profile and potentially avoid unintended weight gain.   - I encouraged the patient to switch to unprocessed or minimally processed complex starch and increased protein intake (animal or plant source), fruits, and vegetables.   - Patient is advised to stick to a routine mealtimes to eat 3 meals a day and avoid unnecessary snacks (to snack only to correct hypoglycemia).  - he sees Jearld Fenton, RDE routinely for diabetes education and meal planning.  - I have approached him with the following individualized plan to manage  his diabetes and patient agrees:   - He is advised to continue Januvia 50 mg po daily and Glipizide 10 mg XL daily with breakfast.  His chronic anemia is likely skewing his A1c results as they do not match up with his at goal glucose readings.  -He is encouraged to continue monitoring blood glucose once daily, before breakfast and to call the clinic if he has readings less than 70 or greater than 200 for 3 tests in a row.  - he is not a candidate for metformin, SGLT2 inhibitors, or full dose Januvia due to concurrent renal insufficiency.  - Specific targets for  A1c;  LDL, HDL,  and Triglycerides were discussed with the patient.  2) Blood Pressure /Hypertension:  His blood pressure is controlled to target for his age. He is advised to continue Metoprolol 25 mg p.o. daily and  Amlodipine 2.5 mg p.o. daily.  3) Lipids/Hyperlipidemia:   His most recent lipid panel from 05/03/21 shows controlled LDL of 64.  He is advised to continue Crestor 5 mg po daily at bedtime.  Side effects and precautions discussed with him.   4)  Weight/Diet: His Body mass index is 24.98 kg/m.  -  he is not a candidate for weight loss.  Exercise, and detailed carbohydrates information provided  -  detailed on discharge instructions.  5) Chronic Care/Health Maintenance: -he  is on ACEI/ARB and Statin medications and is encouraged to initiate and continue to follow up with Ophthalmology, Dentist, Nephrologist, and Podiatrist at least yearly or according to recommendations, and advised to stay away from smoking. I have recommended yearly flu vaccine and pneumonia vaccine at least every 5 years; moderate intensity exercise for up to 150 minutes weekly; and  sleep for at least 7 hours a day.  - he is  advised to maintain close follow up with Richard Barrack, MD for primary care needs, as well as his other providers for optimal and coordinated care.    He is concerned about his right leg swelling.  He has used compression stockings in the past with some relief.  He is advised to mention his leg swelling to his nephrologist to see if anything can be given to help (may not be candidate for diuretic due to CKD stage 4).      I spent 41 minutes in the care of the patient today including review of labs from [facility], Lipids, Thyroid Function, Hematology (current and previous including abstractions from other facilities); face-to-face time discussing  his blood glucose readings/logs, discussing hypoglycemia and hyperglycemia episodes and symptoms, medications doses, his options of short and long term treatment based on the latest standards of care / guidelines;  discussion about incorporating lifestyle medicine;  and documenting the encounter. Risk reduction counseling performed per USPSTF guidelines to reduce  obesity and cardiovascular risk factors.     Please refer to Patient Instructions for Blood Glucose Monitoring and Insulin/Medications Dosing Guide"  in media tab for additional information. Please  also refer to " Patient Self Inventory" in the Media  tab for reviewed elements of pertinent patient history.  Richard Swanson participated in the discussions, expressed understanding, and voiced agreement with the above plans.  All questions were answered to his satisfaction. he is encouraged to contact clinic should he have any questions or concerns prior to his return visit.   Follow up plan: - Return in about 4 months (around 09/19/2022) for Diabetes F/U with A1c in office, No previsit labs, Bring meter and logs.  Rayetta Pigg, [facility] [address] [place], [place] [address] Phone: [phone] Fax: [phone]   05/21/2022, 11:09 AM

## 2022-05-27 NOTE — Progress Notes (Signed)
H&P  Chief Complaint: Urinary frequency, urgency, urgency incontinence  History of Present Illness: 88 year old male, retired Theme [employer] manager, native of [place] comes in the day for evaluation and management of lower urinary tract symptoms as well as a history of prostate cancer.  He had a radical prostatectomy in 1996 here in [place].  He has had no evidence of recurrence.  He has had no significant issues with stress incontinence, but has recently developed frequency, urgency, urgency incontinence and nocturia x 3.  He has a good stream, feels like he empties well.  He has very rare initial hematuria.  He is a non-smoker.  He does drink a significant amount of sodas.  Past Medical History:  Diagnosis Date   Cancer [facility])    Prostate   Chronic obstructive pulmonary disease, unspecified ([place])    Diabetes mellitus without complication ([facility])    Dizziness    Gastro-esophageal reflux disease without esophagitis    Glaucoma    Glomerular disorders in diseases classified elsewhere    Gross hematuria    Hypertensive chronic kidney disease with stage 1 through stage 4 chronic kidney disease, or unspecified chronic kidney disease    Kidney disease, chronic, stage III (moderate, EGFR 30-59 ml/min) ([facility])    Malignant neoplasm of prostate ([place])    Prostate cancer ([place]) 11/03/2010   dx 2006   Type 2 diabetes mellitus with diabetic nephropathy ([facility])    Unspecified kidney failure     Past Surgical History:  Procedure Laterality Date   PROSTATE SURGERY      Home Medications:  Allergies as of 05/28/2022   No Known Allergies      Medication List        Accurate as of May 27, 2022  7:18 PM. If you have any questions, ask your nurse or doctor.          amLODipine 5 MG tablet Commonly known as: NORVASC Take 5 mg by mouth daily.   glipiZIDE 5 MG 24 hr tablet Commonly known as: GLUCOTROL XL TAKE (2) TABLETS BY MOUTH DAILY BREAKFAST.   Januvia 50 MG tablet Generic drug:  sitaGLIPtin TAKE ONE TABLET BY MOUTH ONCE DAILY.   latanoprost 0.005 % ophthalmic solution Commonly known as: XALATAN 1 drop at bedtime.   linagliptin 5 MG Tabs tablet Commonly known as: Tradjenta Take 1 tablet (5 mg total) by mouth daily.   metoprolol succinate 25 MG 24 hr tablet Commonly known as: TOPROL-XL TAKE 1 TABLET BY MOUTH EVERY DAY.   Myrbetriq 25 MG Tb24 tablet Generic drug: mirabegron ER TAKE ONE TABLET BY MOUTH ONCE DAILY.   rosuvastatin 5 MG tablet Commonly known as: CRESTOR TAKE ONE TABLET BY MOUTH ONCE DAILY.   traMADol 50 MG tablet Commonly known as: ULTRAM TAKE (1/2) TABLET BY MOUTH EVERY 12 HOURS AS NEEDED.   valsartan 40 MG tablet Commonly known as: DIOVAN Take 40 mg by mouth daily.        Allergies: No Known Allergies  Family History  Problem Relation Age of Onset   Stroke Father     Social History:  reports that he has never smoked. He has never used smokeless tobacco. He reports that he does not drink alcohol and does not use drugs.  ROS: A complete review of systems was performed.  All systems are negative except for pertinent findings as noted.  Physical Exam:  Vital signs in last 24 hours: There were no vitals taken for this visit. Constitutional:  Alert and oriented, No acute distress Cardiovascular:  Regular rate  Respiratory: Normal respiratory effort Genitourinary: Normal male phallus, testes are descended bilaterally and non-tender and without masses, scrotum is normal in appearance without lesions or masses, perineum is normal on inspection. Lymphatic: No lymphadenopathy Neurologic: Grossly intact, no focal deficits Psychiatric: Normal mood and affect  I have reviewed prior pt notes  I have reviewed notes from referring/previous physicians  I have reviewed urinalysis results  I have independently reviewed prior imaging--prior CT results  I have reviewed prior PSA results--undetectable  I have reviewed prior urine  culture--2021 E coli  Bladder scan volume today 0   Impression/Assessment:  1.  History of lower urinary tract symptoms, still significant despite being on Myrbetriq  2.  History of prostate cancer, status post radical prostatectomy 28 years ago without evidence of recurrence.  Most recent PSA last year was undetectable  Plan:  1.  Overactive bladder guidance sheet given to the patient  2.  Would recommend limiting sodas  3.  It is okay to stop the Myrbetriq.  We will try Solifenacin instead  4.  I will see back in about 2 to 3 months to recheck symptoms

## 2022-05-28 ENCOUNTER — Ambulatory Visit: Payer: Medicare Other | Admitting: Urology

## 2022-05-28 VITALS — BP 182/79 | HR 65

## 2022-05-28 DIAGNOSIS — Z8546 Personal history of malignant neoplasm of prostate: Secondary | ICD-10-CM

## 2022-05-28 DIAGNOSIS — R3915 Urgency of urination: Secondary | ICD-10-CM | POA: Diagnosis not present

## 2022-05-28 DIAGNOSIS — I739 Peripheral vascular disease, unspecified: Secondary | ICD-10-CM

## 2022-05-28 DIAGNOSIS — R350 Frequency of micturition: Secondary | ICD-10-CM | POA: Diagnosis not present

## 2022-05-28 DIAGNOSIS — N183 Chronic kidney disease, stage 3 unspecified: Secondary | ICD-10-CM

## 2022-05-28 DIAGNOSIS — C61 Malignant neoplasm of prostate: Secondary | ICD-10-CM

## 2022-05-28 MED ORDER — SOLIFENACIN SUCCINATE 10 MG PO TABS: 10.0000 mg | ORAL_TABLET | Freq: Every day | ORAL | 11 refills | Status: DC

## 2022-05-28 NOTE — Addendum Note (Signed)
Addended by: Franchot Gallo on: 05/28/2022 03:28 PM   Modules accepted: Orders

## 2022-05-29 LAB — MICROSCOPIC EXAMINATION
RBC, Urine: >30 /hpf — AB (ref 0–2)
WBC, UA: >30 /hpf — AB (ref 0–5)

## 2022-05-29 LAB — URINALYSIS, ROUTINE W REFLEX MICROSCOPIC
Bilirubin, UA: NEGATIVE
Ketones, UA: NEGATIVE
Nitrite, UA: NEGATIVE
Specific Gravity, UA: 1.025 (ref 1.005–1.030)
Urobilinogen, Ur: 0.2 mg/dL (ref 0.2–1.0)
pH, UA: 5.5 (ref 5.0–7.5)

## 2022-06-02 LAB — URINE CULTURE

## 2022-06-03 ENCOUNTER — Ambulatory Visit: Payer: Medicare Other | Admitting: Urology

## 2022-06-04 ENCOUNTER — Telehealth: Payer: Self-pay

## 2022-06-04 NOTE — Telephone Encounter (Signed)
Tried calling patient with no answer. Will F/U

## 2022-06-04 NOTE — Telephone Encounter (Signed)
-----   Message from Franchot Gallo, MD sent at 06/04/2022 12:32 PM EST ----- Let patient know that a common bacteria was found in the urine.  Unless significant burning or blood in urine, he does not need to be on antibiotic. ----- Message ----- From: Sherrilyn Rist, CMA Sent: 06/03/2022  11:48 AM EST To: Franchot Gallo, MD  Please review

## 2022-06-06 NOTE — Telephone Encounter (Signed)
Patient is aware that he has a common bacteria found in the urine and no antibiotic is needed at this time, unless he is experiencing significant burning or blood in the urine. Patient voiced understanding

## 2022-06-14 ENCOUNTER — Other Ambulatory Visit: Payer: Self-pay | Admitting: Family Medicine

## 2022-06-14 DIAGNOSIS — I10 Essential (primary) hypertension: Secondary | ICD-10-CM

## 2022-06-17 DIAGNOSIS — H401131 Primary open-angle glaucoma, bilateral, mild stage: Secondary | ICD-10-CM | POA: Diagnosis not present

## 2022-06-25 ENCOUNTER — Other Ambulatory Visit: Payer: Self-pay | Admitting: Family Medicine

## 2022-07-12 ENCOUNTER — Other Ambulatory Visit: Payer: Self-pay | Admitting: Family Medicine

## 2022-07-15 ENCOUNTER — Other Ambulatory Visit: Payer: Self-pay | Admitting: Nurse Practitioner

## 2022-07-16 ENCOUNTER — Ambulatory Visit: Payer: Medicare Other | Admitting: Urology

## 2022-07-29 DIAGNOSIS — R809 Proteinuria, unspecified: Secondary | ICD-10-CM | POA: Diagnosis not present

## 2022-07-29 DIAGNOSIS — E1122 Type 2 diabetes mellitus with diabetic chronic kidney disease: Secondary | ICD-10-CM | POA: Diagnosis not present

## 2022-07-29 DIAGNOSIS — N189 Chronic kidney disease, unspecified: Secondary | ICD-10-CM | POA: Diagnosis not present

## 2022-07-29 DIAGNOSIS — D631 Anemia in chronic kidney disease: Secondary | ICD-10-CM | POA: Diagnosis not present

## 2022-07-29 DIAGNOSIS — D696 Thrombocytopenia, unspecified: Secondary | ICD-10-CM | POA: Diagnosis not present

## 2022-08-01 DIAGNOSIS — D631 Anemia in chronic kidney disease: Secondary | ICD-10-CM | POA: Diagnosis not present

## 2022-08-01 DIAGNOSIS — E1129 Type 2 diabetes mellitus with other diabetic kidney complication: Secondary | ICD-10-CM | POA: Diagnosis not present

## 2022-08-01 DIAGNOSIS — R809 Proteinuria, unspecified: Secondary | ICD-10-CM | POA: Diagnosis not present

## 2022-08-01 DIAGNOSIS — D696 Thrombocytopenia, unspecified: Secondary | ICD-10-CM | POA: Diagnosis not present

## 2022-08-01 DIAGNOSIS — I129 Hypertensive chronic kidney disease with stage 1 through stage 4 chronic kidney disease, or unspecified chronic kidney disease: Secondary | ICD-10-CM | POA: Diagnosis not present

## 2022-08-01 DIAGNOSIS — N189 Chronic kidney disease, unspecified: Secondary | ICD-10-CM | POA: Diagnosis not present

## 2022-08-01 DIAGNOSIS — E1122 Type 2 diabetes mellitus with diabetic chronic kidney disease: Secondary | ICD-10-CM | POA: Diagnosis not present

## 2022-08-05 DIAGNOSIS — H401131 Primary open-angle glaucoma, bilateral, mild stage: Secondary | ICD-10-CM | POA: Diagnosis not present

## 2022-08-06 ENCOUNTER — Other Ambulatory Visit: Payer: Self-pay | Admitting: Nurse Practitioner

## 2022-08-06 DIAGNOSIS — E782 Mixed hyperlipidemia: Secondary | ICD-10-CM

## 2022-08-19 NOTE — Progress Notes (Signed)
History of Present Illness:   1.30.2024: 88 year old male, retired Education officer, environmental, native of [place] comes in the day for evaluation and management of lower urinary tract symptoms as well as a history of prostate cancer.   He had a radical prostatectomy in 1996 here in [place].  He has had no evidence of recurrence.  He has had no significant issues with stress incontinence, but has recently developed frequency, urgency, urgency incontinence and nocturia x 3.  He has a good stream, feels like he empties well.  He has very rare initial hematuria.  He is a non-smoker.   He does drink a significant amount of sodas.  He was switched to solifenacin. OAB guidesheet given.  4.23.2024:  Past Medical History:  Diagnosis Date   Cancer (HCC)    Prostate   Chronic obstructive pulmonary disease, unspecified (HCC)    Diabetes mellitus without complication (HCC)    Dizziness    Gastro-esophageal reflux disease without esophagitis    Glaucoma    Glomerular disorders in diseases classified elsewhere    Gross hematuria    Hypertensive chronic kidney disease with stage 1 through stage 4 chronic kidney disease, or unspecified chronic kidney disease    Kidney disease, chronic, stage III (moderate, EGFR 30-59 ml/min) (HCC)    Malignant neoplasm of prostate (HCC)    Prostate cancer (HCC) 11/03/2010   dx 2006   Type 2 diabetes mellitus with diabetic nephropathy (HCC)    Unspecified kidney failure     Past Surgical History:  Procedure Laterality Date   PROSTATE SURGERY      Home Medications:  Allergies as of 08/20/2022   No Known Allergies      Medication List        Accurate as of August 19, 2022  8:01 PM. If you have any questions, ask your nurse or doctor.          amLODipine 5 MG tablet Commonly known as: NORVASC Take 1 tablet (5 mg total) by mouth daily. Please schedule a follow up appt with Dr. Jimmey Ralph for further refills [phone]   glipiZIDE 5 MG 24 hr tablet Commonly known  as: GLUCOTROL XL TAKE (2) TABLETS BY MOUTH DAILY BREAKFAST.   Januvia 50 MG tablet Generic drug: sitaGLIPtin TAKE ONE TABLET BY MOUTH ONCE DAILY.   latanoprost 0.005 % ophthalmic solution Commonly known as: XALATAN 1 drop at bedtime.   linagliptin 5 MG Tabs tablet Commonly known as: Tradjenta Take 1 tablet (5 mg total) by mouth daily.   metoprolol succinate 25 MG 24 hr tablet Commonly known as: TOPROL-XL TAKE 1 TABLET BY MOUTH EVERY DAY.   rosuvastatin 5 MG tablet Commonly known as: CRESTOR TAKE ONE TABLET BY MOUTH ONCE DAILY.   solifenacin 10 MG tablet Commonly known as: VESICARE Take 1 tablet (10 mg total) by mouth daily.   traMADol 50 MG tablet Commonly known as: ULTRAM TAKE (1/2) TABLET BY MOUTH EVERY 12 HOURS AS NEEDED.   valsartan 40 MG tablet Commonly known as: DIOVAN Take 40 mg by mouth daily.        Allergies: No Known Allergies  Family History  Problem Relation Age of Onset   Stroke Father     Social History:  reports that he has never smoked. He has never used smokeless tobacco. He reports that he does not drink alcohol and does not use drugs.  ROS: A complete review of systems was performed.  All systems are negative except for pertinent findings as noted.  Physical Exam:  Vital signs in last 24 hours: There were no vitals taken for this visit. Constitutional:  Alert and oriented, No acute distress Cardiovascular: Regular rate  Respiratory: Normal respiratory effort GI: Abdomen is soft, nontender, nondistended, no abdominal masses. No CVAT.  Genitourinary: Normal male phallus, testes are descended bilaterally and non-tender and without masses, scrotum is normal in appearance without lesions or masses, perineum is normal on inspection. Lymphatic: No lymphadenopathy Neurologic: Grossly intact, no focal deficits Psychiatric: Normal mood and affect  I have reviewed prior pt notes  I have reviewed notes from referring/previous physicians  I  have reviewed urinalysis results  I have independently reviewed prior imaging  I have reviewed prior PSA results    Impression/Assessment:  ***  Plan:  ***

## 2022-08-20 ENCOUNTER — Encounter: Payer: Self-pay | Admitting: Urology

## 2022-08-20 ENCOUNTER — Ambulatory Visit: Payer: Medicare Other | Admitting: Urology

## 2022-08-20 VITALS — BP 147/55 | HR 56

## 2022-08-20 DIAGNOSIS — N3001 Acute cystitis with hematuria: Secondary | ICD-10-CM

## 2022-08-20 DIAGNOSIS — Z8546 Personal history of malignant neoplasm of prostate: Secondary | ICD-10-CM | POA: Diagnosis not present

## 2022-08-20 LAB — URINALYSIS, ROUTINE W REFLEX MICROSCOPIC
Bilirubin, UA: NEGATIVE
Glucose, UA: NEGATIVE
Ketones, UA: NEGATIVE
Nitrite, UA: POSITIVE — AB
Specific Gravity, UA: 1.015 (ref 1.005–1.030)
Urobilinogen, Ur: 0.2 mg/dL (ref 0.2–1.0)
pH, UA: 6 (ref 5.0–7.5)

## 2022-08-20 LAB — MICROSCOPIC EXAMINATION
RBC, Urine: >30 /hpf — AB (ref 0–2)
WBC, UA: >30 /hpf — AB (ref 0–5)

## 2022-08-20 MED ORDER — CEPHALEXIN 500 MG PO CAPS
500.0000 mg | ORAL_CAPSULE | Freq: Two times a day (BID) | ORAL | 0 refills | Status: DC
Start: 2022-08-20 — End: 2022-09-06

## 2022-08-23 ENCOUNTER — Telehealth: Payer: Self-pay

## 2022-08-23 LAB — URINE CULTURE

## 2022-08-23 NOTE — Patient Outreach (Signed)
  Care Coordination   08/23/2022 Name: [name] MRN: [id number] DOB: 10-29-1934   Care Coordination Outreach Attempts:  An unsuccessful telephone outreach was attempted today to offer the patient information about available care coordination services as a benefit of their health plan.   Follow Up Plan:  Additional outreach attempts will be made to offer the patient care coordination information and services.   Encounter Outcome:  No Answer   Care Coordination Interventions:  No, not indicated    Bevelyn Ngo, BSW, CDP Social Worker, Certified Dementia Practitioner [facility] Care Management  Care Coordination [phone]

## 2022-08-28 ENCOUNTER — Telehealth: Payer: Self-pay

## 2022-08-28 NOTE — Patient Outreach (Signed)
  Care Coordination   08/28/2022 Name: Richard Swanson MRN: [id number] DOB: October 05, 1934   Care Coordination Outreach Attempts:  A second unsuccessful outreach was attempted today to offer the patient with information about available care coordination services.  Follow Up Plan:  Additional outreach attempts will be made to offer the patient care coordination information and services.   Encounter Outcome:  No Answer   Care Coordination Interventions:  No, not indicated    Bevelyn Ngo, BSW, CDP Social Worker, Certified Dementia Practitioner [facility] Care Management  Care Coordination [phone]

## 2022-08-30 ENCOUNTER — Telehealth: Payer: Self-pay

## 2022-08-30 NOTE — Patient Outreach (Signed)
  Care Coordination   08/30/2022 Name: Richard Swanson MRN: [id number] DOB: 1934-11-16   Care Coordination Outreach Attempts:  A third unsuccessful outreach was attempted today to offer the patient with information about available care coordination services.  Follow Up Plan:  No further outreach attempts will be made at this time. We have been unable to contact the patient to offer or enroll patient in care coordination services  Encounter Outcome:  No Answer   Care Coordination Interventions:  No, not indicated    Bevelyn Ngo, BSW, CDP Social Worker, Certified Dementia Practitioner [facility] Care Management  Care Coordination [phone]

## 2022-09-02 ENCOUNTER — Other Ambulatory Visit: Payer: Self-pay | Admitting: Urology

## 2022-09-02 ENCOUNTER — Other Ambulatory Visit: Payer: Self-pay | Admitting: Family Medicine

## 2022-09-02 DIAGNOSIS — N3001 Acute cystitis with hematuria: Secondary | ICD-10-CM

## 2022-09-03 ENCOUNTER — Encounter: Payer: Self-pay | Admitting: Urology

## 2022-09-03 ENCOUNTER — Ambulatory Visit: Payer: Medicare Other | Admitting: Urology

## 2022-09-03 ENCOUNTER — Ambulatory Visit (INDEPENDENT_AMBULATORY_CARE_PROVIDER_SITE_OTHER): Payer: Medicare Other | Admitting: Urology

## 2022-09-03 VITALS — BP 139/62 | HR 48 | Ht 72.0 in | Wt 184.0 lb

## 2022-09-03 DIAGNOSIS — N3001 Acute cystitis with hematuria: Secondary | ICD-10-CM | POA: Diagnosis not present

## 2022-09-03 LAB — BLADDER SCAN AMB NON-IMAGING: Scan Result: 93

## 2022-09-03 NOTE — Progress Notes (Signed)
post void residual=93 

## 2022-09-03 NOTE — Progress Notes (Signed)
History of Present Illness: Here for follow-up.  He had worsening lower urinary tract symptoms but concurrently had a UTI.  Culture from his visit 2 weeks ago revealed pansensitive E. coli.  Treated with cephalexin.  Now voiding better.  No gross hematuria, no dysuria.  Unable to give urine specimen today.  Residual urine 93 mL.  Past Medical History:  Diagnosis Date   Cancer [facility])    Prostate   Chronic obstructive pulmonary disease, unspecified (HCC)    Diabetes mellitus without complication (HCC)    Dizziness    Gastro-esophageal reflux disease without esophagitis    Glaucoma    Glomerular disorders in diseases classified elsewhere    Gross hematuria    Hypertensive chronic kidney disease with stage 1 through stage 4 chronic kidney disease, or unspecified chronic kidney disease    Kidney disease, chronic, stage III (moderate, EGFR 30-59 ml/min) (HCC)    Malignant neoplasm of prostate (HCC)    Prostate cancer (HCC) 11/03/2010   dx 2006   Type 2 diabetes mellitus with diabetic nephropathy (HCC)    Unspecified kidney failure     Past Surgical History:  Procedure Laterality Date   PROSTATE SURGERY      Home Medications:  Allergies as of 09/03/2022   No Known Allergies      Medication List        Accurate as of Sep 03, 2022  9:30 AM. If you have any questions, ask your nurse or doctor.          amLODipine 5 MG tablet Commonly known as: NORVASC Take 1 tablet (5 mg total) by mouth daily. Please schedule a follow up appt with Dr. Jimmey Ralph for further refills [phone]   glipiZIDE 5 MG 24 hr tablet Commonly known as: GLUCOTROL XL TAKE (2) TABLETS BY MOUTH DAILY BREAKFAST.   Januvia 50 MG tablet Generic drug: sitaGLIPtin TAKE ONE TABLET BY MOUTH ONCE DAILY.   latanoprost 0.005 % ophthalmic solution Commonly known as: XALATAN 1 drop at bedtime.   linagliptin 5 MG Tabs tablet Commonly known as: Tradjenta Take 1 tablet (5 mg total) by mouth daily.   metoprolol  succinate 25 MG 24 hr tablet Commonly known as: TOPROL-XL TAKE 1 TABLET BY MOUTH EVERY DAY.   rosuvastatin 5 MG tablet Commonly known as: CRESTOR TAKE ONE TABLET BY MOUTH ONCE DAILY.   solifenacin 10 MG tablet Commonly known as: VESICARE Take 1 tablet (10 mg total) by mouth daily.   traMADol 50 MG tablet Commonly known as: ULTRAM TAKE (1/2) TABLET BY MOUTH EVERY 12 HOURS AS NEEDED.   valsartan 40 MG tablet Commonly known as: DIOVAN Take 40 mg by mouth daily.        Allergies: No Known Allergies  Family History  Problem Relation Age of Onset   Stroke Father     Social History:  reports that he has never smoked. He has never used smokeless tobacco. He reports that he does not drink alcohol and does not use drugs.  ROS: A complete review of systems was performed.  All systems are negative except for pertinent findings as noted.  Physical Exam:  Vital signs in last 24 hours: BP 139/62   Pulse (!) 48   Ht 6' (1.829 m)   Wt 184 lb (83.5 kg)   BMI 24.95 kg/m  Constitutional:  Alert and oriented, No acute distress Cardiovascular: Regular rate  Respiratory: Normal respiratory effort GI: Abdomen is soft, nontender, nondistended, no abdominal masses. No CVAT.  Genitourinary: Normal male phallus,  testes are descended bilaterally and non-tender and without masses, scrotum is normal in appearance without lesions or masses, perineum is normal on inspection. Lymphatic: No lymphadenopathy Neurologic: Grossly intact, no focal deficits Psychiatric: Normal mood and affect  I have reviewed prior pt notes  I have reviewed prior urinalysis results  I have reviewed prior urine culture   Impression/Assessment:  1.  Overactive bladder symptoms, on Solifenacin, improved with management of cystitis  2.  Uncomplicated cystitis, better with cephalexin  Plan:  1.  Continue second course of cephalexin  2.  Take probiotic  3.  I will see back in 3 months for quick check

## 2022-09-05 ENCOUNTER — Other Ambulatory Visit: Payer: Self-pay | Admitting: Urology

## 2022-09-05 DIAGNOSIS — N3001 Acute cystitis with hematuria: Secondary | ICD-10-CM

## 2022-09-16 ENCOUNTER — Other Ambulatory Visit: Payer: Self-pay | Admitting: Family Medicine

## 2022-09-16 DIAGNOSIS — I10 Essential (primary) hypertension: Secondary | ICD-10-CM

## 2022-09-19 ENCOUNTER — Ambulatory Visit: Payer: Medicare Other | Admitting: Nurse Practitioner

## 2022-09-19 ENCOUNTER — Encounter: Payer: Self-pay | Admitting: Nurse Practitioner

## 2022-09-19 VITALS — BP 130/64 | HR 56 | Ht 72.0 in | Wt 177.0 lb

## 2022-09-19 DIAGNOSIS — I10 Essential (primary) hypertension: Secondary | ICD-10-CM

## 2022-09-19 DIAGNOSIS — Z7984 Long term (current) use of oral hypoglycemic drugs: Secondary | ICD-10-CM

## 2022-09-19 DIAGNOSIS — E1159 Type 2 diabetes mellitus with other circulatory complications: Secondary | ICD-10-CM

## 2022-09-19 DIAGNOSIS — E782 Mixed hyperlipidemia: Secondary | ICD-10-CM

## 2022-09-19 DIAGNOSIS — E1121 Type 2 diabetes mellitus with diabetic nephropathy: Secondary | ICD-10-CM | POA: Diagnosis not present

## 2022-09-19 DIAGNOSIS — I152 Hypertension secondary to endocrine disorders: Secondary | ICD-10-CM | POA: Diagnosis not present

## 2022-09-19 LAB — POCT GLYCOSYLATED HEMOGLOBIN (HGB A1C): Hemoglobin A1C: 7.7 % — AB (ref 4.0–5.6)

## 2022-09-19 MED ORDER — GLIPIZIDE ER 5 MG PO TB24: 5.0000 mg | ORAL_TABLET | Freq: Every day | ORAL | 0 refills | Status: DC

## 2022-09-19 NOTE — Patient Instructions (Signed)

## 2022-09-19 NOTE — Progress Notes (Signed)
09/19/2022, 10:20 AM   Endocrinology follow-up note  Subjective:    Patient ID: Richard Swanson, male    DOB: 04-Feb-1935.  Adal Hansley is being seen in follow-up after he was seen in consultation for management of currently uncontrolled symptomatic diabetes requested by  Ardith Dark, MD.   Past Medical History:  Diagnosis Date   Cancer [facility])    Prostate   Chronic obstructive pulmonary disease, unspecified (HCC)    Diabetes mellitus without complication (HCC)    Dizziness    Gastro-esophageal reflux disease without esophagitis    Glaucoma    Glomerular disorders in diseases classified elsewhere    Gross hematuria    Hypertensive chronic kidney disease with stage 1 through stage 4 chronic kidney disease, or unspecified chronic kidney disease    Kidney disease, chronic, stage III (moderate, EGFR 30-59 ml/min) (HCC)    Malignant neoplasm of prostate (HCC)    Prostate cancer (HCC) 11/03/2010   dx 2006   Type 2 diabetes mellitus with diabetic nephropathy (HCC)    Unspecified kidney failure     Past Surgical History:  Procedure Laterality Date   PROSTATE SURGERY      Social History   Socioeconomic History   Marital status: Married    Spouse name: Not on file   Number of children: Not on file   Years of education: Not on file   Highest education level: Not on file  Occupational History   Occupation: Retired  Tobacco Use   Smoking status: Never   Smokeless tobacco: Never  Vaping Use   Vaping Use: Never used  Substance and Sexual Activity   Alcohol use: No   Drug use: No   Sexual activity: Not on file  Other Topics Concern   Not on file  Social History Narrative   Not on file   Social Determinants of Health   Financial Resource Strain: Low Risk  (04/16/2021)   Overall Financial Resource Strain (CARDIA)    Difficulty of Paying Living Expenses: Not hard at all  Food Insecurity: No  Food Insecurity (04/16/2021)   Hunger Vital Sign    Worried About Running Out of Food in the Last Year: Never true    Ran Out of Food in the Last Year: Never true  Transportation Needs: No Transportation Needs (04/16/2021)   PRAPARE - Administrator, Civil Service (Medical): No    Lack of Transportation (Non-Medical): No  Physical Activity: Sufficiently Active (05/06/2022)   Exercise Vital Sign    Days of Exercise per Week: 3 days    Minutes of Exercise per Session: 60 min  Stress: No Stress Concern Present (04/16/2021)   [employer] of Occupational Health - Occupational Stress Questionnaire    Feeling of Stress : Not at all  Social Connections: Socially Integrated (04/16/2021)   Social Connection and Isolation Panel [NHANES]    Frequency of Communication with Friends and Family: Once a week    Frequency of Social Gatherings with Friends and Family: Three times a week    Attends Religious Services: More than 4 times per year    Active Member of Clubs or Organizations: Yes    Attends  Club or Organization Meetings: 1 to 4 times per year    Marital Status: Married    Family History  Problem Relation Age of Onset   Stroke Father     Outpatient Encounter Medications as of 09/19/2022  Medication Sig   amLODipine (NORVASC) 5 MG tablet Take 1 tablet (5 mg total) by mouth daily. Please schedule a follow up appt with Dr. Jimmey Ralph for further refills [phone]   latanoprost (XALATAN) 0.005 % ophthalmic solution 1 drop at bedtime.   metoprolol succinate (TOPROL-XL) 25 MG 24 hr tablet TAKE 1 TABLET BY MOUTH EVERY DAY.   rosuvastatin (CRESTOR) 5 MG tablet TAKE ONE TABLET BY MOUTH ONCE DAILY.   sitaGLIPtin (JANUVIA) 50 MG tablet TAKE ONE TABLET BY MOUTH ONCE DAILY.   solifenacin (VESICARE) 10 MG tablet Take 1 tablet (10 mg total) by mouth daily.   traMADol (ULTRAM) 50 MG tablet TAKE (1/2) TABLET BY MOUTH EVERY 12 HOURS AS NEEDED.   valsartan (DIOVAN) 40 MG tablet Take 40 mg by  mouth daily.   [DISCONTINUED] glipiZIDE (GLUCOTROL XL) 5 MG 24 hr tablet TAKE (2) TABLETS BY MOUTH DAILY BREAKFAST.   [DISCONTINUED] linagliptin (TRADJENTA) 5 MG TABS tablet Take 1 tablet (5 mg total) by mouth daily.   glipiZIDE (GLUCOTROL XL) 5 MG 24 hr tablet Take 1 tablet (5 mg total) by mouth daily with breakfast.   [DISCONTINUED] cephALEXin (KEFLEX) 500 MG capsule TAKE (1) CAPSULE BY MOUTH TWICE DAILY. (Patient not taking: Reported on 09/19/2022)   No facility-administered encounter medications on file as of 09/19/2022.    ALLERGIES: No Known Allergies  VACCINATION STATUS: Immunization History  Administered Date(s) Administered   Fluad Quad(high Dose 65+) 02/14/2020, 02/20/2021, 02/25/2022   Influenza, High Dose Seasonal PF 03/23/2018   Influenza,inj,Quad PF,6+ Mos 01/30/2016, 01/29/2017, 01/28/2019   Influenza-Unspecified 12/28/2020   Moderna SARS-COV2 Booster Vaccination 03/16/2020   Moderna Sars-Covid-2 Vaccination 06/05/2019, 07/06/2019   Pneumococcal Conjugate-13 04/06/2020   Pneumococcal Polysaccharide-23 01/28/2019    Diabetes He presents for his follow-up diabetic visit. He has type 2 diabetes mellitus. Onset time: He was diagnosed at approximate age of 47 years. His disease course has been improving. There are no hypoglycemic associated symptoms. Pertinent negatives for hypoglycemia include no confusion, headaches, pallor or seizures. Associated symptoms include weight loss. Pertinent negatives for diabetes include no chest pain, no fatigue, no polydipsia, no polyphagia, no polyuria and no weakness. There are no hypoglycemic complications. Symptoms are stable. Diabetic complications include nephropathy, peripheral neuropathy and PVD. Risk factors for coronary artery disease include dyslipidemia, diabetes mellitus, hypertension, male sex and sedentary lifestyle. Current diabetic treatment includes oral agent (dual therapy). He is compliant with treatment most of the time. His  weight is decreasing steadily. He is following a generally healthy diet. When asked about meal planning, he reported none. He has not had a previous visit with a dietitian. He rarely participates in exercise. His home blood glucose trend is decreasing steadily. His breakfast blood glucose range is generally 70-90 mg/dl. (He presents today with his logs, and meter showing tightening fasting glycemic profile.  His POCT A1c today is 7.7%, improving from last visit of 8.1%.  He continues to work out 3 days a week.  He denies any significant hypoglycemia.) An ACE inhibitor/angiotensin II receptor blocker is being taken. He does not see a podiatrist.Eye exam is current.  Hyperlipidemia This is a chronic problem. The current episode started more than 1 year ago. The problem is controlled. Recent lipid tests were reviewed and are normal.  Exacerbating diseases include chronic renal disease and diabetes. Factors aggravating his hyperlipidemia include beta blockers. Pertinent negatives include no chest pain, myalgias or shortness of breath. Current antihyperlipidemic treatment includes statins. The current treatment provides mild improvement of lipids. There are no compliance problems.  Risk factors for coronary artery disease include dyslipidemia, diabetes mellitus, hypertension, male sex and a sedentary lifestyle.  Hypertension This is a chronic problem. The current episode started more than 1 year ago. The problem has been gradually improving since onset. The problem is controlled. Pertinent negatives include no chest pain, headaches, neck pain, palpitations or shortness of breath. There are no associated agents to hypertension. Risk factors for coronary artery disease include diabetes mellitus, dyslipidemia, male gender and sedentary lifestyle. Past treatments include ACE inhibitors, beta blockers and calcium channel blockers. The current treatment provides significant improvement. There are no compliance problems.   Hypertensive end-organ damage includes kidney disease and PVD. Identifiable causes of hypertension include chronic renal disease.     Review of systems  Constitutional: + steadily decreasing body weight,  current Body mass index is 24.01 kg/m. , no fatigue, no subjective hyperthermia, no subjective hypothermia Eyes: no blurry vision, no xerophthalmia ENT: no sore throat, no nodules palpated in throat, no dysphagia/odynophagia, no hoarseness Cardiovascular: no chest pain, no shortness of breath, no palpitations Respiratory: no cough, no shortness of breath Gastrointestinal: no nausea/vomiting/diarrhea, + constipation Musculoskeletal: no muscle/joint aches Skin: no rashes, no hyperemia Neurological: no tremors, no numbness, no tingling, no dizziness Psychiatric: no depression, no anxiety   Objective:    BP 130/64 (BP Location: Left Arm, Patient Position: Sitting, Cuff Size: Large)   Pulse (!) 56   Ht 6' (1.829 m)   Wt 177 lb (80.3 kg)   BMI 24.01 kg/m   Wt Readings from Last 3 Encounters:  09/19/22 177 lb (80.3 kg)  09/03/22 184 lb (83.5 kg)  05/21/22 184 lb 3.2 oz (83.6 kg)    BP Readings from Last 3 Encounters:  09/19/22 130/64  09/03/22 139/62  08/20/22 (!) 147/55     Physical Exam- Limited  Constitutional:  Body mass index is 24.01 kg/m. , not in acute distress, normal state of mind Eyes:  EOMI, no exophthalmos Musculoskeletal: no gross deformities, strength intact in all four extremities, no gross restriction of joint movements Skin:  no rashes, no hyperemia Neurological: no tremor with outstretched hands   Diabetic Foot Exam - Simple   No data filed     CMP     Component Value Date/Time   NA 136 02/18/2022 1327   NA 140 05/14/2019 0000   K 4.0 02/18/2022 1327   CL 102 02/18/2022 1327   CO2 27 02/18/2022 1327   GLUCOSE 194 (H) 02/18/2022 1327   BUN 34 (H) 02/18/2022 1327   BUN 26 (A) 05/14/2019 0000   CREATININE 2.37 (H) 02/18/2022 1327    CREATININE 2.23 (H) 04/06/2020 1206   CALCIUM 8.8 (L) 02/18/2022 1327   PROT 7.4 02/18/2022 1327   ALBUMIN 3.9 02/18/2022 1327   AST 21 02/18/2022 1327   ALT 20 02/18/2022 1327   ALKPHOS 51 02/18/2022 1327   BILITOT 0.7 02/18/2022 1327   GFRNONAA 26 (L) 02/18/2022 1327   GFRAA 29 (L) 07/29/2019 1115    Diabetic Labs (most recent): Lab Results  Component Value Date   HGBA1C 7.7 (A) 09/19/2022   HGBA1C 8.1 (A) 05/21/2022   HGBA1C 8.2 (A) 01/15/2022     Lipid Panel ( most recent) Lipid Panel  Component Value Date/Time   CHOL 130 05/03/2021 1353   TRIG 96.0 05/03/2021 1353   HDL 46.30 05/03/2021 1353   CHOLHDL 3 05/03/2021 1353   VLDL 19.2 05/03/2021 1353   LDLCALC 64 05/03/2021 1353   LDLCALC 59 04/06/2020 1206      Lab Results  Component Value Date   TSH 1.09 05/03/2021   TSH 0.49 04/06/2020   TSH 1.48 05/05/2017      Assessment & Plan:   1) Type 2 diabetes mellitus with diabetic nephropathy, without long-term current use of insulin (HCC)  - Cayle Bailey has currently uncontrolled symptomatic type 2 DM since 88 years of age.  He presents today with his logs, and meter showing tightening fasting glycemic profile.  His POCT A1c today is 7.7%, improving from last visit of 8.1%.  He continues to work out 3 days a week.  He denies any significant hypoglycemia.  - Recent labs reviewed.  - I had a long discussion with him about the progressive nature of diabetes and the pathology behind its complications. -his diabetes is complicated by CKD and he remains at a high risk for more acute and chronic complications which include CAD, CVA, CKD, retinopathy, and neuropathy. These are all discussed in detail with him.  - Nutritional counseling repeated at each appointment due to patients tendency to fall back in to old habits.  - The patient admits there is a room for improvement in their diet and drink choices. -  Suggestion is made for the patient to avoid simple  carbohydrates from their diet including Cakes, Sweet Desserts / Pastries, Ice Cream, Soda (diet and regular), Sweet Tea, Candies, Chips, Cookies, Sweet Pastries, Store Bought Juices, Alcohol in Excess of 1-2 drinks a day, Artificial Sweeteners, Coffee Creamer, and "Sugar-free" Products. This will help patient to have stable blood glucose profile and potentially avoid unintended weight gain.   - I encouraged the patient to switch to unprocessed or minimally processed complex starch and increased protein intake (animal or plant source), fruits, and vegetables.   - Patient is advised to stick to a routine mealtimes to eat 3 meals a day and avoid unnecessary snacks (to snack only to correct hypoglycemia).  - he sees Norm Salt, RDE routinely for diabetes education and meal planning.  - I have approached him with the following individualized plan to manage  his diabetes and patient agrees:   - He is advised to continue Januvia 50 mg po daily and lower Glipizide to 5 mg XL daily (1 tablet instead of 2) with breakfast.  I discussed drinking water and eating higher fiber diet to help prevent constipation.  -He is encouraged to continue monitoring blood glucose once daily, before breakfast and to call the clinic if he has readings less than 70 or greater than 200 for 3 tests in a row.  - he is not a candidate for metformin, SGLT2 inhibitors, or full dose Januvia due to concurrent renal insufficiency.  - Specific targets for  A1c;  LDL, HDL,  and Triglycerides were discussed with the patient.  2) Blood Pressure /Hypertension:  His blood pressure is controlled to target for his age. He is advised to continue Metoprolol 25 mg p.o. daily and Amlodipine 2.5 mg p.o. daily.  3) Lipids/Hyperlipidemia:   His most recent lipid panel from 05/03/21 shows controlled LDL of 64.  He is advised to continue Crestor 5 mg po daily at bedtime.  Side effects and precautions discussed with him.   4)  Weight/Diet: His  Body mass index is 24.01 kg/m.  -  he is not a candidate for weight loss.  Exercise, and detailed carbohydrates information provided  -  detailed on discharge instructions.  5) Chronic Care/Health Maintenance: -he  is on ACEI/ARB and Statin medications and is encouraged to initiate and continue to follow up with Ophthalmology, Dentist, Nephrologist, and Podiatrist at least yearly or according to recommendations, and advised to stay away from smoking. I have recommended yearly flu vaccine and pneumonia vaccine at least every 5 years; moderate intensity exercise for up to 150 minutes weekly; and  sleep for at least 7 hours a day.  - he is  advised to maintain close follow up with Ardith Dark, MD for primary care needs, as well as his other providers for optimal and coordinated care.    He is concerned about his right leg swelling.  He has used compression stockings in the past with some relief.  He is advised to mention his leg swelling to his nephrologist to see if anything can be given to help (may not be candidate for diuretic due to CKD stage 4).      I spent  25  minutes in the care of the patient today including review of labs from CMP, Lipids, Thyroid Function, Hematology (current and previous including abstractions from other facilities); face-to-face time discussing  his blood glucose readings/logs, discussing hypoglycemia and hyperglycemia episodes and symptoms, medications doses, his options of short and long term treatment based on the latest standards of care / guidelines;  discussion about incorporating lifestyle medicine;  and documenting the encounter. Risk reduction counseling performed per USPSTF guidelines to reduce obesity and cardiovascular risk factors.     Please refer to Patient Instructions for Blood Glucose Monitoring and Insulin/Medications Dosing Guide"  in media tab for additional information. Please  also refer to " Patient Self Inventory" in the Media  tab for  reviewed elements of pertinent patient history.  Peterson Ao participated in the discussions, expressed understanding, and voiced agreement with the above plans.  All questions were answered to his satisfaction. he is encouraged to contact clinic should he have any questions or concerns prior to his return visit.   Follow up plan: - Return in about 4 months (around 01/20/2023) for Diabetes F/U with A1c in office, No previsit labs, Bring meter and logs.  Ronny Bacon, [facility] [address] [place], [place] [address] Phone: [phone] Fax: [phone]   09/19/2022, 10:20 AM

## 2022-10-14 ENCOUNTER — Other Ambulatory Visit: Payer: Self-pay | Admitting: Nurse Practitioner

## 2022-10-28 ENCOUNTER — Other Ambulatory Visit: Payer: Self-pay | Admitting: Family Medicine

## 2022-10-28 NOTE — Telephone Encounter (Signed)
Pt requesting refill for Tramadol. Last OV 11/08/2021

## 2022-11-01 ENCOUNTER — Other Ambulatory Visit: Payer: Self-pay | Admitting: Family Medicine

## 2022-11-05 ENCOUNTER — Other Ambulatory Visit: Payer: Self-pay | Admitting: Family Medicine

## 2022-11-06 ENCOUNTER — Other Ambulatory Visit: Payer: Self-pay | Admitting: Family Medicine

## 2022-11-08 ENCOUNTER — Other Ambulatory Visit: Payer: Self-pay | Admitting: Family Medicine

## 2022-11-08 NOTE — Telephone Encounter (Signed)
Prescription Request  11/08/2022  LOV: 11/08/2021  What is the name of the medication or equipment? traMADol (ULTRAM) 50 MG tablet   Have you contacted your pharmacy to request a refill? Yes   Which pharmacy would you like this sent to?  [facility] - [place] - [address] [place] [address] Phone: [phone] Fax: [phone]    Patient notified that their request is being sent to the clinical staff for review and that they should receive a response within 2 business days.   Please advise at Mobile [phone] (mobile)

## 2022-11-14 ENCOUNTER — Other Ambulatory Visit: Payer: Self-pay | Admitting: Nurse Practitioner

## 2022-11-14 DIAGNOSIS — E782 Mixed hyperlipidemia: Secondary | ICD-10-CM

## 2022-11-18 ENCOUNTER — Other Ambulatory Visit: Payer: Self-pay | Admitting: Nurse Practitioner

## 2022-11-18 ENCOUNTER — Telehealth: Payer: Self-pay | Admitting: Family Medicine

## 2022-11-18 NOTE — Telephone Encounter (Signed)
Prescription Request  11/18/2022  LOV: 05/06/22  What is the name of the medication or equipment? traMADol (ULTRAM) 50 MG tablet   Have you contacted your pharmacy to request a refill? Yes   Which pharmacy would you like this sent to?  [facility] - [place], [place] - [address] [place] [address] Phone: [phone] Fax: [phone]    Patient notified that their request is being sent to the clinical staff for review and that they should receive a response within 2 business days.   Please advise at Mobile [phone] (mobile)

## 2022-11-19 NOTE — Telephone Encounter (Signed)
Message sent thru MyChart 

## 2022-11-19 NOTE — Telephone Encounter (Signed)
He is overdue for an appointment. He was last here over a year ago.  Needs to schedule an appointment before we can refill.   Katina Degree. Jimmey Ralph, MD 11/19/2022 7:23 AM

## 2022-11-20 NOTE — Telephone Encounter (Signed)
Patient has been scheduled for 11/27/22 @ 7:20 am.

## 2022-11-27 ENCOUNTER — Encounter: Payer: Self-pay | Admitting: Family Medicine

## 2022-11-27 ENCOUNTER — Ambulatory Visit: Payer: Medicare Other | Admitting: Family Medicine

## 2022-11-27 VITALS — BP 149/70 | HR 52 | Temp 98.0°F | Ht 72.0 in | Wt 180.6 lb

## 2022-11-27 DIAGNOSIS — N189 Chronic kidney disease, unspecified: Secondary | ICD-10-CM | POA: Diagnosis not present

## 2022-11-27 DIAGNOSIS — R809 Proteinuria, unspecified: Secondary | ICD-10-CM | POA: Diagnosis not present

## 2022-11-27 DIAGNOSIS — E785 Hyperlipidemia, unspecified: Secondary | ICD-10-CM

## 2022-11-27 DIAGNOSIS — E1159 Type 2 diabetes mellitus with other circulatory complications: Secondary | ICD-10-CM

## 2022-11-27 DIAGNOSIS — E1169 Type 2 diabetes mellitus with other specified complication: Secondary | ICD-10-CM | POA: Diagnosis not present

## 2022-11-27 DIAGNOSIS — I152 Hypertension secondary to endocrine disorders: Secondary | ICD-10-CM | POA: Diagnosis not present

## 2022-11-27 DIAGNOSIS — N183 Chronic kidney disease, stage 3 unspecified: Secondary | ICD-10-CM

## 2022-11-27 DIAGNOSIS — M1990 Unspecified osteoarthritis, unspecified site: Secondary | ICD-10-CM

## 2022-11-27 DIAGNOSIS — E1121 Type 2 diabetes mellitus with diabetic nephropathy: Secondary | ICD-10-CM | POA: Diagnosis not present

## 2022-11-27 DIAGNOSIS — D631 Anemia in chronic kidney disease: Secondary | ICD-10-CM | POA: Diagnosis not present

## 2022-11-27 LAB — COMPREHENSIVE METABOLIC PANEL WITH GFR
ALT: 30 U/L (ref 0–53)
AST: 23 U/L (ref 0–37)
Albumin: 3.8 g/dL (ref 3.5–5.2)
Alkaline Phosphatase: 47 U/L (ref 39–117)
BUN: 27 mg/dL — ABNORMAL HIGH (ref 6–23)
CO2: 28 meq/L (ref 19–32)
Calcium: 9.1 mg/dL (ref 8.4–10.5)
Chloride: 107 meq/L (ref 96–112)
Creatinine, Ser: 2.11 mg/dL — ABNORMAL HIGH (ref 0.40–1.50)
GFR: 27.85 mL/min — ABNORMAL LOW
Glucose, Bld: 170 mg/dL — ABNORMAL HIGH (ref 70–99)
Potassium: 4.3 meq/L (ref 3.5–5.1)
Sodium: 140 meq/L (ref 135–145)
Total Bilirubin: 0.5 mg/dL (ref 0.2–1.2)
Total Protein: 6.9 g/dL (ref 6.0–8.3)

## 2022-11-27 LAB — CBC
HCT: 35.7 % — ABNORMAL LOW (ref 39.0–52.0)
Hemoglobin: 12.3 g/dL — ABNORMAL LOW (ref 13.0–17.0)
MCHC: 34.4 g/dL (ref 30.0–36.0)
MCV: 79.2 fl (ref 78.0–100.0)
Platelets: 146 10*3/uL — ABNORMAL LOW (ref 150.0–400.0)
RBC: 4.5 Mil/uL (ref 4.22–5.81)
RDW: 14.1 % (ref 11.5–15.5)
WBC: 4.4 10*3/uL (ref 4.0–10.5)

## 2022-11-27 LAB — LIPID PANEL
Cholesterol: 120 mg/dL (ref 0–200)
HDL: 46.1 mg/dL (ref >39.00)
LDL Cholesterol: 59 mg/dL (ref 0–99)
NonHDL: 73.51
Total CHOL/HDL Ratio: 3
Triglycerides: 74 mg/dL (ref 0.0–149.0)
VLDL: 14.8 mg/dL (ref 0.0–40.0)

## 2022-11-27 LAB — TSH: TSH: 1.65 u[IU]/mL (ref 0.35–5.50)

## 2022-11-27 MED ORDER — TRAMADOL HCL 50 MG PO TABS: 25.0000 mg | ORAL_TABLET | Freq: Two times a day (BID) | ORAL | 5 refills | Status: DC | PRN

## 2022-11-27 MED ORDER — AMLODIPINE BESYLATE 10 MG PO TABS: 10.0000 mg | ORAL_TABLET | Freq: Every day | ORAL | 3 refills | Status: DC

## 2022-11-27 NOTE — Patient Instructions (Signed)
It was very nice to see you today!  We will refill your medications today.  Will increase your amlodipine to 10 mg daily.  Please continue to monitor your blood pressure at home and let us know if it is persistently 140/90 or higher.  Will check blood work today.  Return in about 6 months (around 05/30/2023).   Take care, Dr Jimmey Ralph  PLEASE NOTE:  If you had any lab tests, please let us know if you have not heard back within a few days. You may see your results on mychart before we have a chance to review them but we will give you a call once they are reviewed by [place].   If we ordered any referrals today, please let us know if you have not heard from their office within the next week.   If you had any urgent prescriptions sent in today, please check with the pharmacy within an hour of our visit to make sure the prescription was transmitted appropriately.   Please try these tips to maintain a healthy lifestyle:  Eat at least 3 REAL meals and 1-2 snacks per day.  Aim for no more than 5 hours between eating.  If you eat breakfast, please do so within one hour of getting up.   Each meal should contain half fruits/vegetables, one quarter protein, and one quarter carbs (no bigger than a computer mouse)  Cut down on sweet beverages. This includes juice, soda, and sweet tea.   Drink at least 1 glass of water with each meal and aim for at least 8 glasses per day  Exercise at least 150 minutes every week.

## 2022-11-27 NOTE — Assessment & Plan Note (Signed)
Tolerated Crestor 5 mg daily well.  Last lipid panel at goal.  Will recheck today.

## 2022-11-27 NOTE — Assessment & Plan Note (Signed)
Following with endocrinology 

## 2022-11-27 NOTE — Assessment & Plan Note (Signed)
Above goal today.  Increase amlodipine to 10 mg daily.  Continue valsartan 40 mg daily and metoprolol succinate 25 mg daily.  He will monitor at home for the next few weeks and check in with [place] in a few weeks via MyChart.

## 2022-11-27 NOTE — Assessment & Plan Note (Signed)
Database reviewed without red flags.  Tolerating tramadol 25 to 50 mg once or twice daily as needed well.  No significant side effects.  We need to avoid NSAIDs due to his CKD.  Medications help with pain and ability to perform ADLs.  Will refill tramadol today.  Follow-up again in 6 months.

## 2022-11-27 NOTE — Assessment & Plan Note (Signed)
Check c-Met.  Follows with nephrology.

## 2022-11-27 NOTE — Progress Notes (Signed)
   Richard Swanson is a 88 y.o. male who presents today for an office visit.  Assessment/Plan:  Chronic Problems Addressed Today: Osteoarthritis Database reviewed without red flags.  Tolerating tramadol 25 to 50 mg once or twice daily as needed well.  No significant side effects.  We need to avoid NSAIDs due to his CKD.  Medications help with pain and ability to perform ADLs.  Will refill tramadol today.  Follow-up again in 6 months.  Type 2 diabetes mellitus with diabetic nephropathy, without long-term current use of insulin [facility]) Following with endocrinology.  Hypertension associated with diabetes (HCC) Above goal today.  Increase amlodipine to 10 mg daily.  Continue valsartan 40 mg daily and metoprolol succinate 25 mg daily.  He will monitor at home for the next few weeks and check in with [place] in a few weeks via MyChart.  Stage 3 chronic kidney disease Check c-Met.  Follows with nephrology.  Dyslipidemia due to type 2 diabetes mellitus (HCC) Tolerated Crestor 5 mg daily well.  Last lipid panel at goal.  Will recheck today.     Subjective:  HPI:  See A/P for status of chronic conditions.Patient here today for follow-up.  Last seen over a year ago.  He needs refill on medications today.  Overall is feeling well.  No acute concerns today.       Objective:  Physical Exam: BP (!) 149/70   Pulse (!) 52   Temp 98 F (36.7 C) (Temporal)   Ht 6' (1.829 m)   Wt 180 lb 9.6 oz (81.9 kg)   SpO2 100%   BMI 24.49 kg/m   Gen: No acute distress, resting comfortably CV: Regular rate and rhythm with no murmurs appreciated Pulm: Normal work of breathing, clear to auscultation bilaterally with no crackles, wheezes, or rhonchi Neuro: Grossly normal, moves all extremities Psych: Normal affect and thought content      Richard Trice M. Jimmey Ralph, MD 11/27/2022 7:45 AM

## 2022-12-02 NOTE — Progress Notes (Signed)
Great news! Labs are all stable.  Do not need to make any changes to treatment plan at this time.  We can recheck again in a year or so.

## 2022-12-03 DIAGNOSIS — I129 Hypertensive chronic kidney disease with stage 1 through stage 4 chronic kidney disease, or unspecified chronic kidney disease: Secondary | ICD-10-CM | POA: Diagnosis not present

## 2022-12-03 DIAGNOSIS — N184 Chronic kidney disease, stage 4 (severe): Secondary | ICD-10-CM | POA: Diagnosis not present

## 2022-12-03 DIAGNOSIS — R809 Proteinuria, unspecified: Secondary | ICD-10-CM | POA: Diagnosis not present

## 2022-12-03 DIAGNOSIS — E1122 Type 2 diabetes mellitus with diabetic chronic kidney disease: Secondary | ICD-10-CM | POA: Diagnosis not present

## 2022-12-04 ENCOUNTER — Telehealth: Payer: Self-pay | Admitting: Family Medicine

## 2022-12-04 NOTE — Telephone Encounter (Signed)
Lvm for patient requesting call back to schedule a f/u with Dr.Parker to discuss recent EKG

## 2022-12-16 NOTE — Progress Notes (Incomplete)
History of Present Illness: 88 yo male is here for f/u of UTI w/ resultant LUTS.  Past Medical History:  Diagnosis Date   Cancer [facility])    Prostate   Chronic obstructive pulmonary disease, unspecified (HCC)    Diabetes mellitus without complication (HCC)    Dizziness    Gastro-esophageal reflux disease without esophagitis    Glaucoma    Glomerular disorders in diseases classified elsewhere    Gross hematuria    Hypertensive chronic kidney disease with stage 1 through stage 4 chronic kidney disease, or unspecified chronic kidney disease    Kidney disease, chronic, stage III (moderate, EGFR 30-59 ml/min) (HCC)    Malignant neoplasm of prostate (HCC)    Prostate cancer (HCC) 11/03/2010   dx 2006   Type 2 diabetes mellitus with diabetic nephropathy (HCC)    Unspecified kidney failure     Past Surgical History:  Procedure Laterality Date   PROSTATE SURGERY      Home Medications:  Allergies as of 12/17/2022   No Known Allergies      Medication List        Accurate as of December 16, 2022 12:19 PM. If you have any questions, ask your nurse or doctor.          amLODipine 10 MG tablet Commonly known as: NORVASC Take 1 tablet (10 mg total) by mouth daily. Please schedule a follow up appt with Dr. Jimmey Ralph for further refills [phone]   glipiZIDE 5 MG 24 hr tablet Commonly known as: GLUCOTROL XL Take 1 tablet (5 mg total) by mouth daily with breakfast.   Januvia 50 MG tablet Generic drug: sitaGLIPtin TAKE ONE TABLET BY MOUTH ONCE DAILY.   latanoprost 0.005 % ophthalmic solution Commonly known as: XALATAN 1 drop at bedtime.   metoprolol succinate 25 MG 24 hr tablet Commonly known as: TOPROL-XL TAKE 1 TABLET BY MOUTH EVERY DAY.   rosuvastatin 5 MG tablet Commonly known as: CRESTOR TAKE ONE TABLET BY MOUTH ONCE DAILY.   solifenacin 10 MG tablet Commonly known as: VESICARE Take 1 tablet (10 mg total) by mouth daily.   traMADol 50 MG tablet Commonly known as:  ULTRAM Take 0.5-1 tablets (25-50 mg total) by mouth every 12 (twelve) hours as needed for moderate pain or severe pain.   valsartan 40 MG tablet Commonly known as: DIOVAN Take 40 mg by mouth daily.        Allergies: No Known Allergies  Family History  Problem Relation Age of Onset   Stroke Father     Social History:  reports that he has never smoked. He has never used smokeless tobacco. He reports that he does not drink alcohol and does not use drugs.  ROS: A complete review of systems was performed.  All systems are negative except for pertinent findings as noted.  Physical Exam:  Vital signs in last 24 hours: There were no vitals taken for this visit. Constitutional:  Alert and oriented, No acute distress Cardiovascular: Regular rate  Respiratory: Normal respiratory effort GI: Abdomen is soft, nontender, nondistended, no abdominal masses. No CVAT.  Genitourinary: Normal male phallus, testes are descended bilaterally and non-tender and without masses, scrotum is normal in appearance without lesions or masses, perineum is normal on inspection. Lymphatic: No lymphadenopathy Neurologic: Grossly intact, no focal deficits Psychiatric: Normal mood and affect  I have reviewed prior pt notes  I have reviewed notes from referring/previous physicians  I have reviewed urinalysis results  I have independently reviewed prior imaging  I  have reviewed prior PSA results  I have reviewed prior urine culture   Impression/Assessment:  ***  Plan:  ***

## 2022-12-17 ENCOUNTER — Ambulatory Visit: Payer: Medicare Other | Admitting: Urology

## 2022-12-17 DIAGNOSIS — R350 Frequency of micturition: Secondary | ICD-10-CM

## 2022-12-17 DIAGNOSIS — N3001 Acute cystitis with hematuria: Secondary | ICD-10-CM

## 2022-12-20 DIAGNOSIS — H26493 Other secondary cataract, bilateral: Secondary | ICD-10-CM | POA: Diagnosis not present

## 2022-12-20 DIAGNOSIS — H401131 Primary open-angle glaucoma, bilateral, mild stage: Secondary | ICD-10-CM | POA: Diagnosis not present

## 2022-12-20 DIAGNOSIS — E1165 Type 2 diabetes mellitus with hyperglycemia: Secondary | ICD-10-CM | POA: Diagnosis not present

## 2022-12-20 DIAGNOSIS — H35033 Hypertensive retinopathy, bilateral: Secondary | ICD-10-CM | POA: Diagnosis not present

## 2022-12-25 ENCOUNTER — Other Ambulatory Visit: Payer: Self-pay | Admitting: Family Medicine

## 2022-12-25 DIAGNOSIS — I10 Essential (primary) hypertension: Secondary | ICD-10-CM

## 2023-01-03 ENCOUNTER — Telehealth: Payer: Self-pay | Admitting: Family Medicine

## 2023-01-03 DIAGNOSIS — Z0279 Encounter for issue of other medical certificate: Secondary | ICD-10-CM

## 2023-01-03 NOTE — Telephone Encounter (Signed)
Received fax from [facility] verification of chronic condition form , to be filled out by provider. Patient requested to send it back via Fax  Document is located in providers tray at front office.Please advise

## 2023-01-07 ENCOUNTER — Other Ambulatory Visit: Payer: Self-pay | Admitting: *Deleted

## 2023-01-07 NOTE — Telephone Encounter (Signed)
Form place to be reviewed in PCP office

## 2023-01-13 NOTE — Progress Notes (Signed)
History of Present Illness: Here for f/u of recurrent cystitis w/ concurrent LUTS.  No recent gross hematuria or dysuria.  Still on Solifenacin.  He wonders if he can stop this.   Past Medical History:  Diagnosis Date   Cancer [facility])    Prostate   Chronic obstructive pulmonary disease, unspecified (HCC)    Diabetes mellitus without complication (HCC)    Dizziness    Gastro-esophageal reflux disease without esophagitis    Glaucoma    Glomerular disorders in diseases classified elsewhere    Gross hematuria    Hypertensive chronic kidney disease with stage 1 through stage 4 chronic kidney disease, or unspecified chronic kidney disease    Kidney disease, chronic, stage III (moderate, EGFR 30-59 ml/min) (HCC)    Malignant neoplasm of prostate (HCC)    Prostate cancer (HCC) 11/03/2010   dx 2006   Type 2 diabetes mellitus with diabetic nephropathy (HCC)    Unspecified kidney failure     Past Surgical History:  Procedure Laterality Date   PROSTATE SURGERY      Home Medications:  Allergies as of 01/14/2023   No Known Allergies      Medication List        Accurate as of January 13, 2023  6:57 PM. If you have any questions, ask your nurse or doctor.          amLODipine 10 MG tablet Commonly known as: NORVASC Take 1 tablet (10 mg total) by mouth daily. Please schedule a follow up appt with Dr. Jimmey Ralph for further refills [phone]   glipiZIDE 5 MG 24 hr tablet Commonly known as: GLUCOTROL XL Take 1 tablet (5 mg total) by mouth daily with breakfast.   Januvia 50 MG tablet Generic drug: sitaGLIPtin TAKE ONE TABLET BY MOUTH ONCE DAILY.   latanoprost 0.005 % ophthalmic solution Commonly known as: XALATAN 1 drop at bedtime.   metoprolol succinate 25 MG 24 hr tablet Commonly known as: TOPROL-XL TAKE 1 TABLET BY MOUTH EVERY DAY.   rosuvastatin 5 MG tablet Commonly known as: CRESTOR TAKE ONE TABLET BY MOUTH ONCE DAILY.   solifenacin 10 MG tablet Commonly known as:  VESICARE Take 1 tablet (10 mg total) by mouth daily.   traMADol 50 MG tablet Commonly known as: ULTRAM Take 0.5-1 tablets (25-50 mg total) by mouth every 12 (twelve) hours as needed for moderate pain or severe pain.   valsartan 40 MG tablet Commonly known as: DIOVAN Take 40 mg by mouth daily.        Allergies: No Known Allergies  Family History  Problem Relation Age of Onset   Stroke Father     Social History:  reports that he has never smoked. He has never used smokeless tobacco. He reports that he does not drink alcohol and does not use drugs.  ROS: A complete review of systems was performed.  All systems are negative except for pertinent findings as noted.  Physical Exam:  Vital signs in last 24 hours: There were no vitals taken for this visit. Constitutional:  Alert and oriented, No acute distress Cardiovascular: Regular rate  Respiratory: Normal respiratory effort Neurologic: Grossly intact, no focal deficits Psychiatric: Normal mood and affect  I have reviewed prior pt notes  I have reviewed urinalysis results  I have independently reviewed prior imaging  I have reviewed prior urine culture  IPSS score sheet is reviewed  Impression/Assessment:  1.  Acute cystitis, resolved  2.  History of prostate cancer, 28 years out from radical prostatectomy  with no evidence of recurrence  3.  Lower urinary tract symptoms with urgency incontinence day and nighttime (worse at night)  Plan:  1.  I did recommend that he stay off of caffeinated beverages  2.  I think it is fine to try to wean off the Solifenacin.  If he feels like he needs to be on it, he might try taking it just Monday Wednesday and Fridays  3.  I will have him come back in 1 year for recheck

## 2023-01-13 NOTE — Telephone Encounter (Signed)
Form faxed [phone] and placed to be scan in patient chart

## 2023-01-14 ENCOUNTER — Encounter: Payer: Self-pay | Admitting: Urology

## 2023-01-14 ENCOUNTER — Ambulatory Visit (INDEPENDENT_AMBULATORY_CARE_PROVIDER_SITE_OTHER): Payer: Medicare HMO | Admitting: Urology

## 2023-01-14 VITALS — BP 152/69 | HR 52

## 2023-01-14 DIAGNOSIS — E119 Type 2 diabetes mellitus without complications: Secondary | ICD-10-CM | POA: Diagnosis not present

## 2023-01-14 DIAGNOSIS — Z87440 Personal history of urinary (tract) infections: Secondary | ICD-10-CM

## 2023-01-14 DIAGNOSIS — R350 Frequency of micturition: Secondary | ICD-10-CM

## 2023-01-14 DIAGNOSIS — R3915 Urgency of urination: Secondary | ICD-10-CM

## 2023-01-14 DIAGNOSIS — Z8546 Personal history of malignant neoplasm of prostate: Secondary | ICD-10-CM | POA: Diagnosis not present

## 2023-01-14 DIAGNOSIS — N3001 Acute cystitis with hematuria: Secondary | ICD-10-CM | POA: Diagnosis not present

## 2023-01-14 DIAGNOSIS — J449 Chronic obstructive pulmonary disease, unspecified: Secondary | ICD-10-CM | POA: Diagnosis not present

## 2023-01-14 LAB — URINALYSIS, ROUTINE W REFLEX MICROSCOPIC
Bilirubin, UA: NEGATIVE
Glucose, UA: NEGATIVE
Ketones, UA: NEGATIVE
Leukocytes,UA: NEGATIVE
Nitrite, UA: NEGATIVE
Protein,UA: NEGATIVE
RBC, UA: NEGATIVE
Specific Gravity, UA: 1.02 (ref 1.005–1.030)
Urobilinogen, Ur: 1 mg/dL (ref 0.2–1.0)
pH, UA: 6 (ref 5.0–7.5)

## 2023-01-16 DIAGNOSIS — H401131 Primary open-angle glaucoma, bilateral, mild stage: Secondary | ICD-10-CM | POA: Diagnosis not present

## 2023-01-22 ENCOUNTER — Encounter: Payer: Self-pay | Admitting: Nurse Practitioner

## 2023-01-22 ENCOUNTER — Ambulatory Visit (INDEPENDENT_AMBULATORY_CARE_PROVIDER_SITE_OTHER): Payer: Medicare Other | Admitting: Nurse Practitioner

## 2023-01-22 VITALS — BP 138/70 | HR 58 | Ht 72.0 in | Wt 180.4 lb

## 2023-01-22 DIAGNOSIS — E782 Mixed hyperlipidemia: Secondary | ICD-10-CM

## 2023-01-22 DIAGNOSIS — I152 Hypertension secondary to endocrine disorders: Secondary | ICD-10-CM | POA: Diagnosis not present

## 2023-01-22 DIAGNOSIS — E1159 Type 2 diabetes mellitus with other circulatory complications: Secondary | ICD-10-CM | POA: Diagnosis not present

## 2023-01-22 DIAGNOSIS — I10 Essential (primary) hypertension: Secondary | ICD-10-CM

## 2023-01-22 DIAGNOSIS — E1122 Type 2 diabetes mellitus with diabetic chronic kidney disease: Secondary | ICD-10-CM | POA: Diagnosis not present

## 2023-01-22 DIAGNOSIS — J449 Chronic obstructive pulmonary disease, unspecified: Secondary | ICD-10-CM | POA: Diagnosis not present

## 2023-01-22 DIAGNOSIS — E1121 Type 2 diabetes mellitus with diabetic nephropathy: Secondary | ICD-10-CM | POA: Diagnosis not present

## 2023-01-22 DIAGNOSIS — Z7984 Long term (current) use of oral hypoglycemic drugs: Secondary | ICD-10-CM

## 2023-01-22 LAB — POCT GLYCOSYLATED HEMOGLOBIN (HGB A1C): Hemoglobin A1C: 6.8 % — AB (ref 4.0–5.6)

## 2023-01-22 NOTE — Progress Notes (Signed)
01/22/2023, 2:20 PM   Endocrinology follow-up note  Subjective:    Patient ID: Richard Swanson, male    DOB: [birth year].  Richard Swanson is being seen in follow-up after he was seen in consultation for management of currently uncontrolled symptomatic diabetes requested by  Ardith Dark, MD.   Past Medical History:  Diagnosis Date   Cancer [facility])    Prostate   Chronic obstructive pulmonary disease, unspecified (HCC)    Diabetes mellitus without complication (HCC)    Dizziness    Gastro-esophageal reflux disease without esophagitis    Glaucoma    Glomerular disorders in diseases classified elsewhere    Gross hematuria    Hypertensive chronic kidney disease with stage 1 through stage 4 chronic kidney disease, or unspecified chronic kidney disease    Kidney disease, chronic, stage III (moderate, EGFR 30-59 ml/min) (HCC)    Malignant neoplasm of prostate (HCC)    Prostate cancer (HCC) 11/03/2010   dx 2006   Type 2 diabetes mellitus with diabetic nephropathy (HCC)    Unspecified kidney failure     Past Surgical History:  Procedure Laterality Date   PROSTATE SURGERY      Social History   Socioeconomic History   Marital status: Married    Spouse name: Not on file   Number of children: Not on file   Years of education: Not on file   Highest education level: Not on file  Occupational History   Occupation: Retired  Tobacco Use   Smoking status: Never   Smokeless tobacco: Never  Vaping Use   Vaping status: Never Used  Substance and Sexual Activity   Alcohol use: No   Drug use: No   Sexual activity: Not on file  Other Topics Concern   Not on file  Social History Narrative   Not on file   Social Determinants of Health   Financial Resource Strain: Low Risk  (04/16/2021)   Overall Financial Resource Strain (CARDIA)    Difficulty of Paying Living Expenses: Not hard at all  Food Insecurity:  No Food Insecurity (04/16/2021)   Hunger Vital Sign    Worried About Running Out of Food in the Last Year: Never true    Ran Out of Food in the Last Year: Never true  Transportation Needs: No Transportation Needs (04/16/2021)   PRAPARE - Administrator, Civil Service (Medical): No    Lack of Transportation (Non-Medical): No  Physical Activity: Sufficiently Active (05/06/2022)   Exercise Vital Sign    Days of Exercise per Week: 3 days    Minutes of Exercise per Session: 60 min  Stress: No Stress Concern Present (04/16/2021)   [employer] of Occupational Health - Occupational Stress Questionnaire    Feeling of Stress : Not at all  Social Connections: Socially Integrated (04/16/2021)   Social Connection and Isolation Panel [NHANES]    Frequency of Communication with Friends and Family: Once a week    Frequency of Social Gatherings with Friends and Family: Three times a week    Attends Religious Services: More than 4 times per year    Active Member of Clubs or Organizations: Yes    Attends  Club or Organization Meetings: 1 to 4 times per year    Marital Status: Married    Family History  Problem Relation Age of Onset   Stroke Father     Outpatient Encounter Medications as of 01/22/2023  Medication Sig   amLODipine (NORVASC) 10 MG tablet Take 1 tablet (10 mg total) by mouth daily. Please schedule a follow up appt with Dr. Jimmey Ralph for further refills [phone]   glipiZIDE (GLUCOTROL XL) 5 MG 24 hr tablet Take 1 tablet (5 mg total) by mouth daily with breakfast.   latanoprost (XALATAN) 0.005 % ophthalmic solution 1 drop at bedtime.   metoprolol succinate (TOPROL-XL) 25 MG 24 hr tablet TAKE 1 TABLET BY MOUTH EVERY DAY.   rosuvastatin (CRESTOR) 5 MG tablet TAKE ONE TABLET BY MOUTH ONCE DAILY.   sitaGLIPtin (JANUVIA) 50 MG tablet TAKE ONE TABLET BY MOUTH ONCE DAILY.   solifenacin (VESICARE) 10 MG tablet Take 1 tablet (10 mg total) by mouth daily.   traMADol (ULTRAM) 50 MG  tablet Take 0.5-1 tablets (25-50 mg total) by mouth every 12 (twelve) hours as needed for moderate pain or severe pain.   valsartan (DIOVAN) 40 MG tablet Take 40 mg by mouth daily.   No facility-administered encounter medications on file as of 01/22/2023.    ALLERGIES: No Known Allergies  VACCINATION STATUS: Immunization History  Administered Date(s) Administered   Fluad Quad(high Dose 65+) 02/14/2020, 02/20/2021, 02/25/2022   Influenza, High Dose Seasonal PF 03/23/2018   Influenza,inj,Quad PF,6+ Mos 01/30/2016, 01/29/2017, 01/28/2019   Influenza-Unspecified 12/28/2020   Moderna SARS-COV2 Booster Vaccination 03/16/2020   Moderna Sars-Covid-2 Vaccination 06/05/2019, 07/06/2019   Pneumococcal Conjugate-13 04/06/2020   Pneumococcal Polysaccharide-23 01/28/2019    Diabetes He presents for his follow-up diabetic visit. He has type 2 diabetes mellitus. Onset time: He was diagnosed at approximate age of 81 years. His disease course has been improving. There are no hypoglycemic associated symptoms. Pertinent negatives for hypoglycemia include no confusion, headaches, pallor or seizures. Pertinent negatives for diabetes include no chest pain, no fatigue, no polydipsia, no polyphagia, no polyuria, no weakness and no weight loss. There are no hypoglycemic complications. Symptoms are stable. Diabetic complications include nephropathy, peripheral neuropathy and PVD. Risk factors for coronary artery disease include dyslipidemia, diabetes mellitus, hypertension, male sex and sedentary lifestyle. Current diabetic treatment includes oral agent (dual therapy). He is compliant with treatment most of the time. His weight is decreasing steadily. He is following a generally healthy diet. When asked about meal planning, he reported none. He has not had a previous visit with a dietitian. He rarely participates in exercise. His home blood glucose trend is fluctuating minimally. His overall blood glucose range is  140-180 mg/dl. (He presents today with his logs showing mostly at goal glycemic profile.  His POCT A1c today is 6.8%, improving from last visit of 7.7%.  He denies any hypoglycemia or symptoms of such.  He notes he is in the donut hole and paying over 100 dollars for his Januvia each month.) An ACE inhibitor/angiotensin II receptor blocker is being taken. He does not see a podiatrist.Eye exam is current.  Hyperlipidemia This is a chronic problem. The current episode started more than 1 year ago. The problem is controlled. Recent lipid tests were reviewed and are normal. Exacerbating diseases include chronic renal disease and diabetes. Factors aggravating his hyperlipidemia include beta blockers. Pertinent negatives include no chest pain, myalgias or shortness of breath. Current antihyperlipidemic treatment includes statins. The current treatment provides mild improvement of lipids.  There are no compliance problems.  Risk factors for coronary artery disease include dyslipidemia, diabetes mellitus, hypertension, male sex and a sedentary lifestyle.  Hypertension This is a chronic problem. The current episode started more than 1 year ago. The problem has been gradually improving since onset. The problem is controlled. Pertinent negatives include no chest pain, headaches, neck pain, palpitations or shortness of breath. There are no associated agents to hypertension. Risk factors for coronary artery disease include diabetes mellitus, dyslipidemia, male gender and sedentary lifestyle. Past treatments include ACE inhibitors, beta blockers and calcium channel blockers. The current treatment provides significant improvement. There are no compliance problems.  Hypertensive end-organ damage includes kidney disease and PVD. Identifiable causes of hypertension include chronic renal disease.     Review of systems  Constitutional: + stable body weight,  current Body mass index is 24.47 kg/m. , no fatigue, no subjective  hyperthermia, no subjective hypothermia Eyes: no blurry vision, no xerophthalmia ENT: no sore throat, no nodules palpated in throat, no dysphagia/odynophagia, no hoarseness Cardiovascular: no chest pain, no shortness of breath, no palpitations Respiratory: no cough, no shortness of breath Gastrointestinal: no nausea/vomiting/diarrhea Musculoskeletal: no muscle/joint aches Skin: no rashes, no hyperemia Neurological: no tremors, no numbness, no tingling, no dizziness Psychiatric: no depression, no anxiety   Objective:    BP 138/70 (BP Location: Right Arm, Patient Position: Sitting, Cuff Size: Large)   Pulse (!) 58   Ht 6' (1.829 m)   Wt 180 lb 6.4 oz (81.8 kg)   BMI 24.47 kg/m   Wt Readings from Last 3 Encounters:  01/22/23 180 lb 6.4 oz (81.8 kg)  11/27/22 180 lb 9.6 oz (81.9 kg)  09/19/22 177 lb (80.3 kg)    BP Readings from Last 3 Encounters:  01/22/23 138/70  01/14/23 (!) 152/69  11/27/22 (!) 149/70     Physical Exam- Limited  Constitutional:  Body mass index is 24.47 kg/m. , not in acute distress, normal state of mind Eyes:  EOMI, no exophthalmos Musculoskeletal: no gross deformities, strength intact in all four extremities, no gross restriction of joint movements Skin:  no rashes, no hyperemia Neurological: no tremor with outstretched hands   Diabetic Foot Exam - Simple   No data filed     CMP     Component Value Date/Time   NA 140 11/27/2022 0753   NA 140 05/14/2019 0000   K 4.3 11/27/2022 0753   CL 107 11/27/2022 0753   CO2 28 11/27/2022 0753   GLUCOSE 170 (H) 11/27/2022 0753   BUN 27 (H) 11/27/2022 0753   BUN 26 (A) 05/14/2019 0000   CREATININE 2.11 (H) 11/27/2022 0753   CREATININE 2.23 (H) 04/06/2020 1206   CALCIUM 9.1 11/27/2022 0753   PROT 6.9 11/27/2022 0753   ALBUMIN 3.8 11/27/2022 0753   AST 23 11/27/2022 0753   ALT 30 11/27/2022 0753   ALKPHOS 47 11/27/2022 0753   BILITOT 0.5 11/27/2022 0753   GFRNONAA 26 (L) 02/18/2022 1327   GFRAA 29  (L) 07/29/2019 1115    Diabetic Labs (most recent): Lab Results  Component Value Date   HGBA1C 6.8 (A) 01/22/2023   HGBA1C 7.7 (A) 09/19/2022   HGBA1C 8.1 (A) 05/21/2022     Lipid Panel ( most recent) Lipid Panel     Component Value Date/Time   CHOL 120 11/27/2022 0753   TRIG 74.0 11/27/2022 0753   HDL 46.10 11/27/2022 0753   CHOLHDL 3 11/27/2022 0753   VLDL 14.8 11/27/2022 0753   LDLCALC 59 11/27/2022  0753   LDLCALC 59 04/06/2020 1206      Lab Results  Component Value Date   TSH 1.65 11/27/2022   TSH 1.09 05/03/2021   TSH 0.49 04/06/2020   TSH 1.48 05/05/2017      Assessment & Plan:   1) Type 2 diabetes mellitus with diabetic nephropathy, without long-term current use of insulin (HCC)  - Richard Swanson has currently uncontrolled symptomatic type 2 DM since 88 years of age.  He presents today with his logs showing mostly at goal glycemic profile.  His POCT A1c today is 6.8%, improving from last visit of 7.7%.  He denies any hypoglycemia or symptoms of such.  He notes he is in the donut hole and paying over 100 dollars for his Januvia each month.  - Recent labs reviewed.  - I had a long discussion with him about the progressive nature of diabetes and the pathology behind its complications. -his diabetes is complicated by CKD and he remains at a high risk for more acute and chronic complications which include CAD, CVA, CKD, retinopathy, and neuropathy. These are all discussed in detail with him.  - Nutritional counseling repeated at each appointment due to patients tendency to fall back in to old habits.  - The patient admits there is a room for improvement in their diet and drink choices. -  Suggestion is made for the patient to avoid simple carbohydrates from their diet including Cakes, Sweet Desserts / Pastries, Ice Cream, Soda (diet and regular), Sweet Tea, Candies, Chips, Cookies, Sweet Pastries, Store Bought Juices, Alcohol in Excess of 1-2 drinks a day,  Artificial Sweeteners, Coffee Creamer, and "Sugar-free" Products. This will help patient to have stable blood glucose profile and potentially avoid unintended weight gain.   - I encouraged the patient to switch to unprocessed or minimally processed complex starch and increased protein intake (animal or plant source), fruits, and vegetables.   - Patient is advised to stick to a routine mealtimes to eat 3 meals a day and avoid unnecessary snacks (to snack only to correct hypoglycemia).  - he sees Norm Salt, RDE routinely for diabetes education and meal planning.  - I have approached him with the following individualized plan to manage  his diabetes and patient agrees:   - He is advised to continue Januvia 50 mg po daily and Glipizide 5 mg XL daily with breakfast.  I did give him patient assistance form to help cover the cost of Januvia since it is helping him so much.  -He is encouraged to continue monitoring blood glucose once daily, before breakfast and to call the clinic if he has readings less than 70 or greater than 200 for 3 tests in a row.  - he is not a candidate for metformin, SGLT2 inhibitors, or full dose Januvia due to concurrent renal insufficiency.  - Specific targets for  A1c;  LDL, HDL,  and Triglycerides were discussed with the patient.  2) Blood Pressure /Hypertension:  His blood pressure is controlled to target for his age. He is advised to continue Metoprolol 25 mg p.o. daily and Amlodipine 2.5 mg p.o. daily.  3) Lipids/Hyperlipidemia:   His most recent lipid panel from 05/03/21 shows controlled LDL of 64.  He is advised to continue Crestor 5 mg po daily at bedtime.  Side effects and precautions discussed with him.   4)  Weight/Diet: His Body mass index is 24.47 kg/m.  -  he is not a candidate for weight loss.  Exercise, and detailed  carbohydrates information provided  -  detailed on discharge instructions.  5) Chronic Care/Health Maintenance: -he  is on ACEI/ARB and  Statin medications and is encouraged to initiate and continue to follow up with Ophthalmology, Dentist, Nephrologist, and Podiatrist at least yearly or according to recommendations, and advised to stay away from smoking. I have recommended yearly flu vaccine and pneumonia vaccine at least every 5 years; moderate intensity exercise for up to 150 minutes weekly; and  sleep for at least 7 hours a day.  - he is  advised to maintain close follow up with Ardith Dark, MD for primary care needs, as well as his other providers for optimal and coordinated care.    He is concerned about his right leg swelling.  He has used compression stockings in the past with some relief.  He is advised to mention his leg swelling to his nephrologist to see if anything can be given to help (may not be candidate for diuretic due to CKD stage 4).     I spent  45  minutes in the care of the patient today including review of labs from CMP, Lipids, Thyroid Function, Hematology (current and previous including abstractions from other facilities); face-to-face time discussing  his blood glucose readings/logs, discussing hypoglycemia and hyperglycemia episodes and symptoms, medications doses, his options of short and long term treatment based on the latest standards of care / guidelines;  discussion about incorporating lifestyle medicine;  and documenting the encounter. Risk reduction counseling performed per USPSTF guidelines to reduce obesity and cardiovascular risk factors.     Please refer to Patient Instructions for Blood Glucose Monitoring and Insulin/Medications Dosing Guide"  in media tab for additional information. Please  also refer to " Patient Self Inventory" in the Media  tab for reviewed elements of pertinent patient history.  Richard Swanson participated in the discussions, expressed understanding, and voiced agreement with the above plans.  All questions were answered to his satisfaction. he is encouraged to contact  clinic should he have any questions or concerns prior to his return visit.   Follow up plan: - Return in about 4 months (around 05/24/2023) for Diabetes F/U with A1c in office, No previsit labs, Bring meter and logs.  Ronny Bacon, [facility] : [facility] [address] [place], [place] [address] Phone: [phone] Fax: [phone]   01/22/2023, 2:20 PM

## 2023-01-27 DIAGNOSIS — H401131 Primary open-angle glaucoma, bilateral, mild stage: Secondary | ICD-10-CM | POA: Diagnosis not present

## 2023-02-03 ENCOUNTER — Other Ambulatory Visit: Payer: Self-pay | Admitting: Nurse Practitioner

## 2023-02-05 ENCOUNTER — Other Ambulatory Visit: Payer: Self-pay | Admitting: Nurse Practitioner

## 2023-02-05 DIAGNOSIS — E782 Mixed hyperlipidemia: Secondary | ICD-10-CM

## 2023-02-17 ENCOUNTER — Inpatient Hospital Stay: Payer: Medicare HMO | Attending: Hematology

## 2023-02-17 DIAGNOSIS — D509 Iron deficiency anemia, unspecified: Secondary | ICD-10-CM | POA: Diagnosis not present

## 2023-02-17 DIAGNOSIS — E1122 Type 2 diabetes mellitus with diabetic chronic kidney disease: Secondary | ICD-10-CM | POA: Insufficient documentation

## 2023-02-17 DIAGNOSIS — Z23 Encounter for immunization: Secondary | ICD-10-CM | POA: Diagnosis not present

## 2023-02-17 DIAGNOSIS — Z8546 Personal history of malignant neoplasm of prostate: Secondary | ICD-10-CM | POA: Diagnosis not present

## 2023-02-17 DIAGNOSIS — N184 Chronic kidney disease, stage 4 (severe): Secondary | ICD-10-CM | POA: Insufficient documentation

## 2023-02-17 DIAGNOSIS — C61 Malignant neoplasm of prostate: Secondary | ICD-10-CM

## 2023-02-17 LAB — COMPREHENSIVE METABOLIC PANEL
ALT: 31 U/L (ref 0–44)
AST: 27 U/L (ref 15–41)
Albumin: 4 g/dL (ref 3.5–5.0)
Alkaline Phosphatase: 53 U/L (ref 38–126)
Anion gap: 8 (ref 5–15)
BUN: 38 mg/dL — ABNORMAL HIGH (ref 8–23)
CO2: 25 mmol/L (ref 22–32)
Calcium: 8.7 mg/dL — ABNORMAL LOW (ref 8.9–10.3)
Chloride: 105 mmol/L (ref 98–111)
Creatinine, Ser: 2.52 mg/dL — ABNORMAL HIGH (ref 0.61–1.24)
GFR, Estimated: 24 mL/min — ABNORMAL LOW (ref >60)
Glucose, Bld: 154 mg/dL — ABNORMAL HIGH (ref 70–99)
Potassium: 4.4 mmol/L (ref 3.5–5.1)
Sodium: 138 mmol/L (ref 135–145)
Total Bilirubin: 0.4 mg/dL (ref 0.3–1.2)
Total Protein: 7.5 g/dL (ref 6.5–8.1)

## 2023-02-17 LAB — CBC WITH DIFFERENTIAL/PLATELET
Abs Immature Granulocytes: 0.07 10*3/uL (ref 0.00–0.07)
Basophils Absolute: 0.1 10*3/uL (ref 0.0–0.1)
Basophils Relative: 1 %
Eosinophils Absolute: 0.1 10*3/uL (ref 0.0–0.5)
Eosinophils Relative: 2 %
HCT: 34.8 % — ABNORMAL LOW (ref 39.0–52.0)
Hemoglobin: 11.6 g/dL — ABNORMAL LOW (ref 13.0–17.0)
Immature Granulocytes: 1 %
Lymphocytes Relative: 21 %
Lymphs Abs: 1.3 10*3/uL (ref 0.7–4.0)
MCH: 26.9 pg (ref 26.0–34.0)
MCHC: 33.3 g/dL (ref 30.0–36.0)
MCV: 80.7 fL (ref 80.0–100.0)
Monocytes Absolute: 0.5 10*3/uL (ref 0.1–1.0)
Monocytes Relative: 8 %
Neutro Abs: 4 10*3/uL (ref 1.7–7.7)
Neutrophils Relative %: 67 %
Platelets: 157 10*3/uL (ref 150–400)
RBC: 4.31 MIL/uL (ref 4.22–5.81)
RDW: 12.9 % (ref 11.5–15.5)
WBC: 6 10*3/uL (ref 4.0–10.5)
nRBC: 0 % (ref 0.0–0.2)

## 2023-02-17 LAB — IRON AND TIBC
Iron: 62 ug/dL (ref 45–182)
Saturation Ratios: 23 % (ref 17.9–39.5)
TIBC: 269 ug/dL (ref 250–450)
UIBC: 207 ug/dL

## 2023-02-17 LAB — FERRITIN: Ferritin: 144 ng/mL (ref 24–336)

## 2023-02-18 LAB — PSA: Prostatic Specific Antigen: <0.01 ng/mL (ref 0.00–4.00)

## 2023-02-24 ENCOUNTER — Inpatient Hospital Stay: Payer: Medicare HMO | Admitting: Hematology

## 2023-02-24 ENCOUNTER — Inpatient Hospital Stay: Payer: Medicare HMO

## 2023-02-24 VITALS — BP 114/62 | HR 62 | Temp 98.5°F | Resp 18 | Ht 72.0 in

## 2023-02-24 DIAGNOSIS — N184 Chronic kidney disease, stage 4 (severe): Secondary | ICD-10-CM | POA: Diagnosis not present

## 2023-02-24 DIAGNOSIS — E1122 Type 2 diabetes mellitus with diabetic chronic kidney disease: Secondary | ICD-10-CM | POA: Diagnosis not present

## 2023-02-24 DIAGNOSIS — Z8546 Personal history of malignant neoplasm of prostate: Secondary | ICD-10-CM | POA: Diagnosis not present

## 2023-02-24 DIAGNOSIS — D509 Iron deficiency anemia, unspecified: Secondary | ICD-10-CM | POA: Diagnosis not present

## 2023-02-24 DIAGNOSIS — Z23 Encounter for immunization: Secondary | ICD-10-CM

## 2023-02-24 DIAGNOSIS — C61 Malignant neoplasm of prostate: Secondary | ICD-10-CM

## 2023-02-24 MED ORDER — INFLUENZA VAC A&B SURF ANT ADJ 0.5 ML IM SUSY
0.5000 mL | PREFILLED_SYRINGE | Freq: Once | INTRAMUSCULAR | Status: AC
  Administered 2023-02-24: 0.5 mL via INTRAMUSCULAR
  Filled 2023-02-24: qty 0.5

## 2023-02-24 NOTE — Patient Instructions (Addendum)
[facility]  Discharge Instructions  You were seen and examined today by Dr. Ellin Saba.  Dr. Ellin Saba discussed your most recent lab work which revealed that everything looks good and stable.  Start taking Iron weekly.  Follow-up as scheduled in 1 year.    Thank you for choosing [facility] to provide your oncology and hematology care.   To afford each patient quality time with our provider, please arrive at least 15 minutes before your scheduled appointment time. You may need to reschedule your appointment if you arrive late (10 or more minutes). Arriving late affects you and other patients whose appointments are after yours.  Also, if you miss three or more appointments without notifying the office, you may be dismissed from the clinic at the provider's discretion.    Again, thank you for choosing [facility].  Our hope is that these requests will decrease the amount of time that you wait before being seen by our physicians.   If you have a lab appointment with the Cancer Center - please note that after April 8th, all labs will be drawn in the cancer center.  You do not have to check in or register with the main entrance as you have in the past but will complete your check-in at the cancer center.            _____________________________________________________________  Should you have questions after your visit to [facility], please contact our office at [phone] and follow the prompts.  Our office hours are 8:00 a.m. to 4:30 p.m. Monday - Thursday and 8:00 a.m. to 2:30 p.m. Friday.  Please note that voicemails left after 4:00 p.m. may not be returned until the following business day.  We are closed weekends and all major holidays.  You do have access to a nurse 24-7, just call the main number to the clinic [phone] and do not press any options, hold on the line and a nurse will answer the phone.     For prescription refill requests, have your pharmacy contact our office and allow 72 hours.    Masks are no longer required in the cancer centers. If you would like for your care team to wear a mask while they are taking care of you, please let them know. You may have one support person who is at least 86 years old accompany you for your appointments.

## 2023-02-24 NOTE — Progress Notes (Signed)
Patient in clinic today for follow up visit. Patient asked for flu shot. See MAR for injection information. Patient remained stable throughout injection. Patient discharged from clinic ambulatory and in stable condition.

## 2023-02-24 NOTE — Progress Notes (Signed)
[facility] [address], [place] [address]   CLINIC:  Medical Oncology/Hematology  PCP:  Ardith Dark, MD [address] / [place] [address] [phone]   REASON FOR VISIT:  Follow-up for prostate cancer  PRIOR THERAPY:  1. Radical prostatectomy in 06/2004. 2. Radiation from 08/01/2010 to 09/14/2010.  NGS Results: not done  CURRENT THERAPY: surveillance  BRIEF ONCOLOGIC HISTORY:  Oncology History   No history exists.    CANCER STAGING:  Cancer Staging  No matching staging information was found for the patient.   INTERVAL HISTORY:  [name], a 88 y.o. male, is seen for follow-up of iron deficiency anemia in the setting of CKD and history of prostate cancer.  Denies any new onset pains.  Reports appetite and energy levels of 100%.  REVIEW OF SYSTEMS:  Review of Systems  Constitutional:  Negative for appetite change.  Cardiovascular:  Negative for leg swelling.  Gastrointestinal:  Negative for blood in stool.  Genitourinary:  Negative for hematuria.   All other systems reviewed and are negative.   PAST MEDICAL/SURGICAL HISTORY:  Past Medical History:  Diagnosis Date   Cancer [facility])    Prostate   Chronic obstructive pulmonary disease, unspecified (HCC)    Diabetes mellitus without complication (HCC)    Dizziness    Gastro-esophageal reflux disease without esophagitis    Glaucoma    Glomerular disorders in diseases classified elsewhere    Gross hematuria    Hypertensive chronic kidney disease with stage 1 through stage 4 chronic kidney disease, or unspecified chronic kidney disease    Kidney disease, chronic, stage III (moderate, EGFR 30-59 ml/min) (HCC)    Malignant neoplasm of prostate (HCC)    Prostate cancer (HCC) 11/03/2010   dx 2006   Type 2 diabetes mellitus with diabetic nephropathy (HCC)    Unspecified kidney failure    Past Surgical History:  Procedure Laterality Date   PROSTATE SURGERY      SOCIAL  HISTORY:  Social History   Socioeconomic History   Marital status: Married    Spouse name: Not on file   Number of children: Not on file   Years of education: Not on file   Highest education level: Not on file  Occupational History   Occupation: Retired  Tobacco Use   Smoking status: Never   Smokeless tobacco: Never  Vaping Use   Vaping status: Never Used  Substance and Sexual Activity   Alcohol use: No   Drug use: No   Sexual activity: Not on file  Other Topics Concern   Not on file  Social History Narrative   Not on file   Social Determinants of Health   Financial Resource Strain: Low Risk  (04/16/2021)   Overall Financial Resource Strain (CARDIA)    Difficulty of Paying Living Expenses: Not hard at all  Food Insecurity: No Food Insecurity (04/16/2021)   Hunger Vital Sign    Worried About Running Out of Food in the Last Year: Never true    Ran Out of Food in the Last Year: Never true  Transportation Needs: No Transportation Needs (04/16/2021)   PRAPARE - Administrator, Civil Service (Medical): No    Lack of Transportation (Non-Medical): No  Physical Activity: Sufficiently Active (05/06/2022)   Exercise Vital Sign    Days of Exercise per Week: 3 days    Minutes of Exercise per Session: 60 min  Stress: No Stress Concern Present (04/16/2021)   [employer]  of Occupational Health - Occupational Stress Questionnaire    Feeling of Stress : Not at all  Social Connections: Socially Integrated (04/16/2021)   Social Connection and Isolation Panel [NHANES]    Frequency of Communication with Friends and Family: Once a week    Frequency of Social Gatherings with Friends and Family: Three times a week    Attends Religious Services: More than 4 times per year    Active Member of Clubs or Organizations: Yes    Attends Banker Meetings: 1 to 4 times per year    Marital Status: Married  Catering manager Violence: Not At Risk (05/06/2022)   Humiliation,  Afraid, Rape, and Kick questionnaire    Fear of Current or Ex-Partner: No    Emotionally Abused: No    Physically Abused: No    Sexually Abused: No    FAMILY HISTORY:  Family History  Problem Relation Age of Onset   Stroke Father     CURRENT MEDICATIONS:  Current Outpatient Medications  Medication Sig Dispense Refill   amLODipine (NORVASC) 10 MG tablet Take 1 tablet (10 mg total) by mouth daily. Please schedule a follow up appt with Dr. Jimmey Ralph for further refills [phone] 90 tablet 3   glipiZIDE (GLUCOTROL XL) 5 MG 24 hr tablet Take 1 tablet (5 mg total) by mouth daily with breakfast. 30 tablet 5   JANUVIA 50 MG tablet TAKE ONE TABLET BY MOUTH ONCE DAILY. 30 tablet 3   latanoprost (XALATAN) 0.005 % ophthalmic solution 1 drop at bedtime.     metoprolol succinate (TOPROL-XL) 25 MG 24 hr tablet TAKE 1 TABLET BY MOUTH EVERY DAY. 90 tablet 0   rosuvastatin (CRESTOR) 5 MG tablet TAKE ONE TABLET BY MOUTH ONCE DAILY. 90 tablet 0   solifenacin (VESICARE) 10 MG tablet Take 1 tablet (10 mg total) by mouth daily. 30 tablet 11   traMADol (ULTRAM) 50 MG tablet Take 0.5-1 tablets (25-50 mg total) by mouth every 12 (twelve) hours as needed for moderate pain or severe pain. 30 tablet 5   valsartan (DIOVAN) 40 MG tablet Take 40 mg by mouth daily.     No current facility-administered medications for this visit.   Facility-Administered Medications Ordered in Other Visits  Medication Dose Route Frequency Provider Last Rate Last Admin   influenza vaccine adjuvanted (FLUAD) injection 0.5 mL  0.5 mL Intramuscular Once Doreatha Massed, MD        ALLERGIES:  No Known Allergies  PHYSICAL EXAM:  Performance status (ECOG): 1 - Symptomatic but completely ambulatory  Vitals:   02/24/23 1512  BP: 114/62  Pulse: 62  Resp: 18  Temp: 98.5 F (36.9 C)  SpO2: 100%   Wt Readings from Last 3 Encounters:  01/22/23 180 lb 6.4 oz (81.8 kg)  11/27/22 180 lb 9.6 oz (81.9 kg)  09/19/22 177 lb (80.3  kg)   Physical Exam Vitals reviewed.  Constitutional:      Appearance: Normal appearance.  Cardiovascular:     Rate and Rhythm: Normal rate and regular rhythm.     Pulses: Normal pulses.     Heart sounds: Normal heart sounds.  Pulmonary:     Effort: Pulmonary effort is normal.     Breath sounds: Normal breath sounds.  Neurological:     General: No focal deficit present.     Mental Status: He is alert and oriented to person, place, and time.  Psychiatric:        Mood and Affect: Mood normal.  Behavior: Behavior normal.      LABORATORY DATA:  I have reviewed the labs as listed.     Latest Ref Rng & Units 02/17/2023    2:01 PM 11/27/2022    7:53 AM 02/18/2022    1:27 PM  CBC  WBC 4.0 - 10.5 K/uL 6.0  4.4  4.7   Hemoglobin 13.0 - 17.0 g/dL 63.8  75.6  43.3   Hematocrit 39.0 - 52.0 % 34.8  35.7  35.6   Platelets 150 - 400 K/uL 157  146.0  147       Latest Ref Rng & Units 02/17/2023    2:01 PM 11/27/2022    7:53 AM 02/18/2022    1:27 PM  CMP  Glucose 70 - 99 mg/dL 295  188  416   BUN 8 - 23 mg/dL 38  27  34   Creatinine 0.61 - 1.24 mg/dL 6.06  3.01  6.01   Sodium 135 - 145 mmol/L 138  140  136   Potassium 3.5 - 5.1 mmol/L 4.4  4.3  4.0   Chloride 98 - 111 mmol/L 105  107  102   CO2 22 - 32 mmol/L 25  28  27    Calcium 8.9 - 10.3 mg/dL 8.7  9.1  8.8   Total Protein 6.5 - 8.1 g/dL 7.5  6.9  7.4   Total Bilirubin 0.3 - 1.2 mg/dL 0.4  0.5  0.7   Alkaline Phos 38 - 126 U/L 53  47  51   AST 15 - 41 U/L 27  23  21    ALT 0 - 44 U/L 31  30  20      DIAGNOSTIC IMAGING:  I have independently reviewed the scans and discussed with the patient. No results found.   ASSESSMENT:  1.  Prostate cancer: -Gleason score 5, status post radical prostatectomy in March 2006 by Dr. Jerre Simon. -Status post radiation by Dr. Dayton Scrape on 08/01/2010 through 09/14/2010   2.  CKD: -Followed by Dr. Wolfgang Phoenix.   PLAN:  1.  Prostate cancer: - His PSA continues to be undetectable.   2.   Normocytic anemia: - Anemia from CKD and functional iron deficiency. - Ferritin is 144, percent saturation 23.  Hemoglobin is 11.6, down from 12.3 previously. - Recommend that he start taking iron tablet once a week. - RTC 1 year for follow-up with repeat CBC, ferritin and iron panel.  Will also check B12 and folic acid at that time.    Orders placed this encounter:  No orders of the defined types were placed in this encounter.     Doreatha Massed, MD [facility] [phone]

## 2023-02-24 NOTE — Patient Instructions (Signed)
[facility]  Discharge Instructions: Thank you for choosing [facility] to provide your oncology and hematology care.  If you have a lab appointment with the Cancer Center - please note that after April 8th, 2024, all labs will be drawn in the cancer center.  You do not have to check in or register with the main entrance as you have in the past but will complete your check-in in the cancer center.  Wear comfortable clothing and clothing appropriate for easy access to any Portacath or PICC line.   We strive to give you quality time with your provider. You may need to reschedule your appointment if you arrive late (15 or more minutes).  Arriving late affects you and other patients whose appointments are after yours.  Also, if you miss three or more appointments without notifying the office, you may be dismissed from the clinic at the provider's discretion.      For prescription refill requests, have your pharmacy contact our office and allow 72 hours for refills to be completed.    Today you received the flu shot.   To help prevent nausea and vomiting after your treatment, we encourage you to take your nausea medication as directed.  BELOW ARE SYMPTOMS THAT SHOULD BE REPORTED IMMEDIATELY: *FEVER GREATER THAN 100.4 F (38 C) OR HIGHER *CHILLS OR SWEATING *NAUSEA AND VOMITING THAT IS NOT CONTROLLED WITH YOUR NAUSEA MEDICATION *UNUSUAL SHORTNESS OF BREATH *UNUSUAL BRUISING OR BLEEDING *URINARY PROBLEMS (pain or burning when urinating, or frequent urination) *BOWEL PROBLEMS (unusual diarrhea, constipation, pain near the anus) TENDERNESS IN MOUTH AND THROAT WITH OR WITHOUT PRESENCE OF ULCERS (sore throat, sores in mouth, or a toothache) UNUSUAL RASH, SWELLING OR PAIN  UNUSUAL VAGINAL DISCHARGE OR ITCHING   Items with * indicate a potential emergency and should be followed up as soon as possible or go to the Emergency Department if any problems should  occur.  Please show the CHEMOTHERAPY ALERT CARD or IMMUNOTHERAPY ALERT CARD at check-in to the Emergency Department and triage nurse.  Should you have questions after your visit or need to cancel or reschedule your appointment, please contact [facility] [phone]  and follow the prompts.  Office hours are 8:00 a.m. to 4:30 p.m. Monday - Friday. Please note that voicemails left after 4:00 p.m. may not be returned until the following business day.  We are closed weekends and major holidays. You have access to a nurse at all times for urgent questions. Please call the main number to the clinic [phone] and follow the prompts.  For any non-urgent questions, you may also contact your provider using MyChart. We now offer e-Visits for anyone 6 and older to request care online for non-urgent symptoms. For details visit [email or web address].   Also download the MyChart app! Go to the app store, search "MyChart", open the app, select [place], and log in with your MyChart username and password.

## 2023-03-11 ENCOUNTER — Encounter: Payer: Self-pay | Admitting: Family Medicine

## 2023-03-11 ENCOUNTER — Ambulatory Visit (INDEPENDENT_AMBULATORY_CARE_PROVIDER_SITE_OTHER): Payer: Medicare HMO | Admitting: Family Medicine

## 2023-03-11 VITALS — BP 137/78 | HR 55 | Temp 97.5°F | Ht 72.0 in | Wt 175.6 lb

## 2023-03-11 DIAGNOSIS — N189 Chronic kidney disease, unspecified: Secondary | ICD-10-CM

## 2023-03-11 DIAGNOSIS — D631 Anemia in chronic kidney disease: Secondary | ICD-10-CM

## 2023-03-11 DIAGNOSIS — E1159 Type 2 diabetes mellitus with other circulatory complications: Secondary | ICD-10-CM

## 2023-03-11 DIAGNOSIS — I152 Hypertension secondary to endocrine disorders: Secondary | ICD-10-CM

## 2023-03-11 DIAGNOSIS — R2681 Unsteadiness on feet: Secondary | ICD-10-CM | POA: Diagnosis not present

## 2023-03-11 NOTE — Assessment & Plan Note (Signed)
Blood pressure at goal today on amlodipine 10 mg daily, valsartan 40 mg daily, and metoprolol succinate 25 mg daily.

## 2023-03-11 NOTE — Progress Notes (Signed)
   Richard Swanson is a 88 y.o. male who presents today for an office visit.  Assessment/Plan:  Chronic Problems Addressed Today: Unsteady gait No red flags.  Reassuring exam today.  Likely has some underlying deconditioning however no obvious abnormalities on exam today.  He does have a known history of anemia which could be contributing as well however denies any presyncopal symptoms or dizziness.  We did discuss referral to PT for gait evaluation and training to improve his gait and prevent falls , however patient declined.  He would like to continue with watchful waiting for now.  He will let us know if he has any change in symptoms or if he suffers any falls.  Hypertension associated with diabetes (HCC) Blood pressure at goal today on amlodipine 10 mg daily, valsartan 40 mg daily, and metoprolol succinate 25 mg daily.  Anemia in chronic kidney disease Hemoglobin has been stable for many years and stable on last recheck a few weeks ago-do not think that this is contributing to his unsteady gait.     Subjective:  HPI:  See assessment / plan for status of chronic conditions. Patient is here today with concern for unsteady gait.  Patient also not overly concerned with this however his family members want him to get evaluated.  He does admit that he has been more unsteady and walking slower.  This been going on for several months to years.  He denies any falls but has stumbled a few times.  No injuries.  No obvious precipitating events.  No weakness.  No dizziness.  No lightheadedness.        Objective:  Physical Exam: BP 137/78   Pulse (!) 55   Temp (!) 97.5 F (36.4 C) (Temporal)   Ht 6' (1.829 m)   Wt 175 lb 9.6 oz (79.7 kg)   SpO2 100%   BMI 23.82 kg/m   Gen: No acute distress, resting comfortably CV: Regular rate and rhythm with no murmurs appreciated Pulm: Normal work of breathing, clear to auscultation bilaterally with no crackles, wheezes, or rhonchi Neuro: Cranial  nerves III through XII intact.  Finger-nose-finger testing intact bilaterally.  Strength out of 5 in upper and lower extremities. - Gait: Slow shuffled gait noted. MUSCULOSKELETAL: - Legs: No deformities.  Good muscle tone throughout.  Reflexes 2+ and symmetric bilaterally.  Strength 5 out of 5 throughout all fields.  Sensation light touch intact throughout. Psych: Normal affect and thought content      Richard Swanson M. Richard Ralph, MD 03/11/2023 11:43 AM

## 2023-03-11 NOTE — Assessment & Plan Note (Signed)
No red flags.  Reassuring exam today.  Likely has some underlying deconditioning however no obvious abnormalities on exam today.  He does have a known history of anemia which could be contributing as well however denies any presyncopal symptoms or dizziness.  We did discuss referral to PT for gait evaluation and training to improve his gait and prevent falls , however patient declined.  He would like to continue with watchful waiting for now.  He will let us know if he has any change in symptoms or if he suffers any falls.

## 2023-03-11 NOTE — Assessment & Plan Note (Signed)
Hemoglobin has been stable for many years and stable on last recheck a few weeks ago-do not think that this is contributing to his unsteady gait.

## 2023-03-11 NOTE — Patient Instructions (Signed)
It was very nice to see you today!  You probably have deconditioning in your legs and hips.  Let me know if you want to see a physical therapist.  Please let us know if you have any other change in symptoms.  Return if symptoms worsen or fail to improve.   Take care, Dr Jimmey Ralph  PLEASE NOTE:  If you had any lab tests, please let us know if you have not heard back within a few days. You may see your results on mychart before we have a chance to review them but we will give you a call once they are reviewed by [place].   If we ordered any referrals today, please let us know if you have not heard from their office within the next week.   If you had any urgent prescriptions sent in today, please check with the pharmacy within an hour of our visit to make sure the prescription was transmitted appropriately.   Please try these tips to maintain a healthy lifestyle:  Eat at least 3 REAL meals and 1-2 snacks per day.  Aim for no more than 5 hours between eating.  If you eat breakfast, please do so within one hour of getting up.   Each meal should contain half fruits/vegetables, one quarter protein, and one quarter carbs (no bigger than a computer mouse)  Cut down on sweet beverages. This includes juice, soda, and sweet tea.   Drink at least 1 glass of water with each meal and aim for at least 8 glasses per day  Exercise at least 150 minutes every week.

## 2023-03-13 DIAGNOSIS — H401131 Primary open-angle glaucoma, bilateral, mild stage: Secondary | ICD-10-CM | POA: Diagnosis not present

## 2023-03-21 ENCOUNTER — Other Ambulatory Visit: Payer: Self-pay | Admitting: Family Medicine

## 2023-03-21 DIAGNOSIS — I10 Essential (primary) hypertension: Secondary | ICD-10-CM

## 2023-04-07 DIAGNOSIS — R809 Proteinuria, unspecified: Secondary | ICD-10-CM | POA: Diagnosis not present

## 2023-04-07 DIAGNOSIS — E1129 Type 2 diabetes mellitus with other diabetic kidney complication: Secondary | ICD-10-CM | POA: Diagnosis not present

## 2023-04-07 DIAGNOSIS — E1122 Type 2 diabetes mellitus with diabetic chronic kidney disease: Secondary | ICD-10-CM | POA: Diagnosis not present

## 2023-04-07 DIAGNOSIS — N1832 Chronic kidney disease, stage 3b: Secondary | ICD-10-CM | POA: Diagnosis not present

## 2023-04-15 DIAGNOSIS — E1129 Type 2 diabetes mellitus with other diabetic kidney complication: Secondary | ICD-10-CM | POA: Diagnosis not present

## 2023-04-15 DIAGNOSIS — N184 Chronic kidney disease, stage 4 (severe): Secondary | ICD-10-CM | POA: Diagnosis not present

## 2023-04-15 DIAGNOSIS — R809 Proteinuria, unspecified: Secondary | ICD-10-CM | POA: Diagnosis not present

## 2023-04-15 DIAGNOSIS — C61 Malignant neoplasm of prostate: Secondary | ICD-10-CM | POA: Diagnosis not present

## 2023-05-09 ENCOUNTER — Other Ambulatory Visit: Payer: Self-pay | Admitting: Nurse Practitioner

## 2023-05-11 ENCOUNTER — Other Ambulatory Visit: Payer: Self-pay | Admitting: Nurse Practitioner

## 2023-05-12 ENCOUNTER — Ambulatory Visit (INDEPENDENT_AMBULATORY_CARE_PROVIDER_SITE_OTHER): Payer: Medicare HMO

## 2023-05-12 VITALS — Wt 175.0 lb

## 2023-05-12 DIAGNOSIS — Z0000 Encounter for general adult medical examination without abnormal findings: Secondary | ICD-10-CM | POA: Diagnosis not present

## 2023-05-12 NOTE — Progress Notes (Signed)
 Subjective:   Richard Swanson is a 89 y.o. male who presents for Medicare Annual/Subsequent preventive examination.  Visit Complete: Virtual I connected with  Sudie Louder on 05/12/23 by a audio enabled telemedicine application and verified that I am speaking with the correct person using two identifiers.  Patient Location: Home  Provider Location: Office/Clinic  I discussed the limitations of evaluation and management by telemedicine. The patient expressed understanding and agreed to proceed.  Vital Signs: Because this visit was a virtual/telehealth visit, some criteria may be missing or patient reported. Any vitals not documented were not able to be obtained and vitals that have been documented are patient reported.  Cardiac Risk Factors include: advanced age (>47men, >10 women);diabetes mellitus;dyslipidemia;hypertension;male gender     Objective:    Today's Vitals   05/12/23 1310  Weight: 175 lb (79.4 kg)   Body mass index is 23.73 kg/m.     05/12/2023    1:14 PM 02/24/2023    3:15 PM 05/06/2022    1:09 PM 02/25/2022    3:36 PM 04/16/2021    1:20 PM 02/20/2021    2:46 PM 08/21/2020    3:08 PM  Advanced Directives  Does Patient Have a Medical Advance Directive? Yes No Yes No Yes No No  Type of Estate Agent of [place];Living will  Healthcare Power of [place];Living will  Healthcare Power of Attorney    Copy of Healthcare Power of Attorney in Chart? No - copy requested  No - copy requested  No - copy requested    Would patient like information on creating a medical advance directive?  No - Patient declined  No - Patient declined  No - Patient declined No - Patient declined    Current Medications (verified) Outpatient Encounter Medications as of 05/12/2023  Medication Sig   amLODipine  (NORVASC ) 10 MG tablet Take 1 tablet (10 mg total) by mouth daily. Please schedule a follow up appt with Dr. Kennyth for further refills [phone]   glipiZIDE   (GLUCOTROL  XL) 5 MG 24 hr tablet TAKE 1 TABLET BY MOUTH DAILY WITH BREAKFAST   latanoprost  (XALATAN ) 0.005 % ophthalmic solution 1 drop at bedtime.   metoprolol  succinate (TOPROL -XL) 25 MG 24 hr tablet TAKE 1 TABLET BY MOUTH EVERY DAY.   rosuvastatin  (CRESTOR ) 5 MG tablet TAKE ONE TABLET BY MOUTH ONCE DAILY.   sitaGLIPtin  (JANUVIA ) 50 MG tablet Take 1 tablet (50 mg total) by mouth daily.   solifenacin  (VESICARE ) 10 MG tablet Take 1 tablet (10 mg total) by mouth daily.   traMADol  (ULTRAM ) 50 MG tablet Take 0.5-1 tablets (25-50 mg total) by mouth every 12 (twelve) hours as needed for moderate pain or severe pain.   valsartan (DIOVAN) 40 MG tablet Take 40 mg by mouth daily.   No facility-administered encounter medications on file as of 05/12/2023.    Allergies (verified) Patient has no known allergies.   History: Past Medical History:  Diagnosis Date   Cancer (HCC)    Prostate   Chronic obstructive pulmonary disease, unspecified (HCC)    Diabetes mellitus without complication (HCC)    Dizziness    Gastro-esophageal reflux disease without esophagitis    Glaucoma    Glomerular disorders in diseases classified elsewhere    Gross hematuria    Hypertensive chronic kidney disease with stage 1 through stage 4 chronic kidney disease, or unspecified chronic kidney disease    Kidney disease, chronic, stage III (moderate, EGFR 30-59 ml/min) (HCC)    Malignant neoplasm of prostate (HCC)  Prostate cancer (HCC) 11/03/2010   dx 2006   Type 2 diabetes mellitus with diabetic nephropathy (HCC)    Unspecified kidney failure    Past Surgical History:  Procedure Laterality Date   PROSTATE SURGERY     Family History  Problem Relation Age of Onset   Stroke Father    Social History   Socioeconomic History   Marital status: Married    Spouse name: Not on file   Number of children: Not on file   Years of education: Not on file   Highest education level: Not on file  Occupational History    Occupation: Retired  Tobacco Use   Smoking status: Never   Smokeless tobacco: Never  Vaping Use   Vaping status: Never Used  Substance and Sexual Activity   Alcohol use: No   Drug use: No   Sexual activity: Not on file  Other Topics Concern   Not on file  Social History Narrative   Not on file   Social Drivers of Health   Financial Resource Strain: Low Risk  (05/12/2023)   Overall Financial Resource Strain (CARDIA)    Difficulty of Paying Living Expenses: Not hard at all  Food Insecurity: No Food Insecurity (05/12/2023)   Hunger Vital Sign    Worried About Running Out of Food in the Last Year: Never true    Ran Out of Food in the Last Year: Never true  Transportation Needs: No Transportation Needs (05/12/2023)   PRAPARE - Administrator, Civil Service (Medical): No    Lack of Transportation (Non-Medical): No  Physical Activity: Sufficiently Active (05/12/2023)   Exercise Vital Sign    Days of Exercise per Week: 3 days    Minutes of Exercise per Session: 60 min  Stress: No Stress Concern Present (05/12/2023)   [employer] of Occupational Health - Occupational Stress Questionnaire    Feeling of Stress : Not at all  Social Connections: Socially Integrated (05/12/2023)   Social Connection and Isolation Panel [NHANES]    Frequency of Communication with Friends and Family: More than three times a week    Frequency of Social Gatherings with Friends and Family: More than three times a week    Attends Religious Services: More than 4 times per year    Active Member of [employer] or Organizations: Yes    Attends Banker Meetings: 1 to 4 times per year    Marital Status: Married    Tobacco Counseling Counseling given: Not Answered   Clinical Intake:  Pre-visit preparation completed: Yes  Pain : No/denies pain     BMI - recorded: 23.73 Nutritional Status: BMI of 19-24  Normal Nutritional Risks: None Diabetes: Yes CBG done?: Yes (130 per pt) CBG  resulted in Enter/ Edit results?: No Did pt. bring in CBG monitor from home?: No  How often do you need to have someone help you when you read instructions, pamphlets, or other written materials from your doctor or pharmacy?: 1 - Never  Interpreter Needed?: No  Information entered by :: Ellouise Haws, LPN   Activities of Daily Living    05/12/2023    1:12 PM  In your present state of health, do you have any difficulty performing the following activities:  Hearing? 1  Comment tinnitus  Vision? 0  Difficulty concentrating or making decisions? 0  Walking or climbing stairs? 0  Dressing or bathing? 0  Doing errands, shopping? 0  Preparing Food and eating ? N  Using the Toilet?  N  In the past six months, have you accidently leaked urine? N  Do you have problems with loss of bowel control? N  Managing your Medications? N  Managing your Finances? N  Housekeeping or managing your Housekeeping? N    Patient Care Team: Kennyth Worth HERO, MD as PCP - General (Family Medicine) Charls Pearla LABOR, MD (Inactive) as PCP - Cardiology (Cardiology) Rachele Gaynell RAMAN, MD as Consulting Physician (Nephrology)  Indicate any recent Medical Services you may have received from other than [facility] providers in the past year (date may be approximate).     Assessment:   This is a routine wellness examination for [place].  Hearing/Vision screen Hearing Screening - Comments:: Pt has tinnitus denies any hearing issues  Vision Screening - Comments:: Pt follows up with Dr Marcey at [facility] for annual eye exams    Goals Addressed             This Visit's Progress    Patient Stated       Maintain health and activity        Depression Screen    05/12/2023    1:14 PM 03/11/2023   11:20 AM 11/27/2022    7:25 AM 05/06/2022    1:11 PM 11/08/2021    1:03 PM 04/16/2021    1:19 PM 04/03/2020    1:30 PM  PHQ 2/9 Scores  PHQ - 2 Score 0 0 0 0 0 0 0    Fall Risk    05/12/2023    1:16 PM  03/11/2023   11:20 AM 11/27/2022    7:25 AM 05/02/2022    7:16 PM 11/08/2021    1:03 PM  Fall Risk   Falls in the past year? 0 0 0 0 0  Number falls in past yr: 0 0 0 0 0  Injury with Fall? 0 0 0 0 0  Risk for fall due to : No Fall Risks No Fall Risks No Fall Risks Impaired vision   Follow up Falls prevention discussed   Falls prevention discussed     MEDICARE RISK AT HOME: Medicare Risk at Home Any stairs in or around the home?: No If so, are there any without handrails?: No Home free of loose throw rugs in walkways, pet beds, electrical cords, etc?: Yes Adequate lighting in your home to reduce risk of falls?: Yes Life alert?: No Use of a cane, walker or w/c?: No Grab bars in the bathroom?: Yes Shower chair or bench in shower?: No Elevated toilet seat or a handicapped toilet?: No  TIMED UP AND GO:  Was the test performed?  No    Cognitive Function:        05/12/2023    1:16 PM 05/06/2022    1:12 PM 04/16/2021    1:23 PM 04/03/2020    1:37 PM  6CIT Screen  What Year? 0 points 0 points 0 points 0 points  What month? 0 points 0 points 0 points 0 points  What time? 0 points 0 points 0 points   Count back from 20 0 points 0 points 0 points 0 points  Months in reverse 0 points 0 points 0 points 0 points  Repeat phrase 0 points 0 points 0 points 0 points  Total Score 0 points 0 points 0 points     Immunizations Immunization History  Administered Date(s) Administered   Fluad Quad(high Dose 65+) 02/14/2020, 02/20/2021, 02/25/2022   Fluad Trivalent(High Dose 65+) 02/24/2023   Influenza, High Dose Seasonal PF 03/23/2018  Influenza,inj,Quad PF,6+ Mos 01/30/2016, 01/29/2017, 01/28/2019   Influenza-Unspecified 12/28/2020   Moderna SARS-COV2 Booster Vaccination 03/16/2020   Moderna Sars-Covid-2 Vaccination 06/05/2019, 07/06/2019   Pneumococcal Conjugate-13 04/06/2020   Pneumococcal Polysaccharide-23 01/28/2019       Flu Vaccine status: Up to date  Pneumococcal vaccine  status: Up to date  Covid-19 vaccine status: Completed vaccines  Qualifies for Shingles Vaccine? Yes   Zostavax completed No   Shingrix Completed?: No.    Education has been provided regarding the importance of this vaccine. Patient has been advised to call insurance company to determine out of pocket expense if they have not yet received this vaccine. Advised may also receive vaccine at local pharmacy or Health Dept. Verbalized acceptance and understanding.  Screening Tests Health Maintenance  Topic Date Due   DTaP/Tdap/Td  Never done   OPHTHALMOLOGY EXAM  02/20/2022   Zoster Vaccines- Shingrix (1 of 2) 06/11/2023 (Originally 07/18/1953)   FOOT EXAM  05/22/2023   HEMOGLOBIN A1C  07/22/2023   Medicare Annual Wellness (AWV)  05/11/2024   Pneumonia Vaccine 56+ Years old  Completed   INFLUENZA VACCINE  Completed   HPV VACCINES  Aged Out   COVID-19 Vaccine  Discontinued    Health Maintenance  Health Maintenance Due  Topic Date Due   DTaP/Tdap/Td  Never done   OPHTHALMOLOGY EXAM  02/20/2022    Colorectal cancer screening: No longer required.    Additional Screening:   Vision Screening: Recommended annual ophthalmology exams for early detection of glaucoma and other disorders of the eye. Is the patient up to date with their annual eye exam?  No  Who is the provider or what is the name of the office in which the patient attends annual eye exams? Dr Marcey  If pt is not established with a provider, would they like to be referred to a provider to establish care? No .   Dental Screening: Recommended annual dental exams for proper oral hygiene  Diabetic Foot Exam: Diabetic Foot Exam: Completed 05/21/22  Community Resource Referral / Chronic Care Management: CRR required this visit?  No   CCM required this visit?  No     Plan:     I have personally reviewed and noted the following in the patient's chart:   Medical and social history Use of alcohol, tobacco or illicit drugs   Current medications and supplements including opioid prescriptions. Patient is currently taking opioid prescriptions. Information provided to patient regarding non-opioid alternatives. Patient advised to discuss non-opioid treatment plan with their provider. Functional ability and status Nutritional status Physical activity Advanced directives List of other physicians Hospitalizations, surgeries, and ER visits in previous 12 months Vitals Screenings to include cognitive, depression, and falls Referrals and appointments  In addition, I have reviewed and discussed with patient certain preventive protocols, quality metrics, and best practice recommendations. A written personalized care plan for preventive services as well as general preventive health recommendations were provided to patient.     Ellouise VEAR Haws, LPN   [id number]   After Visit Summary: (MyChart) Due to this being a telephonic visit, the after visit summary with patients personalized plan was offered to patient via MyChart   Nurse Notes: This patient declined Interactive audio and acupuncturist. Therefore the visit was completed with audio only. Pt preferred telephonic also due to weather and was unable to get travel

## 2023-05-12 NOTE — Patient Instructions (Addendum)
 Richard Swanson , Thank you for taking time to come for your Medicare Wellness Visit. I appreciate your ongoing commitment to your health goals. Please review the following plan we discussed and let me know if I can assist you in the future.   Referrals/Orders/Follow-Ups/Clinician Recommendations: Aim for 30 minutes of exercise or brisk walking, 6-8 glasses of water, and 5 servings of fruits and vegetables each day.   This is a list of the screening recommended for you and due dates:  Health Maintenance  Topic Date Due   DTaP/Tdap/Td vaccine  Never done   Eye exam for diabetics  02/20/2022   Medicare Annual Wellness Visit  05/07/2023   Zoster (Shingles) Vaccine (1 of 2) 06/11/2023*   Complete foot exam   05/22/2023   Hemoglobin A1C  07/22/2023   Pneumonia Vaccine  Completed   Flu Shot  Completed   HPV Vaccine  Aged Out   COVID-19 Vaccine  Discontinued  *Topic was postponed. The date shown is not the original due date.    Advanced directives: (Copy Requested) Please bring a copy of your health care power of attorney and living will to the office to be added to your chart at your convenience.  This patient declined Interactive audio and acupuncturist. Therefore the visit was completed with audio only.   Next Medicare Annual Wellness Visit scheduled for next year: Yes

## 2023-05-27 ENCOUNTER — Ambulatory Visit: Payer: Medicare HMO | Admitting: Nurse Practitioner

## 2023-05-27 ENCOUNTER — Other Ambulatory Visit: Payer: Self-pay | Admitting: Nurse Practitioner

## 2023-05-27 ENCOUNTER — Other Ambulatory Visit: Payer: Self-pay | Admitting: Urology

## 2023-05-27 DIAGNOSIS — I152 Hypertension secondary to endocrine disorders: Secondary | ICD-10-CM

## 2023-05-27 DIAGNOSIS — Z7984 Long term (current) use of oral hypoglycemic drugs: Secondary | ICD-10-CM

## 2023-05-27 DIAGNOSIS — E782 Mixed hyperlipidemia: Secondary | ICD-10-CM

## 2023-05-27 DIAGNOSIS — I10 Essential (primary) hypertension: Secondary | ICD-10-CM

## 2023-05-27 DIAGNOSIS — E1121 Type 2 diabetes mellitus with diabetic nephropathy: Secondary | ICD-10-CM

## 2023-05-27 DIAGNOSIS — R350 Frequency of micturition: Secondary | ICD-10-CM

## 2023-05-30 ENCOUNTER — Ambulatory Visit: Payer: Medicare Other | Admitting: Family Medicine

## 2023-06-27 ENCOUNTER — Other Ambulatory Visit: Payer: Self-pay | Admitting: Family Medicine

## 2023-06-27 DIAGNOSIS — I10 Essential (primary) hypertension: Secondary | ICD-10-CM

## 2023-07-14 ENCOUNTER — Other Ambulatory Visit: Payer: Self-pay | Admitting: Family Medicine

## 2023-07-21 DIAGNOSIS — N189 Chronic kidney disease, unspecified: Secondary | ICD-10-CM | POA: Diagnosis not present

## 2023-07-21 DIAGNOSIS — D631 Anemia in chronic kidney disease: Secondary | ICD-10-CM | POA: Diagnosis not present

## 2023-07-21 DIAGNOSIS — R809 Proteinuria, unspecified: Secondary | ICD-10-CM | POA: Diagnosis not present

## 2023-07-23 DIAGNOSIS — Z961 Presence of intraocular lens: Secondary | ICD-10-CM | POA: Diagnosis not present

## 2023-07-23 DIAGNOSIS — H401131 Primary open-angle glaucoma, bilateral, mild stage: Secondary | ICD-10-CM | POA: Diagnosis not present

## 2023-07-28 DIAGNOSIS — D638 Anemia in other chronic diseases classified elsewhere: Secondary | ICD-10-CM | POA: Diagnosis not present

## 2023-07-28 DIAGNOSIS — H524 Presbyopia: Secondary | ICD-10-CM | POA: Diagnosis not present

## 2023-07-28 DIAGNOSIS — N184 Chronic kidney disease, stage 4 (severe): Secondary | ICD-10-CM | POA: Diagnosis not present

## 2023-07-28 DIAGNOSIS — E1129 Type 2 diabetes mellitus with other diabetic kidney complication: Secondary | ICD-10-CM | POA: Diagnosis not present

## 2023-07-28 DIAGNOSIS — E1122 Type 2 diabetes mellitus with diabetic chronic kidney disease: Secondary | ICD-10-CM | POA: Diagnosis not present

## 2023-07-28 DIAGNOSIS — N2581 Secondary hyperparathyroidism of renal origin: Secondary | ICD-10-CM | POA: Diagnosis not present

## 2023-07-28 DIAGNOSIS — R809 Proteinuria, unspecified: Secondary | ICD-10-CM | POA: Diagnosis not present

## 2023-08-28 ENCOUNTER — Encounter: Payer: Self-pay | Admitting: Family Medicine

## 2023-08-28 ENCOUNTER — Ambulatory Visit: Admitting: Family Medicine

## 2023-08-28 ENCOUNTER — Other Ambulatory Visit: Payer: Self-pay | Admitting: Nurse Practitioner

## 2023-08-28 VITALS — BP 156/70 | HR 68 | Temp 98.1°F | Ht 72.0 in | Wt 165.0 lb

## 2023-08-28 DIAGNOSIS — Z7984 Long term (current) use of oral hypoglycemic drugs: Secondary | ICD-10-CM | POA: Diagnosis not present

## 2023-08-28 DIAGNOSIS — N183 Chronic kidney disease, stage 3 unspecified: Secondary | ICD-10-CM

## 2023-08-28 DIAGNOSIS — E1121 Type 2 diabetes mellitus with diabetic nephropathy: Secondary | ICD-10-CM | POA: Diagnosis not present

## 2023-08-28 DIAGNOSIS — E1159 Type 2 diabetes mellitus with other circulatory complications: Secondary | ICD-10-CM | POA: Diagnosis not present

## 2023-08-28 DIAGNOSIS — I152 Hypertension secondary to endocrine disorders: Secondary | ICD-10-CM

## 2023-08-28 DIAGNOSIS — E782 Mixed hyperlipidemia: Secondary | ICD-10-CM

## 2023-08-28 LAB — POCT GLYCOSYLATED HEMOGLOBIN (HGB A1C): Hemoglobin A1C: 5.8 % — AB (ref 4.0–5.6)

## 2023-08-28 MED ORDER — AMLODIPINE BESYLATE 10 MG PO TABS
10.0000 mg | ORAL_TABLET | Freq: Every day | ORAL | 3 refills | Status: AC
Start: 2023-08-28 — End: ?

## 2023-08-28 NOTE — Patient Instructions (Signed)
 It was very nice to see you today!  Your A1c today is 5.8.  We will restart your amlodipine .  Will continue other medications.  Please monitor your blood pressure at home and follow-up with me in a couple of weeks.  Will see you back in 6 months.  Please come back sooner as needed.  Return in about 6 months (around 02/28/2024) for Annual Physical.   Take care, Dr Daneil Dunker  PLEASE NOTE:  If you had any lab tests, please let us  know if you have not heard back within a few days. You may see your results on mychart before we have a chance to review them but we will give you a call once they are reviewed by us .   If we ordered any referrals today, please let us  know if you have not heard from their office within the next week.   If you had any urgent prescriptions sent in today, please check with the pharmacy within an hour of our visit to make sure the prescription was transmitted appropriately.   Please try these tips to maintain a healthy lifestyle:  Eat at least 3 REAL meals and 1-2 snacks per day.  Aim for no more than 5 hours between eating.  If you eat breakfast, please do so within one hour of getting up.   Each meal should contain half fruits/vegetables, one quarter protein, and one quarter carbs (no bigger than a computer mouse)  Cut down on sweet beverages. This includes juice, soda, and sweet tea.   Drink at least 1 glass of water with each meal and aim for at least 8 glasses per day  Exercise at least 150 minutes every week.

## 2023-08-28 NOTE — Assessment & Plan Note (Signed)
 Follows with endocrinology.  Overall doing well.  A1c today well-controlled at 5.8 on glipizide  5 mg daily per endocrinology.

## 2023-08-28 NOTE — Assessment & Plan Note (Signed)
 Not controlled today since being off amlodipine  for the last few months.  Unclear why this was stopped at his pharmacy though will refill again today.  He will also continue his valsartan 40 mg daily and metoprolol  succinate 25 mg daily.  He will monitor his blood pressure at home and follow-up with us  in a few weeks via MyChart.  We can adjust medications as needed.

## 2023-08-28 NOTE — Assessment & Plan Note (Signed)
 Following with nephrology.  Discussed importance of good blood pressure and glycemic control to prevent progression.

## 2023-08-28 NOTE — Progress Notes (Signed)
   Richard Swanson is a 89 y.o. male who presents today for an office visit.  Assessment/Plan:  Chronic Problems Addressed Today: Hypertension associated with diabetes (HCC) Not controlled today since being off amlodipine  for the last few months.  Unclear why this was stopped at his pharmacy though will refill again today.  He will also continue his valsartan 40 mg daily and metoprolol  succinate 25 mg daily.  He will monitor his blood pressure at home and follow-up with us  in a few weeks via MyChart.  We can adjust medications as needed.  Stage 3 chronic kidney disease Following with nephrology.  Discussed importance of good blood pressure and glycemic control to prevent progression.  Type 2 diabetes mellitus with diabetic nephropathy, without long-term current use of insulin (HCC) Follows with endocrinology.  Overall doing well.  A1c today well-controlled at 5.8 on glipizide  5 mg daily per endocrinology.     Subjective:  HPI:  See A/P for status of chronic conditions.  Patient is here today for follow-up.  I last saw him about 6 months ago.  Since our last visit apparently the pharmacy stopped his amlodipine .  He has been consistent with his other medications.  The last few months he has had uncontrolled blood pressure at home.  He would like to restart the amlodipine .  Feels fine today.  No acute concerns.       Objective:  Physical Exam: BP (!) 156/70   Pulse 68   Temp 98.1 F (36.7 C) (Temporal)   Ht 6' (1.829 m)   Wt 165 lb (74.8 kg)   SpO2 100%   BMI 22.38 kg/m   Gen: No acute distress, resting comfortably CV: Regular rate and rhythm with no murmurs appreciated Pulm: Normal work of breathing, clear to auscultation bilaterally with no crackles, wheezes, or rhonchi Neuro: Grossly normal, moves all extremities Psych: Normal affect and thought content      Nabeel Gladson M. Daneil Dunker, MD 08/28/2023 2:38 PM

## 2023-08-30 ENCOUNTER — Emergency Department (HOSPITAL_COMMUNITY)

## 2023-08-30 ENCOUNTER — Other Ambulatory Visit: Payer: Self-pay

## 2023-08-30 ENCOUNTER — Inpatient Hospital Stay (HOSPITAL_COMMUNITY)
Admission: EM | Admit: 2023-08-30 | Discharge: 2023-09-02 | DRG: 872 | Disposition: A | Discharge status: Home / Self Care | Attending: Internal Medicine | Admitting: Internal Medicine

## 2023-08-30 ENCOUNTER — Encounter (HOSPITAL_COMMUNITY): Payer: Self-pay | Admitting: Emergency Medicine

## 2023-08-30 DIAGNOSIS — R531 Weakness: Secondary | ICD-10-CM

## 2023-08-30 DIAGNOSIS — R509 Fever, unspecified: Principal | ICD-10-CM

## 2023-08-30 DIAGNOSIS — I700 Atherosclerosis of aorta: Secondary | ICD-10-CM | POA: Diagnosis not present

## 2023-08-30 DIAGNOSIS — D696 Thrombocytopenia, unspecified: Secondary | ICD-10-CM | POA: Diagnosis present

## 2023-08-30 DIAGNOSIS — A419 Sepsis, unspecified organism: Principal | ICD-10-CM | POA: Diagnosis present

## 2023-08-30 DIAGNOSIS — Z8546 Personal history of malignant neoplasm of prostate: Secondary | ICD-10-CM

## 2023-08-30 DIAGNOSIS — E1169 Type 2 diabetes mellitus with other specified complication: Secondary | ICD-10-CM | POA: Diagnosis present

## 2023-08-30 DIAGNOSIS — N390 Urinary tract infection, site not specified: Secondary | ICD-10-CM

## 2023-08-30 DIAGNOSIS — N1832 Chronic kidney disease, stage 3b: Secondary | ICD-10-CM | POA: Diagnosis present

## 2023-08-30 DIAGNOSIS — N3090 Cystitis, unspecified without hematuria: Secondary | ICD-10-CM | POA: Diagnosis present

## 2023-08-30 DIAGNOSIS — H409 Unspecified glaucoma: Secondary | ICD-10-CM | POA: Diagnosis present

## 2023-08-30 DIAGNOSIS — E1159 Type 2 diabetes mellitus with other circulatory complications: Secondary | ICD-10-CM | POA: Diagnosis present

## 2023-08-30 DIAGNOSIS — Z7984 Long term (current) use of oral hypoglycemic drugs: Secondary | ICD-10-CM

## 2023-08-30 DIAGNOSIS — E860 Dehydration: Secondary | ICD-10-CM | POA: Diagnosis present

## 2023-08-30 DIAGNOSIS — Z79899 Other long term (current) drug therapy: Secondary | ICD-10-CM

## 2023-08-30 DIAGNOSIS — J449 Chronic obstructive pulmonary disease, unspecified: Secondary | ICD-10-CM | POA: Diagnosis not present

## 2023-08-30 DIAGNOSIS — I152 Hypertension secondary to endocrine disorders: Secondary | ICD-10-CM | POA: Diagnosis present

## 2023-08-30 DIAGNOSIS — N183 Chronic kidney disease, stage 3 unspecified: Secondary | ICD-10-CM | POA: Diagnosis present

## 2023-08-30 DIAGNOSIS — E785 Hyperlipidemia, unspecified: Secondary | ICD-10-CM | POA: Diagnosis not present

## 2023-08-30 DIAGNOSIS — I129 Hypertensive chronic kidney disease with stage 1 through stage 4 chronic kidney disease, or unspecified chronic kidney disease: Secondary | ICD-10-CM | POA: Diagnosis not present

## 2023-08-30 DIAGNOSIS — K219 Gastro-esophageal reflux disease without esophagitis: Secondary | ICD-10-CM | POA: Diagnosis not present

## 2023-08-30 DIAGNOSIS — E872 Acidosis, unspecified: Secondary | ICD-10-CM | POA: Diagnosis not present

## 2023-08-30 DIAGNOSIS — Z1152 Encounter for screening for COVID-19: Secondary | ICD-10-CM | POA: Diagnosis not present

## 2023-08-30 DIAGNOSIS — Z823 Family history of stroke: Secondary | ICD-10-CM

## 2023-08-30 DIAGNOSIS — N189 Chronic kidney disease, unspecified: Secondary | ICD-10-CM | POA: Diagnosis not present

## 2023-08-30 DIAGNOSIS — E1151 Type 2 diabetes mellitus with diabetic peripheral angiopathy without gangrene: Secondary | ICD-10-CM | POA: Diagnosis present

## 2023-08-30 DIAGNOSIS — N179 Acute kidney failure, unspecified: Secondary | ICD-10-CM | POA: Diagnosis not present

## 2023-08-30 DIAGNOSIS — R7989 Other specified abnormal findings of blood chemistry: Secondary | ICD-10-CM

## 2023-08-30 DIAGNOSIS — Z0389 Encounter for observation for other suspected diseases and conditions ruled out: Secondary | ICD-10-CM | POA: Diagnosis not present

## 2023-08-30 DIAGNOSIS — N3281 Overactive bladder: Secondary | ICD-10-CM | POA: Diagnosis present

## 2023-08-30 DIAGNOSIS — E1122 Type 2 diabetes mellitus with diabetic chronic kidney disease: Secondary | ICD-10-CM | POA: Diagnosis not present

## 2023-08-30 DIAGNOSIS — E1121 Type 2 diabetes mellitus with diabetic nephropathy: Secondary | ICD-10-CM | POA: Diagnosis present

## 2023-08-30 DIAGNOSIS — N184 Chronic kidney disease, stage 4 (severe): Secondary | ICD-10-CM

## 2023-08-30 DIAGNOSIS — I739 Peripheral vascular disease, unspecified: Secondary | ICD-10-CM | POA: Diagnosis present

## 2023-08-30 LAB — LACTIC ACID, PLASMA
Lactic Acid, Venous: 2.2 mmol/L (ref 0.5–1.9)
Lactic Acid, Venous: 1 mmol/L (ref 0.5–1.9)

## 2023-08-30 LAB — URINALYSIS, W/ REFLEX TO CULTURE (INFECTION SUSPECTED)
Bilirubin Urine: NEGATIVE
Glucose, UA: 50 mg/dL — AB
Ketones, ur: NEGATIVE mg/dL
Nitrite: POSITIVE — AB
Protein, ur: 100 mg/dL — AB
Specific Gravity, Urine: 1.014 (ref 1.005–1.030)
WBC, UA: >50 WBC/hpf (ref 0–5)
pH: 5 (ref 5.0–8.0)

## 2023-08-30 LAB — RESP PANEL BY RT-PCR (RSV, FLU A&B, COVID)  RVPGX2
Influenza A by PCR: NEGATIVE
Influenza B by PCR: NEGATIVE
Resp Syncytial Virus by PCR: NEGATIVE
SARS Coronavirus 2 by RT PCR: NEGATIVE

## 2023-08-30 LAB — CBC WITH DIFFERENTIAL/PLATELET
Abs Immature Granulocytes: 0.06 10*3/uL (ref 0.00–0.07)
Basophils Absolute: 0 10*3/uL (ref 0.0–0.1)
Basophils Relative: 0 %
Eosinophils Absolute: 0 10*3/uL (ref 0.0–0.5)
Eosinophils Relative: 0 %
HCT: 34.8 % — ABNORMAL LOW (ref 39.0–52.0)
Hemoglobin: 11.8 g/dL — ABNORMAL LOW (ref 13.0–17.0)
Immature Granulocytes: 0 %
Lymphocytes Relative: 6 %
Lymphs Abs: 0.8 10*3/uL (ref 0.7–4.0)
MCH: 27.3 pg (ref 26.0–34.0)
MCHC: 33.9 g/dL (ref 30.0–36.0)
MCV: 80.6 fL (ref 80.0–100.0)
Monocytes Absolute: 1 10*3/uL (ref 0.1–1.0)
Monocytes Relative: 7 %
Neutro Abs: 11.6 10*3/uL — ABNORMAL HIGH (ref 1.7–7.7)
Neutrophils Relative %: 87 %
Platelets: 136 10*3/uL — ABNORMAL LOW (ref 150–400)
RBC: 4.32 MIL/uL (ref 4.22–5.81)
RDW: 14.5 % (ref 11.5–15.5)
WBC: 13.4 10*3/uL — ABNORMAL HIGH (ref 4.0–10.5)
nRBC: 0 % (ref 0.0–0.2)

## 2023-08-30 LAB — COMPREHENSIVE METABOLIC PANEL WITH GFR
ALT: 30 U/L (ref 0–44)
AST: 32 U/L (ref 15–41)
Albumin: 3.9 g/dL (ref 3.5–5.0)
Alkaline Phosphatase: 58 U/L (ref 38–126)
Anion gap: 11 (ref 5–15)
BUN: 28 mg/dL — ABNORMAL HIGH (ref 8–23)
CO2: 25 mmol/L (ref 22–32)
Calcium: 9.3 mg/dL (ref 8.9–10.3)
Chloride: 99 mmol/L (ref 98–111)
Creatinine, Ser: 2.41 mg/dL — ABNORMAL HIGH (ref 0.61–1.24)
GFR, Estimated: 25 mL/min — ABNORMAL LOW (ref >60)
Glucose, Bld: 219 mg/dL — ABNORMAL HIGH (ref 70–99)
Potassium: 3.9 mmol/L (ref 3.5–5.1)
Sodium: 135 mmol/L (ref 135–145)
Total Bilirubin: 1.3 mg/dL — ABNORMAL HIGH (ref 0.0–1.2)
Total Protein: 8.2 g/dL — ABNORMAL HIGH (ref 6.5–8.1)

## 2023-08-30 LAB — LIPASE, BLOOD: Lipase: 23 U/L (ref 11–51)

## 2023-08-30 MED ORDER — SODIUM CHLORIDE 0.9 % IV SOLN
2.0000 g | Freq: Once | INTRAVENOUS | Status: AC
  Administered 2023-08-30: 2 g via INTRAVENOUS
  Filled 2023-08-30: qty 12.5

## 2023-08-30 MED ORDER — LACTATED RINGERS IV SOLN: INTRAVENOUS | Status: DC

## 2023-08-30 MED ORDER — METRONIDAZOLE 500 MG/100ML IV SOLN
500.0000 mg | Freq: Once | INTRAVENOUS | Status: AC
  Administered 2023-08-30: 500 mg via INTRAVENOUS
  Filled 2023-08-30: qty 100

## 2023-08-30 MED ORDER — LACTATED RINGERS IV BOLUS (SEPSIS)
1000.0000 mL | Freq: Once | INTRAVENOUS | Status: AC
  Administered 2023-08-30: 1000 mL via INTRAVENOUS

## 2023-08-30 MED ORDER — VANCOMYCIN HCL IN DEXTROSE 1-5 GM/200ML-% IV SOLN
1000.0000 mg | Freq: Once | INTRAVENOUS | Status: AC
  Administered 2023-08-30: 1000 mg via INTRAVENOUS
  Filled 2023-08-30: qty 200

## 2023-08-30 MED ORDER — ACETAMINOPHEN 500 MG PO TABS
1000.0000 mg | ORAL_TABLET | Freq: Once | ORAL | Status: AC
  Administered 2023-08-30: 1000 mg via ORAL
  Filled 2023-08-30: qty 2

## 2023-08-30 NOTE — ED Triage Notes (Signed)
 Pt to ER with family who states pt has be weak, disoriented and has not been eating for 2-3 days.  Has vomited several times, but none today.  Per EMS that responded to home pt glucose was elevated in 300s.

## 2023-08-30 NOTE — H&P (Signed)
 History and Physical    Patient: Richard Swanson ZOX:[id number] DOB: 09/20/1934 DOA: 08/30/2023 DOS: the patient was seen and examined on 08/30/2023 PCP: Rodney Clamp, MD  Patient coming from: Home  Chief Complaint:  Chief Complaint  Patient presents with   Weakness   HPI: Richard Swanson is a 89 y.o. male with medical history significant of diabetes, hypertension, hyperlipidemia, stage III chronic kidney disease, history of prostate cancer.  Patient presents with 2 days of increasing weakness, decreased appetite, fever, mild confusion.  He came to the hospital for evaluation.  He was found to have a bladder infection and a fever of 103.3.  Lactic acid was 2.2.  He was given a fluid bolus and started on broad-spectrum antibiotics.  Review of Systems: As mentioned in the history of present illness. All other systems reviewed and are negative. Past Medical History:  Diagnosis Date   Cancer [facility])    Prostate   Chronic obstructive pulmonary disease, unspecified (HCC)    Diabetes mellitus without complication (HCC)    Dizziness    Gastro-esophageal reflux disease without esophagitis    Glaucoma    Glomerular disorders in diseases classified elsewhere    Gross hematuria    Hypertensive chronic kidney disease with stage 1 through stage 4 chronic kidney disease, or unspecified chronic kidney disease    Kidney disease, chronic, stage III (moderate, EGFR 30-59 ml/min) (HCC)    Malignant neoplasm of prostate (HCC)    Prostate cancer (HCC) 11/03/2010   dx 2006   Type 2 diabetes mellitus with diabetic nephropathy (HCC)    Unspecified kidney failure    Past Surgical History:  Procedure Laterality Date   PROSTATE SURGERY     Social History:  reports that he has never smoked. He has never used smokeless tobacco. He reports that he does not drink alcohol and does not use drugs.  No Known Allergies  Family History  Problem Relation Age of Onset   Stroke Father     Prior to Admission  medications   Medication Sig Start Date End Date Taking? Authorizing Provider  amLODipine  (NORVASC ) 10 MG tablet Take 1 tablet (10 mg total) by mouth daily. 08/28/23  Yes Rodney Clamp, MD  brimonidine (ALPHAGAN) 0.2 % ophthalmic solution Place 1 drop into both eyes daily. 07/23/23  Yes [provider]  glipiZIDE  (GLUCOTROL  XL) 5 MG 24 hr tablet TAKE 1 TABLET BY MOUTH DAILY WITH BREAKFAST 05/12/23  Yes Reardon, Whitney J, NP  latanoprost (XALATAN) 0.005 % ophthalmic solution Place 1 drop into both eyes at bedtime. 04/26/21  Yes [provider]  metoprolol  succinate (TOPROL -XL) 25 MG 24 hr tablet TAKE ONE TABLET BY MOUTH EVERY DAY 06/27/23  Yes Rodney Clamp, MD  rosuvastatin  (CRESTOR ) 5 MG tablet TAKE ONE TABLET BY MOUTH ONCE DAILY. 05/27/23  Yes Wendel Hals, NP  sitaGLIPtin  (JANUVIA ) 50 MG tablet Take 1 tablet (50 mg total) by mouth daily. 05/12/23  Yes Reardon, Arminda Landmark, NP  solifenacin  (VESICARE ) 10 MG tablet TAKE (1) TABLET BY MOUTH ONCE DAILY. 05/27/23  Yes Dahlstedt, Mara [place], MD  traMADol  (ULTRAM ) 50 MG tablet Take 0.5-1 tablets (25-50 mg total) by mouth every 12 (twelve) hours as needed. Patient taking differently: Take 25-50 mg by mouth every 12 (twelve) hours as needed for moderate pain (pain score 4-6). 07/14/23  Yes Rodney Clamp, MD  valsartan (DIOVAN) 40 MG tablet Take 40 mg by mouth daily. 05/24/21  Yes [provider]    Physical Exam: Vitals:  08/30/23 2000 08/30/23 2049 08/30/23 2100 08/30/23 2108  BP: (!) 130/54  136/73   Pulse: 80  77   Resp: 20  18   Temp:  98.9 F (37.2 C)  98.6 F (37 C)  TempSrc:      SpO2: 96%  98%   Weight:      Height:       General: Elderly male. Awake and alert and oriented x3. No acute cardiopulmonary distress.  HEENT: Normocephalic atraumatic.  Right and left ears normal in appearance.  Pupils equal, round, reactive to light. Extraocular muscles are intact. Sclerae anicteric and noninjected.  Moist mucosal  membranes. No mucosal lesions.  Neck: Neck supple without lymphadenopathy. No carotid bruits. No masses palpated.  Cardiovascular: Regular rate with normal S1-S2 sounds. No murmurs, rubs, gallops auscultated. No JVD.  Respiratory: Good respiratory effort with no wheezes, rales, rhonchi. Lungs clear to auscultation bilaterally.  No accessory muscle use. Abdomen: Soft, nontender, nondistended. Active bowel sounds. No masses or hepatosplenomegaly  Skin: No rashes, lesions, or ulcerations.  Dry, warm to touch. 2+ dorsalis pedis and radial pulses. Musculoskeletal: No calf or leg pain. All major joints not erythematous nontender.  No upper or lower joint deformation.  Good ROM.  No contractures  Psychiatric: Intact judgment and insight. Pleasant and cooperative. Neurologic: No focal neurological deficits. Strength is 5/5 and symmetric in upper and lower extremities.  Cranial nerves II through XII are grossly intact.  Data Reviewed: Results for orders placed or performed during the hospital encounter of 08/30/23 (from the past 24 hours)  Lactic acid, plasma     Status: Abnormal   Collection Time: 08/30/23  6:00 PM  Result Value Ref Range   Lactic Acid, Venous 2.2 (HH) 0.5 - 1.9 mmol/L  Comprehensive metabolic panel     Status: Abnormal   Collection Time: 08/30/23  6:00 PM  Result Value Ref Range   Sodium 135 135 - 145 mmol/L   Potassium 3.9 3.5 - 5.1 mmol/L   Chloride 99 98 - 111 mmol/L   CO2 25 22 - 32 mmol/L   Glucose, Bld 219 (H) 70 - 99 mg/dL   BUN 28 (H) 8 - 23 mg/dL   Creatinine, Ser 1.61 (H) 0.61 - 1.24 mg/dL   Calcium  9.3 8.9 - 10.3 mg/dL   Total Protein 8.2 (H) 6.5 - 8.1 g/dL   Albumin 3.9 3.5 - 5.0 g/dL   AST 32 15 - 41 U/L   ALT 30 0 - 44 U/L   Alkaline Phosphatase 58 38 - 126 U/L   Total Bilirubin 1.3 (H) 0.0 - 1.2 mg/dL   GFR, Estimated 25 (L) >60 mL/min   Anion gap 11 5 - 15  CBC with Differential     Status: Abnormal   Collection Time: 08/30/23  6:00 PM  Result Value Ref  Range   WBC 13.4 (H) 4.0 - 10.5 K/uL   RBC 4.32 4.22 - 5.81 MIL/uL   Hemoglobin 11.8 (L) 13.0 - 17.0 g/dL   HCT 09.6 (L) 04.5 - 40.9 %   MCV 80.6 80.0 - 100.0 fL   MCH 27.3 26.0 - 34.0 pg   MCHC 33.9 30.0 - 36.0 g/dL   RDW 81.1 91.4 - 78.2 %   Platelets 136 (L) 150 - 400 K/uL   nRBC 0.0 0.0 - 0.2 %   Neutrophils Relative % 87 %   Neutro Abs 11.6 (H) 1.7 - 7.7 K/uL   Lymphocytes Relative 6 %   Lymphs Abs 0.8 0.7 -  4.0 K/uL   Monocytes Relative 7 %   Monocytes Absolute 1.0 0.1 - 1.0 K/uL   Eosinophils Relative 0 %   Eosinophils Absolute 0.0 0.0 - 0.5 K/uL   Basophils Relative 0 %   Basophils Absolute 0.0 0.0 - 0.1 K/uL   Immature Granulocytes 0 %   Abs Immature Granulocytes 0.06 0.00 - 0.07 K/uL  Blood Culture (routine x 2)     Status: None (Preliminary result)   Collection Time: 08/30/23  6:00 PM   Specimen: Vein; Blood  Result Value Ref Range   Specimen Description      RIGHT ANTECUBITAL BOTTLES DRAWN AEROBIC AND ANAEROBIC   Special Requests      Blood Culture adequate volume Performed at [facility], [address], [place], [place] [address]    Culture PENDING    Report Status PENDING   Lipase, blood     Status: None   Collection Time: 08/30/23  6:00 PM  Result Value Ref Range   Lipase 23 11 - 51 U/L  Blood Culture (routine x 2)     Status: None (Preliminary result)   Collection Time: 08/30/23  6:11 PM   Specimen: Vein; Blood  Result Value Ref Range   Specimen Description      BLOOD RIGHT ARM BOTTLES DRAWN AEROBIC AND ANAEROBIC   Special Requests      Blood Culture adequate volume Performed at [facility], [address] [place], [place] [address]    Culture PENDING    Report Status PENDING   Resp panel by RT-PCR (RSV, Flu A&B, Covid) Anterior Nasal Swab     Status: None   Collection Time: 08/30/23  6:13 PM   Specimen: Anterior Nasal Swab  Result Value Ref Range   SARS Coronavirus 2 by RT PCR NEGATIVE NEGATIVE   Influenza A by PCR NEGATIVE NEGATIVE    Influenza B by PCR NEGATIVE NEGATIVE   Resp Syncytial Virus by PCR NEGATIVE NEGATIVE  Urinalysis, w/ Reflex to Culture (Infection Suspected) -Urine, Clean Catch     Status: Abnormal   Collection Time: 08/30/23  7:40 PM  Result Value Ref Range   Specimen Source URINE, CLEAN CATCH    Color, Urine YELLOW YELLOW   APPearance CLOUDY (A) CLEAR   Specific Gravity, Urine 1.014 1.005 - 1.030   pH 5.0 5.0 - 8.0   Glucose, UA 50 (A) NEGATIVE mg/dL   Hgb urine dipstick MODERATE (A) NEGATIVE   Bilirubin Urine NEGATIVE NEGATIVE   Ketones, ur NEGATIVE NEGATIVE mg/dL   Protein, ur 098 (A) NEGATIVE mg/dL   Nitrite POSITIVE (A) NEGATIVE   Leukocytes,Ua LARGE (A) NEGATIVE   RBC / HPF 21-50 0 - 5 RBC/hpf   WBC, UA >50 0 - 5 WBC/hpf   Bacteria, UA FEW (A) NONE SEEN   Squamous Epithelial / HPF 0-5 0 - 5 /HPF   WBC Clumps PRESENT    Mucus PRESENT   Lactic acid, plasma     Status: None   Collection Time: 08/30/23  8:25 PM  Result Value Ref Range   Lactic Acid, Venous 1.0 0.5 - 1.9 mmol/L    DG Chest Port 1 View Result Date: 08/30/2023 CLINICAL DATA:  Questionable sepsis. EXAM: PORTABLE CHEST 1 VIEW COMPARISON:  Chest radiograph dated 08/30/2008. FINDINGS: Shallow inspiration. No focal consolidation, pleural effusion or pneumothorax. Stable cardiac silhouette. Atherosclerotic calcification of the aorta. No acute osseous pathology. IMPRESSION: No active disease. Electronically Signed   By: Angus Bark M.D.   On: 08/30/2023 18:32  Assessment and Plan: No notes have been filed under this hospital service. Service: Hospitalist  Principal Problem:   Sepsis secondary to UTI [facility]) Active Problems:   GERD (gastroesophageal reflux disease)   Stage 3 chronic kidney disease (HCC)   Type 2 diabetes mellitus with diabetic nephropathy, without long-term current use of insulin (HCC)   Hypertension associated with diabetes (HCC)   Dyslipidemia due to type 2 diabetes mellitus (HCC)   PAD (peripheral artery  disease) (HCC)  Sepsis secondary to UTI Will transition patient to Rocephin Urine culture and blood cultures pending Repeat lactic acid Will finish patient's IV fluid bolus of 30 mg/kg Maintenance IV fluids Check CBC in the morning Stage III chronic kidney disease Check creatinine in the morning Type 2 diabetes CBGs AC and nightly Sliding scale insulin Hypertension Continue blood pressure medications Peripheral artery disease   Advance Care Planning:   Code Status: Full Code confirmed by patient  Consults: None  Family Communication: Wife and daughter present  Severity of Illness: The appropriate patient status for this patient is INPATIENT. Inpatient status is judged to be reasonable and necessary in order to provide the required intensity of service to ensure the patient's safety. The patient's presenting symptoms, physical exam findings, and initial radiographic and laboratory data in the context of their chronic comorbidities is felt to place them at high risk for further clinical deterioration. Furthermore, it is not anticipated that the patient will be medically stable for discharge from the hospital within 2 midnights of admission.   * I certify that at the point of admission it is my clinical judgment that the patient will require inpatient hospital care spanning beyond 2 midnights from the point of admission due to high intensity of service, high risk for further deterioration and high frequency of surveillance required.*  Author: Tyleah Loh J Shed Nixon, DO 08/30/2023 9:17 PM  For on call review [email or web address].

## 2023-08-30 NOTE — ED Notes (Signed)
 Patient and family aware UA is needed Urinal at bedside.

## 2023-08-30 NOTE — Sepsis Progress Note (Signed)
 Elink following code sepsis

## 2023-08-30 NOTE — ED Provider Notes (Signed)
 [place] EMERGENCY DEPARTMENT AT [facility] Provider Note   CSN: [id number] Arrival date & time: 08/30/23  1717     History  Chief Complaint  Patient presents with   Weakness    Richard Swanson is a 89 y.o. male.  Pt with onset fever today, family indicates seems generally weak. Hx mild confusion, memory issues at baseline - pt is limited historian - level 5 caveat. Pt denies specific pain or other complaint. Yesterday did have episodes nausea/vomiting. Not bloody or bilious. No severe diarrhea. No current abd pain or distension. No sore throat, cough or uri symptoms. No headache. No neck/back pain. No chest pain. No extremity pain, swelling, injury, redness, or rash. No known ill contacts.   The history is provided by the patient, medical records and a relative.  Weakness Associated symptoms: fever, nausea and vomiting   Associated symptoms: no abdominal pain, no chest pain, no cough, no dysuria, no frequency, no headaches and no shortness of breath        Home Medications Prior to Admission medications   Medication Sig Start Date End Date Taking? Authorizing Provider  amLODipine  (NORVASC ) 10 MG tablet Take 1 tablet (10 mg total) by mouth daily. 08/28/23  Yes Rodney Clamp, MD  brimonidine (ALPHAGAN) 0.2 % ophthalmic solution Place 1 drop into both eyes daily. 07/23/23  Yes [provider]  glipiZIDE  (GLUCOTROL  XL) 5 MG 24 hr tablet TAKE 1 TABLET BY MOUTH DAILY WITH BREAKFAST 05/12/23  Yes Reardon, Whitney J, NP  latanoprost (XALATAN) 0.005 % ophthalmic solution Place 1 drop into both eyes at bedtime. 04/26/21  Yes [provider]  metoprolol  succinate (TOPROL -XL) 25 MG 24 hr tablet TAKE ONE TABLET BY MOUTH EVERY DAY 06/27/23  Yes Rodney Clamp, MD  rosuvastatin  (CRESTOR ) 5 MG tablet TAKE ONE TABLET BY MOUTH ONCE DAILY. 05/27/23  Yes Wendel Hals, NP  sitaGLIPtin  (JANUVIA ) 50 MG tablet Take 1 tablet (50 mg total) by mouth daily. 05/12/23  Yes  Reardon, Arminda Landmark, NP  solifenacin  (VESICARE ) 10 MG tablet TAKE (1) TABLET BY MOUTH ONCE DAILY. 05/27/23  Yes Dahlstedt, Mara Seminole, MD  traMADol  (ULTRAM ) 50 MG tablet Take 0.5-1 tablets (25-50 mg total) by mouth every 12 (twelve) hours as needed. Patient taking differently: Take 25-50 mg by mouth every 12 (twelve) hours as needed for moderate pain (pain score 4-6). 07/14/23  Yes Rodney Clamp, MD  valsartan (DIOVAN) 40 MG tablet Take 40 mg by mouth daily. 05/24/21  Yes [provider]      Allergies    Patient has no known allergies.    Review of Systems   Review of Systems  Constitutional:  Positive for fever.  HENT:  Negative for sore throat.   Eyes:  Negative for photophobia, pain and redness.  Respiratory:  Negative for cough and shortness of breath.   Cardiovascular:  Negative for chest pain and leg swelling.  Gastrointestinal:  Positive for nausea and vomiting. Negative for abdominal pain and constipation.  Genitourinary:  Negative for dysuria, flank pain and frequency.  Musculoskeletal:  Negative for back pain, neck pain and neck stiffness.  Skin:  Negative for rash.  Neurological:  Positive for weakness. Negative for speech difficulty, numbness and headaches.    Physical Exam Updated Vital Signs BP (!) 130/54   Pulse 80   Temp (!) 102.1 F (38.9 C) (Oral)   Resp 20   Ht 1.829 m (6')   Wt 73.5 kg   SpO2 96%  BMI 21.97 kg/m  Physical Exam Vitals and nursing note reviewed.  Constitutional:      Appearance: Normal appearance. He is well-developed.  HENT:     Head: Atraumatic.     Comments: No sinus, mastoid, or temporal tenderness.     Right Ear: Tympanic membrane and ear canal normal.     Left Ear: Tympanic membrane and ear canal normal.     Nose: Nose normal.     Mouth/Throat:     Mouth: Mucous membranes are moist.     Pharynx: Oropharynx is clear. No oropharyngeal exudate or posterior oropharyngeal erythema.  Eyes:     General: No scleral icterus.     Conjunctiva/sclera: Conjunctivae normal.     Pupils: Pupils are equal, round, and reactive to light.  Neck:     Vascular: No carotid bruit.     Trachea: No tracheal deviation.     Comments: Trachea midline. Thyroid  not grossly enlarged or tender. No neck stiffness or rigidity.  Cardiovascular:     Rate and Rhythm: Normal rate and regular rhythm.     Pulses: Normal pulses.     Heart sounds: Normal heart sounds. No murmur heard.    No friction rub. No gallop.  Pulmonary:     Effort: Pulmonary effort is normal. No accessory muscle usage or respiratory distress.     Breath sounds: Normal breath sounds.  Abdominal:     General: Bowel sounds are normal. There is no distension.     Palpations: Abdomen is soft. There is no mass.     Tenderness: There is no abdominal tenderness. There is no guarding.  Genitourinary:    Comments: No cva tenderness. Musculoskeletal:        General: No swelling.     Cervical back: Normal range of motion and neck supple. No rigidity or tenderness.     Comments: CTLS spine, non tender, aligned, no step off. Good rom bil extremities without pain or focal tenderness. No extremity swelling, redness, or sign of infection.   Lymphadenopathy:     Cervical: No cervical adenopathy.  Skin:    General: Skin is warm and dry.     Findings: No rash.  Neurological:     Mental Status: He is alert.     Comments: Alert, speech clear. Motor/sens grossly intact bil.   Psychiatric:        Mood and Affect: Mood normal.     ED Results / Procedures / Treatments   Labs (all labs ordered are listed, but only abnormal results are displayed) Results for orders placed or performed during the hospital encounter of 08/30/23  Blood Culture (routine x 2)   Collection Time: 08/30/23  6:00 PM   Specimen: Vein; Blood  Result Value Ref Range   Specimen Description      RIGHT ANTECUBITAL BOTTLES DRAWN AEROBIC AND ANAEROBIC   Special Requests      Blood Culture adequate  volume Performed at [facility], [address], [place], [place] [address]    Culture PENDING    Report Status PENDING   Lactic acid, plasma   Collection Time: 08/30/23  6:00 PM  Result Value Ref Range   Lactic Acid, Venous 2.2 (HH) 0.5 - 1.9 mmol/L  Comprehensive metabolic panel   Collection Time: 08/30/23  6:00 PM  Result Value Ref Range   Sodium 135 135 - 145 mmol/L   Potassium 3.9 3.5 - 5.1 mmol/L   Chloride 99 98 - 111 mmol/L   CO2 25 22 -  32 mmol/L   Glucose, Bld 219 (H) 70 - 99 mg/dL   BUN 28 (H) 8 - 23 mg/dL   Creatinine, Ser 8.29 (H) 0.61 - 1.24 mg/dL   Calcium  9.3 8.9 - 10.3 mg/dL   Total Protein 8.2 (H) 6.5 - 8.1 g/dL   Albumin 3.9 3.5 - 5.0 g/dL   AST 32 15 - 41 U/L   ALT 30 0 - 44 U/L   Alkaline Phosphatase 58 38 - 126 U/L   Total Bilirubin 1.3 (H) 0.0 - 1.2 mg/dL   GFR, Estimated 25 (L) >60 mL/min   Anion gap 11 5 - 15  CBC with Differential   Collection Time: 08/30/23  6:00 PM  Result Value Ref Range   WBC 13.4 (H) 4.0 - 10.5 K/uL   RBC 4.32 4.22 - 5.81 MIL/uL   Hemoglobin 11.8 (L) 13.0 - 17.0 g/dL   HCT 56.2 (L) 13.0 - 86.5 %   MCV 80.6 80.0 - 100.0 fL   MCH 27.3 26.0 - 34.0 pg   MCHC 33.9 30.0 - 36.0 g/dL   RDW 78.4 69.6 - 29.5 %   Platelets 136 (L) 150 - 400 K/uL   nRBC 0.0 0.0 - 0.2 %   Neutrophils Relative % 87 %   Neutro Abs 11.6 (H) 1.7 - 7.7 K/uL   Lymphocytes Relative 6 %   Lymphs Abs 0.8 0.7 - 4.0 K/uL   Monocytes Relative 7 %   Monocytes Absolute 1.0 0.1 - 1.0 K/uL   Eosinophils Relative 0 %   Eosinophils Absolute 0.0 0.0 - 0.5 K/uL   Basophils Relative 0 %   Basophils Absolute 0.0 0.0 - 0.1 K/uL   Immature Granulocytes 0 %   Abs Immature Granulocytes 0.06 0.00 - 0.07 K/uL  Lipase, blood   Collection Time: 08/30/23  6:00 PM  Result Value Ref Range   Lipase 23 11 - 51 U/L  Blood Culture (routine x 2)   Collection Time: 08/30/23  6:11 PM   Specimen: Vein; Blood  Result Value Ref Range   Specimen Description      BLOOD RIGHT ARM  BOTTLES DRAWN AEROBIC AND ANAEROBIC   Special Requests      Blood Culture adequate volume Performed at [facility], [address] [place], [place] [address]    Culture PENDING    Report Status PENDING   Resp panel by RT-PCR (RSV, Flu A&B, Covid) Anterior Nasal Swab   Collection Time: 08/30/23  6:13 PM   Specimen: Anterior Nasal Swab  Result Value Ref Range   SARS Coronavirus 2 by RT PCR NEGATIVE NEGATIVE   Influenza A by PCR NEGATIVE NEGATIVE   Influenza B by PCR NEGATIVE NEGATIVE   Resp Syncytial Virus by PCR NEGATIVE NEGATIVE  Urinalysis, w/ Reflex to Culture (Infection Suspected) -Urine, Clean Catch   Collection Time: 08/30/23  7:40 PM  Result Value Ref Range   Specimen Source URINE, CLEAN CATCH    Color, Urine YELLOW YELLOW   APPearance CLOUDY (A) CLEAR   Specific Gravity, Urine 1.014 1.005 - 1.030   pH 5.0 5.0 - 8.0   Glucose, UA 50 (A) NEGATIVE mg/dL   Hgb urine dipstick MODERATE (A) NEGATIVE   Bilirubin Urine NEGATIVE NEGATIVE   Ketones, ur NEGATIVE NEGATIVE mg/dL   Protein, ur 244 (A) NEGATIVE mg/dL   Nitrite POSITIVE (A) NEGATIVE   Leukocytes,Ua LARGE (A) NEGATIVE   RBC / HPF 21-50 0 - 5 RBC/hpf   WBC, UA >50 0 - 5 WBC/hpf  Bacteria, UA FEW (A) NONE SEEN   Squamous Epithelial / HPF 0-5 0 - 5 /HPF   WBC Clumps PRESENT    Mucus PRESENT    DG Chest Port 1 View Result Date: 08/30/2023 CLINICAL DATA:  Questionable sepsis. EXAM: PORTABLE CHEST 1 VIEW COMPARISON:  Chest radiograph dated 08/30/2008. FINDINGS: Shallow inspiration. No focal consolidation, pleural effusion or pneumothorax. Stable cardiac silhouette. Atherosclerotic calcification of the aorta. No acute osseous pathology. IMPRESSION: No active disease. Electronically Signed   By: Angus Bark M.D.   On: 08/30/2023 18:32     EKG EKG Interpretation Date/Time:  Saturday Aug 30 2023 18:20:35 EDT Ventricular Rate:  81 PR Interval:  220 QRS Duration:  108 QT Interval:  350 QTC Calculation: 407 R  Axis:   -56  Text Interpretation: Sinus rhythm Premature atrial complexes Prolonged PR interval Left anterior fascicular block LVH with secondary repolarization abnormality Confirmed by Guadalupe Lee ([id number]) on 08/30/2023 6:40:06 PM  Radiology DG Chest Port 1 View Result Date: 08/30/2023 CLINICAL DATA:  Questionable sepsis. EXAM: PORTABLE CHEST 1 VIEW COMPARISON:  Chest radiograph dated 08/30/2008. FINDINGS: Shallow inspiration. No focal consolidation, pleural effusion or pneumothorax. Stable cardiac silhouette. Atherosclerotic calcification of the aorta. No acute osseous pathology. IMPRESSION: No active disease. Electronically Signed   By: Angus Bark M.D.   On: 08/30/2023 18:32    Procedures Procedures    Medications Ordered in ED Medications  lactated ringers infusion (has no administration in time range)  metroNIDAZOLE (FLAGYL) IVPB 500 mg (500 mg Intravenous New Bag/Given 08/30/23 1937)  vancomycin (VANCOCIN) IVPB 1000 mg/200 mL premix (has no administration in time range)  acetaminophen (TYLENOL) tablet 1,000 mg (1,000 mg Oral Given 08/30/23 1833)  lactated ringers bolus 1,000 mL (1,000 mLs Intravenous New Bag/Given 08/30/23 1900)  ceFEPIme (MAXIPIME) 2 g in sodium chloride 0.9 % 100 mL IVPB (2 g Intravenous New Bag/Given 08/30/23 1900)    ED Course/ Medical Decision Making/ A&P                                 Medical Decision Making Problems Addressed: Acute febrile illness: acute illness or injury with systemic symptoms that poses a threat to life or bodily functions Acute UTI: acute illness or injury with systemic symptoms that poses a threat to life or bodily functions    Details: R/o urosepsis Elevated lactic acid level: acute illness or injury with systemic symptoms that poses a threat to life or bodily functions Generalized weakness: acute illness or injury with systemic symptoms that poses a threat to life or bodily functions Stage 4 chronic kidney disease (HCC): chronic  illness or injury with exacerbation, progression, or side effects of treatment that poses a threat to life or bodily functions Thrombocytopenia (HCC): chronic illness or injury  Amount and/or Complexity of Data Reviewed Independent Historian:     Details: Family, hx External Data Reviewed: notes. Labs: ordered. Decision-making details documented in ED Course. Radiology: ordered and independent interpretation performed. Decision-making details documented in ED Course. ECG/medicine tests: ordered and independent interpretation performed. Decision-making details documented in ED Course. Discussion of management or test interpretation with external provider(s): medicine  Risk OTC drugs. Prescription drug management. Decision regarding hospitalization.   Iv ns. Continuous pulse ox and cardiac monitoring. Labs ordered/sent. Imaging ordered.   Differential diagnosis includes fever, covid, flu, pna, uti, etc. Dispo decision including potential need for admission considered - will get labs and imaging and reassess.  Reviewed nursing notes and prior charts for additional history. External reports reviewed. Additional history from: family.   LR bolus. Cultures sent. Iv  abx given.   Cardiac monitor: sinus rhythm, rate 66.  Labs reviewed/interpreted by me - wbc 13, hgb 12.   Xrays reviewed/interpreted by me - no pna.   Additional labs reviewed/interpreted by me - ua c/w uti. Culture sent. Iv abx already given.   Hospitalists consulted for admission.   CRITICAL CARE  RE: urosepsis, elevated lactate, gen weakness, acute febrile illness.  Performed by: Tinlee Navarrette E Braxden Lovering Total critical care time: 40 minutes Critical care time was exclusive of separately billable procedures and treating other patients. Critical care was necessary to treat or prevent imminent or life-threatening deterioration. Critical care was time spent personally by me on the following activities: development of treatment  plan with patient and/or surrogate as well as nursing, discussions with consultants, evaluation of patient's response to treatment, examination of patient, obtaining history from patient or surrogate, ordering and performing treatments and interventions, ordering and review of laboratory studies, ordering and review of radiographic studies, pulse oximetry and re-evaluation of patient's condition.        Final Clinical Impression(s) / ED Diagnoses Final diagnoses:  Acute febrile illness  Acute UTI  Generalized weakness  Elevated lactic acid level  Stage 4 chronic kidney disease (HCC)  Thrombocytopenia (HCC)    Rx / DC Orders ED Discharge Orders     None         Guadalupe Lee, MD 08/30/23 2030

## 2023-08-30 NOTE — Progress Notes (Signed)
 CODE SEPSIS - PHARMACY COMMUNICATION  **Broad Spectrum Antibiotics should be administered within 1 hour of Sepsis diagnosis**  Time Code Sepsis Called/Page Received: 1842  Antibiotics Ordered: cefepime, vancomycin, metronidazole  Time of 1st antibiotic administration: Cefepime at 1900  Additional action taken by pharmacy: none  If necessary, Name of Provider/Nurse Contacted: n/a    Cerinity Zynda Rodriguez-Guzman PharmD, BCPS 08/30/2023 6:54 PM

## 2023-08-31 DIAGNOSIS — E785 Hyperlipidemia, unspecified: Secondary | ICD-10-CM

## 2023-08-31 DIAGNOSIS — I152 Hypertension secondary to endocrine disorders: Secondary | ICD-10-CM

## 2023-08-31 DIAGNOSIS — E1169 Type 2 diabetes mellitus with other specified complication: Secondary | ICD-10-CM | POA: Diagnosis not present

## 2023-08-31 DIAGNOSIS — N179 Acute kidney failure, unspecified: Secondary | ICD-10-CM

## 2023-08-31 DIAGNOSIS — N390 Urinary tract infection, site not specified: Secondary | ICD-10-CM

## 2023-08-31 DIAGNOSIS — N1832 Chronic kidney disease, stage 3b: Secondary | ICD-10-CM

## 2023-08-31 DIAGNOSIS — A419 Sepsis, unspecified organism: Secondary | ICD-10-CM | POA: Diagnosis not present

## 2023-08-31 DIAGNOSIS — K219 Gastro-esophageal reflux disease without esophagitis: Secondary | ICD-10-CM | POA: Diagnosis not present

## 2023-08-31 DIAGNOSIS — E1121 Type 2 diabetes mellitus with diabetic nephropathy: Secondary | ICD-10-CM

## 2023-08-31 DIAGNOSIS — E1159 Type 2 diabetes mellitus with other circulatory complications: Secondary | ICD-10-CM

## 2023-08-31 LAB — CBC
HCT: 29.8 % — ABNORMAL LOW (ref 39.0–52.0)
Hemoglobin: 10.1 g/dL — ABNORMAL LOW (ref 13.0–17.0)
MCH: 27.4 pg (ref 26.0–34.0)
MCHC: 33.9 g/dL (ref 30.0–36.0)
MCV: 81 fL (ref 80.0–100.0)
Platelets: 111 10*3/uL — ABNORMAL LOW (ref 150–400)
RBC: 3.68 MIL/uL — ABNORMAL LOW (ref 4.22–5.81)
RDW: 14.6 % (ref 11.5–15.5)
WBC: 10.9 10*3/uL — ABNORMAL HIGH (ref 4.0–10.5)
nRBC: 0 % (ref 0.0–0.2)

## 2023-08-31 LAB — GLUCOSE, CAPILLARY
Glucose-Capillary: 182 mg/dL — ABNORMAL HIGH (ref 70–99)
Glucose-Capillary: 160 mg/dL — ABNORMAL HIGH (ref 70–99)
Glucose-Capillary: 119 mg/dL — ABNORMAL HIGH (ref 70–99)
Glucose-Capillary: 135 mg/dL — ABNORMAL HIGH (ref 70–99)

## 2023-08-31 LAB — BLOOD CULTURE ID PANEL (REFLEXED) - BCID2

## 2023-08-31 LAB — BASIC METABOLIC PANEL WITH GFR
Anion gap: 9 (ref 5–15)
BUN: 24 mg/dL — ABNORMAL HIGH (ref 8–23)
CO2: 25 mmol/L (ref 22–32)
Calcium: 8.4 mg/dL — ABNORMAL LOW (ref 8.9–10.3)
Chloride: 99 mmol/L (ref 98–111)
Creatinine, Ser: 1.89 mg/dL — ABNORMAL HIGH (ref 0.61–1.24)
GFR, Estimated: 34 mL/min — ABNORMAL LOW (ref >60)
Glucose, Bld: 181 mg/dL — ABNORMAL HIGH (ref 70–99)
Potassium: 3.8 mmol/L (ref 3.5–5.1)
Sodium: 133 mmol/L — ABNORMAL LOW (ref 135–145)

## 2023-08-31 MED ORDER — ACETAMINOPHEN 650 MG RE SUPP: 650.0000 mg | Freq: Four times a day (QID) | RECTAL | Status: DC | PRN

## 2023-08-31 MED ORDER — LATANOPROST 0.005 % OP SOLN
1.0000 [drp] | Freq: Every day | OPHTHALMIC | Status: DC
  Administered 2023-08-31 – 2023-09-01 (×2): 1 [drp] via OPHTHALMIC
  Filled 2023-08-31: qty 2.5

## 2023-08-31 MED ORDER — IRBESARTAN 75 MG PO TABS
75.0000 mg | ORAL_TABLET | Freq: Every day | ORAL | Status: DC
  Administered 2023-08-31: 75 mg via ORAL
  Filled 2023-08-31: qty 1

## 2023-08-31 MED ORDER — INSULIN ASPART 100 UNIT/ML IJ SOLN
0.0000 [IU] | Freq: Three times a day (TID) | INTRAMUSCULAR | Status: DC
  Administered 2023-08-31 (×2): 3 [IU] via SUBCUTANEOUS
  Administered 2023-09-01: 5 [IU] via SUBCUTANEOUS
  Administered 2023-09-01: 2 [IU] via SUBCUTANEOUS
  Administered 2023-09-01 – 2023-09-02 (×2): 3 [IU] via SUBCUTANEOUS

## 2023-08-31 MED ORDER — TRAMADOL HCL 50 MG PO TABS: 25.0000 mg | ORAL_TABLET | Freq: Two times a day (BID) | ORAL | Status: DC | PRN

## 2023-08-31 MED ORDER — ONDANSETRON HCL 4 MG/2ML IJ SOLN
4.0000 mg | Freq: Four times a day (QID) | INTRAMUSCULAR | Status: DC | PRN
Start: 2023-08-31 — End: 2023-09-02

## 2023-08-31 MED ORDER — SODIUM CHLORIDE 0.9 % IV SOLN: INTRAVENOUS | Status: DC

## 2023-08-31 MED ORDER — ROSUVASTATIN CALCIUM 10 MG PO TABS
5.0000 mg | ORAL_TABLET | Freq: Every day | ORAL | Status: DC
  Administered 2023-08-31 – 2023-09-02 (×3): 5 mg via ORAL
  Filled 2023-08-31 (×3): qty 1

## 2023-08-31 MED ORDER — ENOXAPARIN SODIUM 30 MG/0.3ML IJ SOSY
30.0000 mg | PREFILLED_SYRINGE | INTRAMUSCULAR | Status: DC
  Administered 2023-08-31 – 2023-09-02 (×3): 30 mg via SUBCUTANEOUS
  Filled 2023-08-31 (×3): qty 0.3

## 2023-08-31 MED ORDER — AMLODIPINE BESYLATE 5 MG PO TABS
10.0000 mg | ORAL_TABLET | Freq: Every day | ORAL | Status: DC
  Administered 2023-08-31: 10 mg via ORAL
  Filled 2023-08-31 (×3): qty 2

## 2023-08-31 MED ORDER — FESOTERODINE FUMARATE ER 4 MG PO TB24
4.0000 mg | ORAL_TABLET | Freq: Every day | ORAL | Status: DC
  Administered 2023-08-31 – 2023-09-02 (×3): 4 mg via ORAL
  Filled 2023-08-31 (×3): qty 1

## 2023-08-31 MED ORDER — BRIMONIDINE TARTRATE 0.2 % OP SOLN
1.0000 [drp] | Freq: Every day | OPHTHALMIC | Status: DC
  Administered 2023-08-31 – 2023-09-02 (×3): 1 [drp] via OPHTHALMIC
  Filled 2023-08-31: qty 5

## 2023-08-31 MED ORDER — SODIUM CHLORIDE 0.9 % IV SOLN
1.0000 g | INTRAVENOUS | Status: DC
  Administered 2023-08-31: 1 g via INTRAVENOUS
  Filled 2023-08-31: qty 10

## 2023-08-31 MED ORDER — ACETAMINOPHEN 325 MG PO TABS
650.0000 mg | ORAL_TABLET | Freq: Four times a day (QID) | ORAL | Status: DC | PRN
  Administered 2023-08-31 (×2): 650 mg via ORAL
  Filled 2023-08-31 (×2): qty 2

## 2023-08-31 MED ORDER — METOPROLOL SUCCINATE ER 25 MG PO TB24
25.0000 mg | ORAL_TABLET | Freq: Every day | ORAL | Status: DC
  Administered 2023-08-31 – 2023-09-02 (×3): 25 mg via ORAL
  Filled 2023-08-31 (×3): qty 1

## 2023-08-31 MED ORDER — HYDRALAZINE HCL 20 MG/ML IJ SOLN
10.0000 mg | Freq: Four times a day (QID) | INTRAMUSCULAR | Status: DC | PRN
  Administered 2023-08-31: 10 mg via INTRAVENOUS
  Filled 2023-08-31: qty 1

## 2023-08-31 MED ORDER — INSULIN ASPART 100 UNIT/ML IJ SOLN: 0.0000 [IU] | Freq: Every day | INTRAMUSCULAR | Status: DC

## 2023-08-31 MED ORDER — ONDANSETRON HCL 4 MG PO TABS: 4.0000 mg | ORAL_TABLET | Freq: Four times a day (QID) | ORAL | Status: DC | PRN

## 2023-08-31 MED ORDER — SODIUM CHLORIDE 0.9 % IV SOLN
2.0000 g | INTRAVENOUS | Status: DC
  Administered 2023-09-01 – 2023-09-02 (×2): 2 g via INTRAVENOUS
  Filled 2023-08-31 (×2): qty 20

## 2023-08-31 NOTE — Progress Notes (Signed)
 Patient given tylenol for fever. Family remains at bedside.

## 2023-08-31 NOTE — Progress Notes (Addendum)
  Progress Note   Patient: Richard Swanson ZOX:[id number] DOB: 01-27-1935 DOA: 08/30/2023     1 DOS: the patient was seen and examined on 08/31/2023   Brief hospital admission narrative: As per H&P written by Dr. Cathyann Cobia on 08/30/2023 Mackinley Bullen is a 89 y.o. male with medical history significant of diabetes, hypertension, hyperlipidemia, stage III chronic kidney disease, history of prostate cancer.  Patient presents with 2 days of increasing weakness, decreased appetite, fever, mild confusion.  He came to the hospital for evaluation.  He was found to have a bladder infection and a fever of 103.3.  Lactic acid was 2.2.  He was given a fluid bolus and started on broad-spectrum antibiotics.   Assessment and plan 1-sepsis secondary to UTI - Continue to maintain adequate hydration and continue IV antibiotics - Still spiking fever (fever curve improving). - Continue as needed antipyretics and supportive care. - Patient overall expressing feeling better.  2-acute kidney injury on stage III b chronic kidney disease -In the setting of dehydration and UTI - Continue to maintain adequate hydration - Minimize nephrotoxic agents - Follow renal function trend - Creatinine adequately trending down  3-type 2 diabetes with nephropathy - Continue to follow CBG fluctuation and adjust hypoglycemic regimen as needed - Continue sliding scale insulin. - Continue holding oral hypoglycemic agents while inpatient.  4-hypertension - Continue current antihypertensive agent (amlodipine , metoprolol  and as needed hydralazine); holding home dose avapro until renal function further stabilizes  - Heart healthy diet discussed with patient.  5-hyperlipidemia - Continue statin.  6-history of glaucoma - Continue Xalatan  7-GERD - Continue PPI.  Subjective:  Reports feeling better; no chest pain, no nausea or vomiting.  Still spiking fever  Physical Exam: Vitals:   08/31/23 0519 08/31/23 0649 08/31/23 1118  08/31/23 1502  BP: (!) 173/64 (!) 128/48 (!) 120/59 (!) 156/65  Pulse: (!) 102 94 83 98  Resp: 18 16 19 19   Temp: (!) 102.8 F (39.3 C) (!) 100.4 F (38 C) 99.7 F (37.6 C) (!) 101.6 F (38.7 C)  TempSrc: Oral Oral Oral Oral  SpO2: 97% 98% 99% 99%  Weight:      Height:       General exam: Alert, awake, oriented x 3 Respiratory system: Clear to auscultation. Respiratory effort normal. Cardiovascular system:RRR. No murmurs, rubs, gallops. Gastrointestinal system: Abdomen is nondistended, soft and nontender. No organomegaly or masses felt. Normal bowel sounds heard. Central nervous system: Alert and oriented. No focal neurological deficits. Extremities: No C/C/E, +pedal pulses Skin: No rashes, lesions or ulcers Psychiatry: Judgement and insight appear normal. Mood & affect appropriate.    Data Reviewed: Basic metabolic panel: Sodium 133, potassium 3.8, chloride 99, bicarb 25, BUN 24, creatinine 1.89, GFR 34 CBC: WBCs 10.9, hemoglobin 10.1 and platelet count 111K  Family Communication: Wife and grandson at bedside.  Disposition: Status is: Inpatient Remains inpatient appropriate because: Continue IV antibiotics.  Anticipating discharge back home once medically stable.  Time spent: 50 minutes  Author: Justina Oman, MD 08/31/2023 6:00 PM  For on call review [email or web address].

## 2023-08-31 NOTE — TOC Initial Note (Signed)
 Transition of Care [facility]) - Initial/Assessment Note    Patient Details  Name: Richard Swanson MRN: [id number] Date of Birth: 02-13-1935  [facility] CM/SW Contact:    Lynda Sands, RN Phone Number: 08/31/2023, 7:02 PM  Clinical Narrative:   CM met with patient and spouse at bedside.  Patient admitted with Sepsis secondary to UTI Patient is independent with adl's. Patient does own DME. Patient rives  himself to appointments.     Patient has family support.       Expected Discharge Plan:  (Home with family support) Barriers to Discharge: Continued Medical Work up   Patient Goals and CMS Choice    Patient plans to be discharge home with family support     Expected Discharge Plan and Services       Living arrangements for the past 2 months: Single Family Home   Prior Living Arrangements/Services Living arrangements for the past 2 months: Single Family Home Lives with:: Spouse Patient language and need for interpreter reviewed:: No          Care giver support system in place?: Yes (comment)      Activities of Daily Living    Independent.  Permission Sought/Granted      Share Information with NAME: Amel Debois        Permission granted to share info w Contact Information: Spouse [phone]  Emotional Assessment Appearance:: Appears stated age Attitude/Demeanor/Rapport: Ambitious Affect (typically observed): Appropriate Orientation: : Oriented to Self, Oriented to Place, Oriented to  Time, Oriented to Situation      Admission diagnosis:  Thrombocytopenia (HCC) [D69.6] Acute UTI [N39.0] Acute febrile illness [R50.9] Generalized weakness [R53.1] Elevated lactic acid level [R79.89] Sepsis (HCC) [A41.9] Stage 4 chronic kidney disease (HCC) [N18.4] Patient Active Problem List   Diagnosis Date Noted   Sepsis secondary to UTI (HCC) 08/30/2023   Unsteady gait 03/11/2023   OAB (overactive bladder) 11/08/2021   Osteoarthritis 05/31/2021    PAD (peripheral artery disease) (HCC) 05/03/2021   Constipation 11/16/2019   Tinnitus 11/16/2019   Dyslipidemia due to type 2 diabetes mellitus (HCC) 08/23/2019   Allergic rhinitis due to pollen 08/19/2019   Hypertension associated with diabetes (HCC) 05/10/2019   GERD (gastroesophageal reflux disease)    Glaucoma    Stage 3 chronic kidney disease (HCC)    Type 2 diabetes mellitus with diabetic nephropathy, without long-term current use of insulin (HCC)    Anemia in chronic kidney disease 07/31/2015   Prostate cancer (HCC) 11/03/2010   PCP:  Rodney Clamp, MD Pharmacy:   [facility] - [place], [place] - [address] [place] [address] Phone: [phone] Fax: [phone]     Social Drivers of Health (SDOH) Social History: SDOH Screenings   Food Insecurity: No Food Insecurity (08/31/2023)  Housing: Unknown (08/31/2023)  Transportation Needs: No Transportation Needs (08/31/2023)  Utilities: Not At Risk (08/31/2023)  Depression (PHQ2-9): Low Risk  (08/28/2023)  Financial Resource Strain: Low Risk  (05/12/2023)  Physical Activity: Sufficiently Active (05/12/2023)  Social Connections: Socially Integrated (08/31/2023)  Stress: No Stress Concern Present (05/12/2023)  Tobacco Use: Low Risk  (08/30/2023)  Health Literacy: Adequate Health Literacy (05/12/2023)   SDOH Interventions:     Readmission Risk Interventions     No data to display

## 2023-08-31 NOTE — Progress Notes (Signed)
 MEWS Progress Note  Patient Details Name: Richard Swanson MRN: [id number] DOB: 07-09-1934 Today's Date: 08/31/2023   MEWS Flowsheet Documentation:  Assess: MEWS Score Temp: (!) 102.8 F (39.3 C) BP: (!) 173/64 MAP (mmHg): 95 Pulse Rate: (!) 102 ECG Heart Rate: 77 Resp: 18 Level of Consciousness: Alert SpO2: 97 % O2 Device: Room Air Assess: MEWS Score MEWS Temp: 2 MEWS Systolic: 0 MEWS Pulse: 1 MEWS RR: 0 MEWS LOC: 0 MEWS Score: 3 MEWS Score Color: Yellow Assess: SIRS CRITERIA SIRS Temperature : 1 SIRS Respirations : 0 SIRS Pulse: 1 SIRS WBC: 0 SIRS Score Sum : 2 Assess: if the MEWS score is Yellow or Red Were vital signs accurate and taken at a resting state?: Yes MEWS guidelines implemented : Yes, yellow Treat MEWS Interventions: Considered administering scheduled or prn medications/treatments as ordered Take Vital Signs Increase Vital Sign Frequency : Yellow: Q2hr x1, continue Q4hrs until patient remains green for 12hrs Escalate MEWS: Escalate: Yellow: Discuss with charge nurse and consider notifying provider and/or RRT   MD notified, Charge RN notified Maintaince fluids discontinued per MD, tylenol administered for temp 102.8 along with hydralazine 10mg  IV for hypertension SBP > 170 per MD order continue to monitor vss pt denies SOB, CX pain, Nausea, Vomitting no signs of distress.      Colon Dear Narda Fundora 08/31/2023, 6:42 AM

## 2023-08-31 NOTE — Plan of Care (Signed)

## 2023-08-31 NOTE — Progress Notes (Signed)
 Patient IV out at change of shift. New IV obtained after several sticks and IV fluids restarted per doctors order.

## 2023-08-31 NOTE — Progress Notes (Signed)
   08/31/23 1857  TOC Assessment  Expected Discharge Plan  (Home with family support)  Barriers to Discharge Continued Medical Work up  Living arrangements for the past 2 months Single Family Home  Lives with: Spouse  Share Information with NAME Doulgas Budnick  Permission granted to share info w Contact Information Spouse [phone]  Patient language and need for interpreter reviewed: No  Care giver support system in place? Y  Appearance: Appears stated age  Attitude/Demeanor/Rapport Ambitious  Affect (typically observed) Appropriate  Orientation:  Oriented to Self;Oriented to Place;Oriented to  Time;Oriented to Situation   Admitted with Sepsis secondary to UTI. time. TOC will continue to monitor patient advancement through interdisciplinary progression rounds.

## 2023-09-01 DIAGNOSIS — A419 Sepsis, unspecified organism: Secondary | ICD-10-CM | POA: Diagnosis not present

## 2023-09-01 DIAGNOSIS — N390 Urinary tract infection, site not specified: Secondary | ICD-10-CM | POA: Diagnosis not present

## 2023-09-01 LAB — GLUCOSE, CAPILLARY
Glucose-Capillary: 138 mg/dL — ABNORMAL HIGH (ref 70–99)
Glucose-Capillary: 231 mg/dL — ABNORMAL HIGH (ref 70–99)
Glucose-Capillary: 200 mg/dL — ABNORMAL HIGH (ref 70–99)
Glucose-Capillary: 136 mg/dL — ABNORMAL HIGH (ref 70–99)

## 2023-09-01 LAB — CBC
HCT: 28.2 % — ABNORMAL LOW (ref 39.0–52.0)
Hemoglobin: 9.5 g/dL — ABNORMAL LOW (ref 13.0–17.0)
MCH: 26.8 pg (ref 26.0–34.0)
MCHC: 33.7 g/dL (ref 30.0–36.0)
MCV: 79.4 fL — ABNORMAL LOW (ref 80.0–100.0)
Platelets: 115 10*3/uL — ABNORMAL LOW (ref 150–400)
RBC: 3.55 MIL/uL — ABNORMAL LOW (ref 4.22–5.81)
RDW: 14.2 % (ref 11.5–15.5)
WBC: 7.1 10*3/uL (ref 4.0–10.5)
nRBC: 0 % (ref 0.0–0.2)

## 2023-09-01 LAB — MAGNESIUM: Magnesium: 1.9 mg/dL (ref 1.7–2.4)

## 2023-09-01 LAB — BASIC METABOLIC PANEL WITH GFR
Anion gap: 6 (ref 5–15)
BUN: 26 mg/dL — ABNORMAL HIGH (ref 8–23)
CO2: 25 mmol/L (ref 22–32)
Calcium: 7.9 mg/dL — ABNORMAL LOW (ref 8.9–10.3)
Chloride: 103 mmol/L (ref 98–111)
Creatinine, Ser: 2.09 mg/dL — ABNORMAL HIGH (ref 0.61–1.24)
GFR, Estimated: 30 mL/min — ABNORMAL LOW (ref >60)
Glucose, Bld: 146 mg/dL — ABNORMAL HIGH (ref 70–99)
Potassium: 3.6 mmol/L (ref 3.5–5.1)
Sodium: 134 mmol/L — ABNORMAL LOW (ref 135–145)

## 2023-09-01 LAB — URINE CULTURE: Culture: NO GROWTH

## 2023-09-01 NOTE — Progress Notes (Signed)
 PROGRESS NOTE    Richard Swanson  WUJ:[id number] DOB: 1934-05-14 DOA: 08/30/2023 PCP: Rodney Clamp, MD   Brief Narrative:    As per H&P written by Dr. Cathyann Cobia on 08/30/2023 Son Richard Swanson is a 89 y.o. male with medical history significant of diabetes, hypertension, hyperlipidemia, stage III chronic kidney disease, history of prostate cancer.  Patient presents with 2 days of increasing weakness, decreased appetite, fever, mild confusion.  He came to the hospital for evaluation.  He was found to have a bladder infection and a fever of 103.3.  Lactic acid was 2.2.  He was given a fluid bolus and started on broad-spectrum antibiotics.  Assessment & Plan:   Principal Problem:   Sepsis secondary to UTI [facility]) Active Problems:   GERD (gastroesophageal reflux disease)   Stage 3 chronic kidney disease (HCC)   Type 2 diabetes mellitus with diabetic nephropathy, without long-term current use of insulin (HCC)   Hypertension associated with diabetes (HCC)   Dyslipidemia due to type 2 diabetes mellitus (HCC)   PAD (peripheral artery disease) (HCC)  Assessment and Plan:   1-sepsis secondary to UTI - Continue to maintain adequate hydration and continue IV antibiotics - Still spiking fever-blood cultures will need to be rechecked and followed - Continue as needed antipyretics and supportive care. - Patient overall expressing feeling better.   2-acute kidney injury on stage III b chronic kidney disease -In the setting of dehydration and UTI - Continue to maintain adequate hydration - Minimize nephrotoxic agents - Follow renal function trend - Creatinine adequately trending down   3-type 2 diabetes with nephropathy - Continue to follow CBG fluctuation and adjust hypoglycemic regimen as needed - Continue sliding scale insulin. - Continue holding oral hypoglycemic agents while inpatient.   4-hypertension - Continue current antihypertensive agent (amlodipine , metoprolol  and as needed  hydralazine); holding home dose avapro until renal function further stabilizes  - Heart healthy diet discussed with patient.   5-hyperlipidemia - Continue statin.   6-history of glaucoma - Continue Xalatan   7-GERD - Continue PPI.    DVT prophylaxis:Lovenox Code Status: Full Family Communication: Daughter at bedside 5/5 Disposition Plan:  Status is: Inpatient Remains inpatient appropriate because: Need for IV medications.   Consultants:  None  Procedures:  None  Antimicrobials:  Anti-infectives (From admission, onward)    Start     Dose/Rate Route Frequency Ordered Stop   09/01/23 0700  cefTRIAXone (ROCEPHIN) 2 g in sodium chloride 0.9 % 100 mL IVPB        2 g 200 mL/hr over 30 Minutes Intravenous Every 24 hours 08/31/23 0904     08/31/23 0524  cefTRIAXone (ROCEPHIN) 1 g in sodium chloride 0.9 % 100 mL IVPB  Status:  Discontinued        1 g 200 mL/hr over 30 Minutes Intravenous Every 24 hours 08/31/23 0524 08/31/23 0904   08/30/23 1845  ceFEPIme (MAXIPIME) 2 g in sodium chloride 0.9 % 100 mL IVPB        2 g 200 mL/hr over 30 Minutes Intravenous  Once 08/30/23 1842 08/30/23 1930   08/30/23 1845  metroNIDAZOLE (FLAGYL) IVPB 500 mg        500 mg 100 mL/hr over 60 Minutes Intravenous  Once 08/30/23 1842 08/30/23 2037   08/30/23 1845  vancomycin (VANCOCIN) IVPB 1000 mg/200 mL premix        1,000 mg 200 mL/hr over 60 Minutes Intravenous  Once 08/30/23 1842 08/30/23 2200       Subjective: Patient  seen and evaluated today with no new acute complaints or concerns. No acute concerns or events noted overnight.  He is overall feeling better and fever has resolved this morning.  Objective: Vitals:   08/31/23 1846 08/31/23 2004 08/31/23 2328 09/01/23 0517  BP: 128/86 (!) 118/52 (!) 120/57 (!) 116/51  Pulse: 84 87 75 81  Resp: 17 20 19 18   Temp: (!) 102.9 F (39.4 C) (!) 100.5 F (38.1 C) 98.7 F (37.1 C) 99.3 F (37.4 C)  TempSrc:  Oral Oral Oral  SpO2: 99% 94% 100%  100%  Weight:      Height:        Intake/Output Summary (Last 24 hours) at 09/01/2023 1135 Last data filed at 08/31/2023 1811 Gross per 24 hour  Intake 470.14 ml  Output --  Net 470.14 ml   Filed Weights   08/30/23 1727 08/30/23 2130  Weight: 73.5 kg 73.9 kg    Examination:  General exam: Appears calm and comfortable  Respiratory system: Clear to auscultation. Respiratory effort normal. Cardiovascular system: S1 & S2 heard, RRR.  Gastrointestinal system: Abdomen is soft Central nervous system: Alert and awake Extremities: No edema Skin: No significant lesions noted Psychiatry: Flat affect.    Data Reviewed: I have personally reviewed following labs and imaging studies  CBC: Recent Labs  Lab 08/30/23 1800 08/31/23 0607 09/01/23 0447  WBC 13.4* 10.9* 7.1  NEUTROABS 11.6*  --   --   HGB 11.8* 10.1* 9.5*  HCT 34.8* 29.8* 28.2*  MCV 80.6 81.0 79.4*  PLT 136* 111* 115*   Basic Metabolic Panel: Recent Labs  Lab 08/30/23 1800 08/31/23 0611 09/01/23 0447  NA 135 133* 134*  K 3.9 3.8 3.6  CL 99 99 103  CO2 25 25 25   GLUCOSE 219* 181* 146*  BUN 28* 24* 26*  CREATININE 2.41* 1.89* 2.09*  CALCIUM  9.3 8.4* 7.9*  MG  --   --  1.9   GFR: Estimated Creatinine Clearance: 26 mL/min (A) (by C-G formula based on SCr of 2.09 mg/dL (H)). Liver Function Tests: Recent Labs  Lab 08/30/23 1800  AST 32  ALT 30  ALKPHOS 58  BILITOT 1.3*  PROT 8.2*  ALBUMIN 3.9   Recent Labs  Lab 08/30/23 1800  LIPASE 23   No results for input(s): "AMMONIA" in the last 168 hours. Coagulation Profile: No results for input(s): "INR", "PROTIME" in the last 168 hours. Cardiac Enzymes: No results for input(s): "CKTOTAL", "CKMB", "CKMBINDEX", "TROPONINI" in the last 168 hours. BNP (last 3 results) No results for input(s): "PROBNP" in the last 8760 hours. HbA1C: No results for input(s): "HGBA1C" in the last 72 hours. CBG: Recent Labs  Lab 08/31/23 1121 08/31/23 1625 08/31/23 2008  09/01/23 0740 09/01/23 1122  GLUCAP 160* 119* 135* 138* 231*   Lipid Profile: No results for input(s): "CHOL", "HDL", "LDLCALC", "TRIG", "CHOLHDL", "LDLDIRECT" in the last 72 hours. Thyroid  Function Tests: No results for input(s): "TSH", "T4TOTAL", "FREET4", "T3FREE", "THYROIDAB" in the last 72 hours. Anemia Panel: No results for input(s): "VITAMINB12", "FOLATE", "FERRITIN", "TIBC", "IRON", "RETICCTPCT" in the last 72 hours. Sepsis Labs: Recent Labs  Lab 08/30/23 1800 08/30/23 2025  LATICACIDVEN 2.2* 1.0    Recent Results (from the past 240 hours)  Blood Culture (routine x 2)     Status: None (Preliminary result)   Collection Time: 08/30/23  6:00 PM   Specimen: Right Antecubital; Blood  Result Value Ref Range Status   Specimen Description   Final    RIGHT ANTECUBITAL BOTTLES  DRAWN AEROBIC AND ANAEROBIC   Special Requests Blood Culture adequate volume  Final   Culture   Final    NO GROWTH 2 DAYS Performed at [facility], [address], [place], [place] [address]    Report Status PENDING  Incomplete  Blood Culture (routine x 2)     Status: None (Preliminary result)   Collection Time: 08/30/23  6:11 PM   Specimen: BLOOD RIGHT ARM  Result Value Ref Range Status   Specimen Description   Final    BLOOD RIGHT ARM BOTTLES DRAWN AEROBIC AND ANAEROBIC Performed at [facility], [address], [place], [place] [address]    Special Requests   Final    Blood Culture adequate volume Performed at [facility], [address], [place], [place] [address]    Culture  Setup Time   Final    GRAM POSITIVE COCCI ANAEROBIC BOTTLE ONLY Gram Stain Report Called to,Read Back By and Verified With: B BERNARD AT 1126 ON [id number] BY S [place] CRITICAL RESULT CALLED TO, READ BACK BY AND VERIFIED WITH: RN Loletha Ripper on [phone] @1515  by SM Performed at [facility], [address], [place], [place] [address]    Culture GRAM POSITIVE COCCI  Final   Report Status PENDING  Incomplete  Blood Culture  ID Panel (Reflexed)     Status: Abnormal   Collection Time: 08/30/23  6:11 PM  Result Value Ref Range Status   Enterococcus faecalis NOT DETECTED NOT DETECTED Final   Enterococcus Faecium NOT DETECTED NOT DETECTED Final   Listeria monocytogenes NOT DETECTED NOT DETECTED Final   Staphylococcus species DETECTED (A) NOT DETECTED Final    Comment: CRITICAL RESULT CALLED TO, READ BACK BY AND VERIFIED WITH: RN Loletha Ripper on [phone] @1515  by SM    Staphylococcus aureus (BCID) NOT DETECTED NOT DETECTED Final   Staphylococcus epidermidis NOT DETECTED NOT DETECTED Final   Staphylococcus lugdunensis NOT DETECTED NOT DETECTED Final   Streptococcus species NOT DETECTED NOT DETECTED Final   Streptococcus agalactiae NOT DETECTED NOT DETECTED Final   Streptococcus pneumoniae NOT DETECTED NOT DETECTED Final   Streptococcus pyogenes NOT DETECTED NOT DETECTED Final   A.calcoaceticus-baumannii NOT DETECTED NOT DETECTED Final   Bacteroides fragilis NOT DETECTED NOT DETECTED Final   Enterobacterales NOT DETECTED NOT DETECTED Final   Enterobacter cloacae complex NOT DETECTED NOT DETECTED Final   Escherichia coli NOT DETECTED NOT DETECTED Final   Klebsiella aerogenes NOT DETECTED NOT DETECTED Final   Klebsiella oxytoca NOT DETECTED NOT DETECTED Final   Klebsiella pneumoniae NOT DETECTED NOT DETECTED Final   Proteus species NOT DETECTED NOT DETECTED Final   Salmonella species NOT DETECTED NOT DETECTED Final   Serratia marcescens NOT DETECTED NOT DETECTED Final   Haemophilus influenzae NOT DETECTED NOT DETECTED Final   Neisseria meningitidis NOT DETECTED NOT DETECTED Final   Pseudomonas aeruginosa NOT DETECTED NOT DETECTED Final   Stenotrophomonas maltophilia NOT DETECTED NOT DETECTED Final   Candida albicans NOT DETECTED NOT DETECTED Final   Candida auris NOT DETECTED NOT DETECTED Final   Candida glabrata NOT DETECTED NOT DETECTED Final   Candida krusei NOT DETECTED NOT DETECTED Final   Candida parapsilosis  NOT DETECTED NOT DETECTED Final   Candida tropicalis NOT DETECTED NOT DETECTED Final   Cryptococcus neoformans/gattii NOT DETECTED NOT DETECTED Final    Comment: Performed at [facility], [address] [place], [place] [address]  Resp panel by RT-PCR (RSV, Flu A&B, Covid) Anterior Nasal Swab  Status: None   Collection Time: 08/30/23  6:13 PM   Specimen: Anterior Nasal Swab  Result Value Ref Range Status   SARS Coronavirus 2 by RT PCR NEGATIVE NEGATIVE Final    Comment: (NOTE) SARS-CoV-2 target nucleic acids are NOT DETECTED.  The SARS-CoV-2 RNA is generally detectable in upper respiratory specimens during the acute phase of infection. The lowest concentration of SARS-CoV-2 viral copies this assay can detect is 138 copies/mL. A negative result does not preclude SARS-Cov-2 infection and should not be used as the sole basis for treatment or other patient management decisions. A negative result may occur with  improper specimen collection/handling, submission of specimen other than nasopharyngeal swab, presence of viral mutation(s) within the areas targeted by this assay, and inadequate number of viral copies(<138 copies/mL). A negative result must be combined with clinical observations, patient history, and epidemiological information. The expected result is Negative.  Fact Sheet for Patients:  BloggerCourse.com  Fact Sheet for Healthcare Providers:  SeriousBroker.it  This test is no t yet approved or cleared by the [place]  FDA and  has been authorized for detection and/or diagnosis of SARS-CoV-2 by FDA under an Emergency Use Authorization (EUA). This EUA will remain  in effect (meaning this test can be used) for the duration of the COVID-19 declaration under Section 564(b)(1) of the Act, 21 U.S.C.section 360bbb-3(b)(1), unless the authorization is terminated  or revoked sooner.       Influenza A by PCR NEGATIVE  NEGATIVE Final   Influenza B by PCR NEGATIVE NEGATIVE Final    Comment: (NOTE) The Xpert Xpress SARS-CoV-2/FLU/RSV plus assay is intended as an aid in the diagnosis of influenza from Nasopharyngeal swab specimens and should not be used as a sole basis for treatment. Nasal washings and aspirates are unacceptable for Xpert Xpress SARS-CoV-2/FLU/RSV testing.  Fact Sheet for Patients: BloggerCourse.com  Fact Sheet for Healthcare Providers: SeriousBroker.it  This test is not yet approved or cleared by the [place]  FDA and has been authorized for detection and/or diagnosis of SARS-CoV-2 by FDA under an Emergency Use Authorization (EUA). This EUA will remain in effect (meaning this test can be used) for the duration of the COVID-19 declaration under Section 564(b)(1) of the Act, 21 U.S.C. section 360bbb-3(b)(1), unless the authorization is terminated or revoked.     Resp Syncytial Virus by PCR NEGATIVE NEGATIVE Final    Comment: (NOTE) Fact Sheet for Patients: BloggerCourse.com  Fact Sheet for Healthcare Providers: SeriousBroker.it  This test is not yet approved or cleared by the [place]  FDA and has been authorized for detection and/or diagnosis of SARS-CoV-2 by FDA under an Emergency Use Authorization (EUA). This EUA will remain in effect (meaning this test can be used) for the duration of the COVID-19 declaration under Section 564(b)(1) of the Act, 21 U.S.C. section 360bbb-3(b)(1), unless the authorization is terminated or revoked.  Performed at [facility], [address] [place], [place] [address]   Urine Culture     Status: None   Collection Time: 08/30/23  7:40 PM   Specimen: Urine, Random  Result Value Ref Range Status   Specimen Description   Final    URINE, RANDOM Performed at [facility], [address], [place], [place] [address]    Special Requests    Final    NONE Reflexed from [id number] Performed at [facility], [address] [place], [place] [address]    Culture   Final    NO GROWTH Performed at [facility], [address], [place], [place] [address]    Report Status 09/01/2023 FINAL  Final         Radiology Studies: DG Chest Port 1 View Result Date: 08/30/2023 CLINICAL DATA:  Questionable sepsis. EXAM: PORTABLE CHEST 1 VIEW COMPARISON:  Chest radiograph dated 08/30/2008. FINDINGS: Shallow inspiration. No focal consolidation, pleural effusion or pneumothorax. Stable cardiac silhouette. Atherosclerotic calcification of the aorta. No acute osseous pathology. IMPRESSION: No active disease. Electronically Signed   By: Angus Bark M.D.   On: 08/30/2023 18:32        Scheduled Meds:  brimonidine  1 drop Both Eyes Daily   enoxaparin (LOVENOX) injection  30 mg Subcutaneous Q24H   fesoterodine  4 mg Oral Daily   insulin aspart  0-15 Units Subcutaneous TID WC   insulin aspart  0-5 Units Subcutaneous QHS   latanoprost  1 drop Both Eyes QHS   metoprolol  succinate  25 mg Oral Daily   rosuvastatin   5 mg Oral Daily   Continuous Infusions:  sodium chloride 75 mL/hr at 09/01/23 0132   cefTRIAXone (ROCEPHIN)  IV 2 g (09/01/23 0657)     LOS: 2 days    Time spent: 55 minutes    Romon Devereux Loran Rock, DO [facility]  If 7PM-7AM, please contact night-coverage [email or web address] 09/01/2023, 11:35 AM

## 2023-09-01 NOTE — Plan of Care (Signed)

## 2023-09-01 NOTE — Progress Notes (Signed)
 Mobility Specialist Progress Note:    09/01/23 1125  Mobility  Activity Ambulated with assistance in hallway  Level of Assistance Contact guard assist, steadying assist  Assistive Device None (uses IV pole for support)  Distance Ambulated (ft) 100 ft  Range of Motion/Exercises Active;All extremities  Activity Response Tolerated well  Mobility Referral Yes  Mobility visit 1 Mobility  Mobility Specialist Start Time (ACUTE ONLY) 1125  Mobility Specialist Stop Time (ACUTE ONLY) 1145  Mobility Specialist Time Calculation (min) (ACUTE ONLY) 20 min   Pt received in bed, daughter in room. Agreeable to mobility, required CGA to stand and ambulate with no AD (used IV pole for support). Tolerated well, asx throughout. Left pt sitting EOB, all needs met.  Myron Stankovich Mobility Specialist Please contact via Special educational needs teacher or  Rehab office at [phone]

## 2023-09-02 ENCOUNTER — Other Ambulatory Visit: Payer: Self-pay | Admitting: Nurse Practitioner

## 2023-09-02 DIAGNOSIS — N390 Urinary tract infection, site not specified: Secondary | ICD-10-CM | POA: Diagnosis not present

## 2023-09-02 DIAGNOSIS — A419 Sepsis, unspecified organism: Secondary | ICD-10-CM | POA: Diagnosis not present

## 2023-09-02 DIAGNOSIS — E782 Mixed hyperlipidemia: Secondary | ICD-10-CM

## 2023-09-02 LAB — BASIC METABOLIC PANEL WITH GFR
Anion gap: 7 (ref 5–15)
BUN: 23 mg/dL (ref 8–23)
CO2: 24 mmol/L (ref 22–32)
Calcium: 7.8 mg/dL — ABNORMAL LOW (ref 8.9–10.3)
Chloride: 105 mmol/L (ref 98–111)
Creatinine, Ser: 1.84 mg/dL — ABNORMAL HIGH (ref 0.61–1.24)
GFR, Estimated: 35 mL/min — ABNORMAL LOW (ref >60)
Glucose, Bld: 146 mg/dL — ABNORMAL HIGH (ref 70–99)
Potassium: 3.5 mmol/L (ref 3.5–5.1)
Sodium: 136 mmol/L (ref 135–145)

## 2023-09-02 LAB — CULTURE, BLOOD (ROUTINE X 2): Special Requests: ADEQUATE

## 2023-09-02 LAB — RETICULOCYTES
Immature Retic Fract: 8 % (ref 2.3–15.9)
RBC.: 3.37 MIL/uL — ABNORMAL LOW (ref 4.22–5.81)
Retic Count, Absolute: 45.2 10*3/uL (ref 19.0–186.0)
Retic Ct Pct: 1.3 % (ref 0.4–3.1)

## 2023-09-02 LAB — HEMOGLOBIN AND HEMATOCRIT, BLOOD
HCT: 26.9 % — ABNORMAL LOW (ref 39.0–52.0)
Hemoglobin: 9 g/dL — ABNORMAL LOW (ref 13.0–17.0)

## 2023-09-02 LAB — CBC
HCT: 25.6 % — ABNORMAL LOW (ref 39.0–52.0)
Hemoglobin: 8.6 g/dL — ABNORMAL LOW (ref 13.0–17.0)
MCH: 26.4 pg (ref 26.0–34.0)
MCHC: 33.6 g/dL (ref 30.0–36.0)
MCV: 78.5 fL — ABNORMAL LOW (ref 80.0–100.0)
Platelets: 123 10*3/uL — ABNORMAL LOW (ref 150–400)
RBC: 3.26 MIL/uL — ABNORMAL LOW (ref 4.22–5.81)
RDW: 14.2 % (ref 11.5–15.5)
WBC: 6.1 10*3/uL (ref 4.0–10.5)
nRBC: 0 % (ref 0.0–0.2)

## 2023-09-02 LAB — MAGNESIUM: Magnesium: 1.8 mg/dL (ref 1.7–2.4)

## 2023-09-02 LAB — IRON AND TIBC
Iron: 16 ug/dL — ABNORMAL LOW (ref 45–182)
Saturation Ratios: 10 % — ABNORMAL LOW (ref 17.9–39.5)
TIBC: 160 ug/dL — ABNORMAL LOW (ref 250–450)
UIBC: 144 ug/dL

## 2023-09-02 LAB — FOLATE: Folate: 12.5 ng/mL (ref >5.9)

## 2023-09-02 LAB — GLUCOSE, CAPILLARY: Glucose-Capillary: 156 mg/dL — ABNORMAL HIGH (ref 70–99)

## 2023-09-02 LAB — VITAMIN B12: Vitamin B-12: 635 pg/mL (ref 180–914)

## 2023-09-02 LAB — FERRITIN: Ferritin: 310 ng/mL (ref 24–336)

## 2023-09-02 MED ORDER — FERROUS SULFATE 325 (65 FE) MG PO TBEC: 325.0000 mg | DELAYED_RELEASE_TABLET | Freq: Two times a day (BID) | ORAL | 3 refills | Status: AC

## 2023-09-02 MED ORDER — DOCUSATE SODIUM 100 MG PO CAPS: 100.0000 mg | ORAL_CAPSULE | Freq: Two times a day (BID) | ORAL | 2 refills | Status: AC

## 2023-09-02 NOTE — Discharge Summary (Signed)
 Physician Discharge Summary  Richard Swanson ZOX:[id number] DOB: 10/17/1934 DOA: 08/30/2023  PCP: Rodney Clamp, MD  Admit date: 08/30/2023  Discharge date: 09/02/2023  Admitted From:Home  Disposition:  Home  Recommendations for Outpatient Follow-up:  Follow up with PCP in 1-2 weeks Continue on home medications as prior Completed course of treatment for UTI with no other concerns noted Patient states he will follow-up with urology to address overactive bladder symptoms  Home Health: None  Equipment/Devices: None  Discharge Condition:Stable  CODE STATUS: Full  Diet recommendation: Heart Healthy/carb modified  Brief/Interim Summary: Richard Swanson is a 89 y.o. male with medical history significant of diabetes, hypertension, hyperlipidemia, stage III chronic kidney disease, history of prostate cancer.  Patient presents with 2 days of increasing weakness, decreased appetite, fever, mild confusion.  He came to the hospital for evaluation.  He was found to have a bladder infection and a fever of 103.3.  Lactic acid was 2.2.  He was given a fluid bolus and started on broad-spectrum antibiotics.  He has now completed course of treatment for his UTI with IV antibiotics and no growth noted on urine cultures.  Initial blood cultures demonstrated growth of gram-positive bacteria and 1 set of blood cultures.  Repeat blood cultures with no growth noted.  He was also noted to have some AKI on CKD stage IIIb in the setting of dehydration which has now improved.  No other acute events noted and he may resume home medications as prior.  Discharge Diagnoses:  Principal Problem:   Sepsis secondary to UTI [facility]) Active Problems:   GERD (gastroesophageal reflux disease)   Stage 3 chronic kidney disease (HCC)   Type 2 diabetes mellitus with diabetic nephropathy, without long-term current use of insulin (HCC)   Hypertension associated with diabetes (HCC)   Dyslipidemia due to type 2 diabetes mellitus  (HCC)   PAD (peripheral artery disease) (HCC)  Principal discharge diagnosis: Sepsis, POA secondary to UTI along with AKI on CKD stage IIIb related to dehydration.  Discharge Instructions  Discharge Instructions     Diet - low sodium heart healthy   Complete by: As directed    Increase activity slowly   Complete by: As directed       Allergies as of 09/02/2023   No Known Allergies      Medication List     TAKE these medications    amLODipine  10 MG tablet Commonly known as: NORVASC  Take 1 tablet (10 mg total) by mouth daily.   brimonidine 0.2 % ophthalmic solution Commonly known as: ALPHAGAN Place 1 drop into both eyes daily.   docusate sodium 100 MG capsule Commonly known as: Colace Take 1 capsule (100 mg total) by mouth 2 (two) times daily.   ferrous sulfate 325 (65 FE) MG EC tablet Take 1 tablet (325 mg total) by mouth 2 (two) times daily.   glipiZIDE  5 MG 24 hr tablet Commonly known as: GLUCOTROL  XL TAKE 1 TABLET BY MOUTH DAILY WITH BREAKFAST   latanoprost 0.005 % ophthalmic solution Commonly known as: XALATAN Place 1 drop into both eyes at bedtime.   metoprolol  succinate 25 MG 24 hr tablet Commonly known as: TOPROL -XL TAKE ONE TABLET BY MOUTH EVERY DAY   rosuvastatin  5 MG tablet Commonly known as: CRESTOR  TAKE ONE TABLET BY MOUTH ONCE DAILY.   sitaGLIPtin  50 MG tablet Commonly known as: Januvia  Take 1 tablet (50 mg total) by mouth daily.   solifenacin  10 MG tablet Commonly known as: VESICARE  TAKE (1) TABLET BY MOUTH  ONCE DAILY.   traMADol  50 MG tablet Commonly known as: ULTRAM  Take 0.5-1 tablets (25-50 mg total) by mouth every 12 (twelve) hours as needed. What changed: reasons to take this   valsartan 40 MG tablet Commonly known as: DIOVAN Take 40 mg by mouth daily.        Follow-up Information     Rodney Clamp, MD. Schedule an appointment as soon as possible for a visit in 1 week(s).   Specialty: Family Medicine Contact  information: [address] [place] [address] [phone]                No Known Allergies  Consultations: None   Procedures/Studies: DG Chest Port 1 View Result Date: 08/30/2023 CLINICAL DATA:  Questionable sepsis. EXAM: PORTABLE CHEST 1 VIEW COMPARISON:  Chest radiograph dated 08/30/2008. FINDINGS: Shallow inspiration. No focal consolidation, pleural effusion or pneumothorax. Stable cardiac silhouette. Atherosclerotic calcification of the aorta. No acute osseous pathology. IMPRESSION: No active disease. Electronically Signed   By: Angus Bark M.D.   On: 08/30/2023 18:32     Discharge Exam: Vitals:   09/01/23 1947 09/02/23 0630  BP: (!) 118/47 139/63  Pulse: 87 84  Resp: 18 18  Temp: 99.6 F (37.6 C) 98.6 F (37 C)  SpO2: 99% 100%   Vitals:   09/01/23 0517 09/01/23 1550 09/01/23 1947 09/02/23 0630  BP: (!) 116/51 135/63 (!) 118/47 139/63  Pulse: 81 81 87 84  Resp: 18 19 18 18   Temp: 99.3 F (37.4 C) 98.7 F (37.1 C) 99.6 F (37.6 C) 98.6 F (37 C)  TempSrc: Oral Oral Oral Oral  SpO2: 100% 99% 99% 100%  Weight:      Height:        General: Pt is alert, awake, not in acute distress Cardiovascular: RRR, S1/S2 +, no rubs, no gallops Respiratory: CTA bilaterally, no wheezing, no rhonchi Abdominal: Soft, NT, ND, bowel sounds + Extremities: no edema, no cyanosis    The results of significant diagnostics from this hospitalization (including imaging, microbiology, ancillary and laboratory) are listed below for reference.     Microbiology: Recent Results (from the past 240 hours)  Blood Culture (routine x 2)     Status: None (Preliminary result)   Collection Time: 08/30/23  6:00 PM   Specimen: Right Antecubital; Blood  Result Value Ref Range Status   Specimen Description   Final    RIGHT ANTECUBITAL BOTTLES DRAWN AEROBIC AND ANAEROBIC   Special Requests Blood Culture adequate volume  Final   Culture   Final    NO GROWTH 3 DAYS Performed at  [facility], [address], [place], [place] [address]    Report Status PENDING  Incomplete  Blood Culture (routine x 2)     Status: Abnormal   Collection Time: 08/30/23  6:11 PM   Specimen: BLOOD RIGHT ARM  Result Value Ref Range Status   Specimen Description   Final    BLOOD RIGHT ARM BOTTLES DRAWN AEROBIC AND ANAEROBIC Performed at [facility], [address] [place], [place] [address]    Special Requests   Final    Blood Culture adequate volume Performed at [facility], [address] [place], [place] [address]    Culture  Setup Time   Final    GRAM POSITIVE COCCI ANAEROBIC BOTTLE ONLY Gram Stain Report Called to,Read Back By and Verified With: B BERNARD AT 1126 ON [id number] BY S DALTON CRITICAL RESULT CALLED TO, READ BACK BY AND VERIFIED WITH: RN Adriana Hopping  H on [id number] @1515  by SM    Culture (A)  Final    STAPHYLOCOCCUS HAEMOLYTICUS THE SIGNIFICANCE OF ISOLATING THIS ORGANISM FROM A SINGLE SET OF BLOOD CULTURES WHEN MULTIPLE SETS ARE DRAWN IS UNCERTAIN. PLEASE NOTIFY THE MICROBIOLOGY DEPARTMENT WITHIN ONE WEEK IF SPECIATION AND SENSITIVITIES ARE REQUIRED. Performed at [facility], [address] [place], [place] [address]    Report Status 09/02/2023 FINAL  Final  Blood Culture ID Panel (Reflexed)     Status: Abnormal   Collection Time: 08/30/23  6:11 PM  Result Value Ref Range Status   Enterococcus faecalis NOT DETECTED NOT DETECTED Final   Enterococcus Faecium NOT DETECTED NOT DETECTED Final   Listeria monocytogenes NOT DETECTED NOT DETECTED Final   Staphylococcus species DETECTED (A) NOT DETECTED Final    Comment: CRITICAL RESULT CALLED TO, READ BACK BY AND VERIFIED WITH: RN Loletha Ripper on [phone] @1515  by SM    Staphylococcus aureus (BCID) NOT DETECTED NOT DETECTED Final   Staphylococcus epidermidis NOT DETECTED NOT DETECTED Final   Staphylococcus lugdunensis NOT DETECTED NOT DETECTED Final   Streptococcus species NOT DETECTED NOT DETECTED Final   Streptococcus  agalactiae NOT DETECTED NOT DETECTED Final   Streptococcus pneumoniae NOT DETECTED NOT DETECTED Final   Streptococcus pyogenes NOT DETECTED NOT DETECTED Final   A.calcoaceticus-baumannii NOT DETECTED NOT DETECTED Final   Bacteroides fragilis NOT DETECTED NOT DETECTED Final   Enterobacterales NOT DETECTED NOT DETECTED Final   Enterobacter cloacae complex NOT DETECTED NOT DETECTED Final   Escherichia coli NOT DETECTED NOT DETECTED Final   Klebsiella aerogenes NOT DETECTED NOT DETECTED Final   Klebsiella oxytoca NOT DETECTED NOT DETECTED Final   Klebsiella pneumoniae NOT DETECTED NOT DETECTED Final   Proteus species NOT DETECTED NOT DETECTED Final   Salmonella species NOT DETECTED NOT DETECTED Final   Serratia marcescens NOT DETECTED NOT DETECTED Final   Haemophilus influenzae NOT DETECTED NOT DETECTED Final   Neisseria meningitidis NOT DETECTED NOT DETECTED Final   Pseudomonas aeruginosa NOT DETECTED NOT DETECTED Final   Stenotrophomonas maltophilia NOT DETECTED NOT DETECTED Final   Candida albicans NOT DETECTED NOT DETECTED Final   Candida auris NOT DETECTED NOT DETECTED Final   Candida glabrata NOT DETECTED NOT DETECTED Final   Candida krusei NOT DETECTED NOT DETECTED Final   Candida parapsilosis NOT DETECTED NOT DETECTED Final   Candida tropicalis NOT DETECTED NOT DETECTED Final   Cryptococcus neoformans/gattii NOT DETECTED NOT DETECTED Final    Comment: Performed at [facility], [address], [place], [place] [address]  Resp panel by RT-PCR (RSV, Flu A&B, Covid) Anterior Nasal Swab     Status: None   Collection Time: 08/30/23  6:13 PM   Specimen: Anterior Nasal Swab  Result Value Ref Range Status   SARS Coronavirus 2 by RT PCR NEGATIVE NEGATIVE Final    Comment: (NOTE) SARS-CoV-2 target nucleic acids are NOT DETECTED.  The SARS-CoV-2 RNA is generally detectable in upper respiratory specimens during the acute phase of infection. The lowest concentration of SARS-CoV-2  viral copies this assay can detect is 138 copies/mL. A negative result does not preclude SARS-Cov-2 infection and should not be used as the sole basis for treatment or other patient management decisions. A negative result may occur with  improper specimen collection/handling, submission of specimen other than nasopharyngeal swab, presence of viral mutation(s) within the areas targeted by this assay, and inadequate number of viral copies(<138 copies/mL). A negative result must be combined with clinical observations, patient  history, and epidemiological information. The expected result is Negative.  Fact Sheet for Patients:  BloggerCourse.com  Fact Sheet for Healthcare Providers:  SeriousBroker.it  This test is no t yet approved or cleared by the [place]  FDA and  has been authorized for detection and/or diagnosis of SARS-CoV-2 by FDA under an Emergency Use Authorization (EUA). This EUA will remain  in effect (meaning this test can be used) for the duration of the COVID-19 declaration under Section 564(b)(1) of the Act, 21 U.S.C.section 360bbb-3(b)(1), unless the authorization is terminated  or revoked sooner.       Influenza A by PCR NEGATIVE NEGATIVE Final   Influenza B by PCR NEGATIVE NEGATIVE Final    Comment: (NOTE) The Xpert Xpress SARS-CoV-2/FLU/RSV plus assay is intended as an aid in the diagnosis of influenza from Nasopharyngeal swab specimens and should not be used as a sole basis for treatment. Nasal washings and aspirates are unacceptable for Xpert Xpress SARS-CoV-2/FLU/RSV testing.  Fact Sheet for Patients: BloggerCourse.com  Fact Sheet for Healthcare Providers: SeriousBroker.it  This test is not yet approved or cleared by the [place]  FDA and has been authorized for detection and/or diagnosis of SARS-CoV-2 by FDA under an Emergency Use Authorization  (EUA). This EUA will remain in effect (meaning this test can be used) for the duration of the COVID-19 declaration under Section 564(b)(1) of the Act, 21 U.S.C. section 360bbb-3(b)(1), unless the authorization is terminated or revoked.     Resp Syncytial Virus by PCR NEGATIVE NEGATIVE Final    Comment: (NOTE) Fact Sheet for Patients: BloggerCourse.com  Fact Sheet for Healthcare Providers: SeriousBroker.it  This test is not yet approved or cleared by the [place]  FDA and has been authorized for detection and/or diagnosis of SARS-CoV-2 by FDA under an Emergency Use Authorization (EUA). This EUA will remain in effect (meaning this test can be used) for the duration of the COVID-19 declaration under Section 564(b)(1) of the Act, 21 U.S.C. section 360bbb-3(b)(1), unless the authorization is terminated or revoked.  Performed at [facility], [address], [place], [place] [address]   Urine Culture     Status: None   Collection Time: 08/30/23  7:40 PM   Specimen: Urine, Random  Result Value Ref Range Status   Specimen Description   Final    URINE, RANDOM Performed at [facility], [address], [place], [place] [address]    Special Requests   Final    NONE Reflexed from [id number] Performed at [facility], [address], [place], [place] [address]    Culture   Final    NO GROWTH Performed at [facility], [address] [place], [place] [address]    Report Status 09/01/2023 FINAL  Final  Culture, blood (Routine X 2) w Reflex to ID Panel     Status: None (Preliminary result)   Collection Time: 09/01/23  8:53 AM   Specimen: BLOOD  Result Value Ref Range Status   Specimen Description BLOOD BLOOD LEFT ARM  Final   Special Requests   Final    BOTTLES DRAWN AEROBIC AND ANAEROBIC Blood Culture adequate volume   Culture   Final    NO GROWTH < 24 HOURS Performed at [facility], [address] [place], [place] [address]     Report Status PENDING  Incomplete  Culture, blood (Routine X 2) w Reflex to ID Panel     Status: None (Preliminary result)   Collection Time: 09/01/23  8:54 AM   Specimen: BLOOD  Result Value Ref Range Status   Specimen Description BLOOD BLOOD RIGHT ARM  Final   Special Requests   Final    BOTTLES DRAWN AEROBIC AND ANAEROBIC Blood Culture adequate volume   Culture   Final    NO GROWTH < 24 HOURS Performed at [facility], [address], [place], [place] [address]    Report Status PENDING  Incomplete     Labs: BNP (last 3 results) No results for input(s): "BNP" in the last 8760 hours. Basic Metabolic Panel: Recent Labs  Lab 08/30/23 1800 08/31/23 0611 09/01/23 0447 09/02/23 0514  NA 135 133* 134* 136  K 3.9 3.8 3.6 3.5  CL 99 99 103 105  CO2 25 25 25 24   GLUCOSE 219* 181* 146* 146*  BUN 28* 24* 26* 23  CREATININE 2.41* 1.89* 2.09* 1.84*  CALCIUM  9.3 8.4* 7.9* 7.8*  MG  --   --  1.9 1.8   Liver Function Tests: Recent Labs  Lab 08/30/23 1800  AST 32  ALT 30  ALKPHOS 58  BILITOT 1.3*  PROT 8.2*  ALBUMIN 3.9   Recent Labs  Lab 08/30/23 1800  LIPASE 23   No results for input(s): "AMMONIA" in the last 168 hours. CBC: Recent Labs  Lab 08/30/23 1800 08/31/23 0607 09/01/23 0447 09/02/23 0514 09/02/23 0840  WBC 13.4* 10.9* 7.1 6.1  --   NEUTROABS 11.6*  --   --   --   --   HGB 11.8* 10.1* 9.5* 8.6* 9.0*  HCT 34.8* 29.8* 28.2* 25.6* 26.9*  MCV 80.6 81.0 79.4* 78.5*  --   PLT 136* 111* 115* 123*  --    Cardiac Enzymes: No results for input(s): "CKTOTAL", "CKMB", "CKMBINDEX", "TROPONINI" in the last 168 hours. BNP: Invalid input(s): "POCBNP" CBG: Recent Labs  Lab 09/01/23 0740 09/01/23 1122 09/01/23 1618 09/01/23 1949 09/02/23 0746  GLUCAP 138* 231* 200* 136* 156*   D-Dimer No results for input(s): "DDIMER" in the last 72 hours. Hgb A1c No results for input(s): "HGBA1C" in the last 72 hours. Lipid Profile No results for input(s): "CHOL",  "HDL", "LDLCALC", "TRIG", "CHOLHDL", "LDLDIRECT" in the last 72 hours. Thyroid  function studies No results for input(s): "TSH", "T4TOTAL", "T3FREE", "THYROIDAB" in the last 72 hours.  Invalid input(s): "FREET3" Anemia work up Recent Labs    09/02/23 0840  VITAMINB12 635  FOLATE 12.5  FERRITIN 310  TIBC 160*  IRON 16*  RETICCTPCT 1.3   Urinalysis    Component Value Date/Time   COLORURINE YELLOW 08/30/2023 1940   APPEARANCEUR CLOUDY (A) 08/30/2023 1940   APPEARANCEUR Clear 01/14/2023 1238   LABSPEC 1.014 08/30/2023 1940   PHURINE 5.0 08/30/2023 1940   GLUCOSEU 50 (A) 08/30/2023 1940   HGBUR MODERATE (A) 08/30/2023 1940   BILIRUBINUR NEGATIVE 08/30/2023 1940   BILIRUBINUR Negative 01/14/2023 1238   KETONESUR NEGATIVE 08/30/2023 1940   PROTEINUR 100 (A) 08/30/2023 1940   NITRITE POSITIVE (A) 08/30/2023 1940   LEUKOCYTESUR LARGE (A) 08/30/2023 1940   Sepsis Labs Recent Labs  Lab 08/30/23 1800 08/31/23 0607 09/01/23 0447 09/02/23 0514  WBC 13.4* 10.9* 7.1 6.1   Microbiology Recent Results (from the past 240 hours)  Blood Culture (routine x 2)     Status: None (Preliminary result)   Collection Time: 08/30/23  6:00 PM   Specimen: Right Antecubital; Blood  Result Value Ref Range Status   Specimen Description   Final    RIGHT ANTECUBITAL BOTTLES DRAWN AEROBIC AND ANAEROBIC   Special Requests Blood Culture adequate volume  Final   Culture   Final    NO GROWTH 3 DAYS Performed at [facility], [address], [place], [place] [address]    Report Status PENDING  Incomplete  Blood Culture (routine x 2)     Status: Abnormal   Collection Time: 08/30/23  6:11 PM   Specimen: BLOOD RIGHT ARM  Result Value Ref Range Status   Specimen Description   Final    BLOOD RIGHT ARM BOTTLES DRAWN AEROBIC AND ANAEROBIC Performed at [facility], [address], [place], [place] [address]    Special Requests   Final    Blood Culture adequate volume Performed at [facility], [address] [place], [place] [address]    Culture  Setup Time   Final    GRAM POSITIVE COCCI ANAEROBIC BOTTLE ONLY Gram Stain Report Called to,Read Back By and Verified With: B BERNARD AT 1126 ON [id number] BY S DALTON CRITICAL RESULT CALLED TO, READ BACK BY AND VERIFIED WITH: RN Loletha Ripper on [phone] @1515  by SM    Culture (A)  Final    STAPHYLOCOCCUS HAEMOLYTICUS THE SIGNIFICANCE OF ISOLATING THIS ORGANISM FROM A SINGLE SET OF BLOOD CULTURES WHEN MULTIPLE SETS ARE DRAWN IS UNCERTAIN. PLEASE NOTIFY THE MICROBIOLOGY DEPARTMENT WITHIN ONE WEEK IF SPECIATION AND SENSITIVITIES ARE REQUIRED. Performed at [facility], [address], [place], [place] [address]    Report Status 09/02/2023 FINAL  Final  Blood Culture ID Panel (Reflexed)     Status: Abnormal   Collection Time: 08/30/23  6:11 PM  Result Value Ref Range Status   Enterococcus faecalis NOT DETECTED NOT DETECTED Final   Enterococcus Faecium NOT DETECTED NOT DETECTED Final   Listeria monocytogenes NOT DETECTED NOT DETECTED Final   Staphylococcus species DETECTED (A) NOT DETECTED Final    Comment: CRITICAL RESULT CALLED TO, READ BACK BY AND VERIFIED WITH: RN Loletha Ripper on [phone] @1515  by SM    Staphylococcus aureus (BCID) NOT DETECTED NOT DETECTED Final   Staphylococcus epidermidis NOT DETECTED NOT DETECTED Final   Staphylococcus lugdunensis NOT DETECTED NOT DETECTED Final   Streptococcus species NOT DETECTED NOT DETECTED Final   Streptococcus agalactiae NOT DETECTED NOT DETECTED Final   Streptococcus pneumoniae NOT DETECTED NOT DETECTED Final   Streptococcus pyogenes NOT DETECTED NOT DETECTED Final   A.calcoaceticus-baumannii NOT DETECTED NOT DETECTED Final   Bacteroides fragilis NOT DETECTED NOT DETECTED Final   Enterobacterales NOT DETECTED NOT DETECTED Final   Enterobacter cloacae complex NOT DETECTED NOT DETECTED Final   Escherichia coli NOT DETECTED NOT DETECTED Final   Klebsiella aerogenes NOT DETECTED NOT DETECTED Final    Klebsiella oxytoca NOT DETECTED NOT DETECTED Final   Klebsiella pneumoniae NOT DETECTED NOT DETECTED Final   Proteus species NOT DETECTED NOT DETECTED Final   Salmonella species NOT DETECTED NOT DETECTED Final   Serratia marcescens NOT DETECTED NOT DETECTED Final   Haemophilus influenzae NOT DETECTED NOT DETECTED Final   Neisseria meningitidis NOT DETECTED NOT DETECTED Final   Pseudomonas aeruginosa NOT DETECTED NOT DETECTED Final   Stenotrophomonas maltophilia NOT DETECTED NOT DETECTED Final   Candida albicans NOT DETECTED NOT DETECTED Final   Candida auris NOT DETECTED NOT DETECTED Final   Candida glabrata NOT DETECTED NOT DETECTED Final   Candida krusei NOT DETECTED NOT DETECTED Final   Candida parapsilosis NOT DETECTED NOT DETECTED Final   Candida tropicalis NOT DETECTED NOT DETECTED Final   Cryptococcus neoformans/gattii NOT DETECTED NOT DETECTED Final    Comment: Performed at [facility], [address] [place], [place] [address]  Resp panel by RT-PCR (RSV, Flu A&B, Covid) Anterior Nasal Swab     Status: None   Collection Time: 08/30/23  6:13 PM   Specimen: Anterior Nasal Swab  Result Value Ref Range Status   SARS Coronavirus 2 by RT PCR NEGATIVE NEGATIVE Final    Comment: (NOTE) SARS-CoV-2 target nucleic acids are NOT DETECTED.  The SARS-CoV-2 RNA is generally detectable in upper respiratory specimens during the acute phase of infection. The lowest concentration of SARS-CoV-2 viral copies this assay can detect is 138 copies/mL. A negative result does not preclude SARS-Cov-2 infection and should not be used as the sole basis for treatment or other patient management decisions. A negative result may occur with  improper specimen collection/handling, submission of specimen other than nasopharyngeal swab, presence of viral mutation(s) within the areas targeted by this assay, and inadequate number of viral copies(<138 copies/mL). A negative result must be combined  with clinical observations, patient history, and epidemiological information. The expected result is Negative.  Fact Sheet for Patients:  BloggerCourse.com  Fact Sheet for Healthcare Providers:  SeriousBroker.it  This test is no t yet approved or cleared by the [place]  FDA and  has been authorized for detection and/or diagnosis of SARS-CoV-2 by FDA under an Emergency Use Authorization (EUA). This EUA will remain  in effect (meaning this test can be used) for the duration of the COVID-19 declaration under Section 564(b)(1) of the Act, 21 U.S.C.section 360bbb-3(b)(1), unless the authorization is terminated  or revoked sooner.       Influenza A by PCR NEGATIVE NEGATIVE Final   Influenza B by PCR NEGATIVE NEGATIVE Final    Comment: (NOTE) The Xpert Xpress SARS-CoV-2/FLU/RSV plus assay is intended as an aid in the diagnosis of influenza from Nasopharyngeal swab specimens and should not be used as a sole basis for treatment. Nasal washings and aspirates are unacceptable for Xpert Xpress SARS-CoV-2/FLU/RSV testing.  Fact Sheet for Patients: BloggerCourse.com  Fact Sheet for Healthcare Providers: SeriousBroker.it  This test is not yet approved or cleared by the [place]  FDA and has been authorized for detection and/or diagnosis of SARS-CoV-2 by FDA under an Emergency Use Authorization (EUA). This EUA will remain in effect (meaning this test can be used) for the duration of the COVID-19 declaration under Section 564(b)(1) of the Act, 21 U.S.C. section 360bbb-3(b)(1), unless the authorization is terminated or revoked.     Resp Syncytial Virus by PCR NEGATIVE NEGATIVE Final    Comment: (NOTE) Fact Sheet for Patients: BloggerCourse.com  Fact Sheet for Healthcare Providers: SeriousBroker.it  This test is not yet approved  or cleared by the [place]  FDA and has been authorized for detection and/or diagnosis of SARS-CoV-2 by FDA under an Emergency Use Authorization (EUA). This EUA will remain in effect (meaning this test can be used) for the duration of the COVID-19 declaration under Section 564(b)(1) of the Act, 21 U.S.C. section 360bbb-3(b)(1), unless the authorization is terminated or revoked.  Performed at [facility], [address] [place], [place] [address]   Urine Culture     Status: None   Collection Time: 08/30/23  7:40 PM   Specimen: Urine, Random  Result Value Ref Range Status   Specimen Description   Final    URINE, RANDOM Performed at [facility] [place] [facility], [address] [place], [place] [address]    Special Requests   Final    NONE Reflexed from [id number] Performed at [facility], [address], [place], [place] [address]    Culture   Final    NO GROWTH Performed at [facility], [address], [place], [place] [address]    Report Status 09/01/2023 FINAL  Final  Culture, blood (Routine X 2) w Reflex to ID Panel     Status: None (Preliminary result)   Collection Time: 09/01/23  8:53 AM   Specimen: BLOOD  Result Value Ref Range Status   Specimen Description BLOOD BLOOD LEFT ARM  Final   Special Requests   Final    BOTTLES DRAWN AEROBIC AND ANAEROBIC Blood Culture adequate volume   Culture   Final    NO GROWTH < 24 HOURS Performed at [facility], [address], [place], [place] [address]    Report Status PENDING  Incomplete  Culture, blood (Routine X 2) w Reflex to ID Panel     Status: None (Preliminary result)   Collection Time: 09/01/23  8:54 AM   Specimen: BLOOD  Result Value Ref Range Status   Specimen Description BLOOD BLOOD RIGHT ARM  Final   Special Requests   Final    BOTTLES DRAWN AEROBIC AND ANAEROBIC Blood Culture adequate volume   Culture   Final    NO GROWTH < 24 HOURS Performed at [facility], [address] [place], [place] [address]    Report Status  PENDING  Incomplete     Time coordinating discharge: 35 minutes  SIGNED:   Cornelius Dill, DO [facility] 09/02/2023, 10:35 AM  If 7PM-7AM, please contact night-coverage [email or web address]

## 2023-09-02 NOTE — Plan of Care (Signed)
   Problem: Education: Goal: Knowledge of General Education information will improve Description: Including pain rating scale, medication(s)/side effects and non-pharmacologic comfort measures Outcome: Progressing   Problem: Safety: Goal: Ability to remain free from injury will improve Outcome: Progressing   Problem: Skin Integrity: Goal: Risk for impaired skin integrity will decrease Outcome: Progressing

## 2023-09-02 NOTE — Progress Notes (Signed)
 IV removed and discharge instructions reviewed with patient and daughter. Scripts sent to [facility]. To call primary for follow up . Family to give ride home

## 2023-09-02 NOTE — Care Management Important Message (Signed)
 Important Message  Patient Details  Name: Richard Swanson MRN: [id number] Date of Birth: 07-01-1934   Important Message Given:  N/A - LOS <3 / Initial given by admissions     Neila Bally 09/02/2023, 10:55 AM

## 2023-09-03 ENCOUNTER — Telehealth: Payer: Self-pay | Admitting: *Deleted

## 2023-09-03 NOTE — Transitions of Care (Post Inpatient/ED Visit) (Signed)
   09/03/2023  Name: Richard Swanson. MRN: [id number] DOB: 05/09/1934  Today's TOC FU Call Status: Today's TOC FU Call Status:: Unsuccessful Call (1st Attempt) Unsuccessful Call (1st Attempt) Date: 09/03/23  Attempted to reach the patient regarding the most recent Inpatient/ED visit.  Follow Up Plan: Additional outreach attempts will be made to reach the patient to complete the Transitions of Care (Post Inpatient/ED visit) call.   Cecilie Coffee [facility], BSN RN Care Manager/ Transition of Care [place]/ [facility] [phone]

## 2023-09-04 ENCOUNTER — Ambulatory Visit (INDEPENDENT_AMBULATORY_CARE_PROVIDER_SITE_OTHER): Admitting: Family Medicine

## 2023-09-04 ENCOUNTER — Encounter: Payer: Self-pay | Admitting: Family Medicine

## 2023-09-04 ENCOUNTER — Telehealth: Payer: Self-pay | Admitting: *Deleted

## 2023-09-04 VITALS — BP 118/70 | HR 63 | Temp 98.1°F | Ht 72.0 in | Wt 169.8 lb

## 2023-09-04 DIAGNOSIS — G4700 Insomnia, unspecified: Secondary | ICD-10-CM | POA: Insufficient documentation

## 2023-09-04 DIAGNOSIS — K5900 Constipation, unspecified: Secondary | ICD-10-CM

## 2023-09-04 DIAGNOSIS — N3281 Overactive bladder: Secondary | ICD-10-CM | POA: Diagnosis not present

## 2023-09-04 DIAGNOSIS — E1159 Type 2 diabetes mellitus with other circulatory complications: Secondary | ICD-10-CM | POA: Diagnosis not present

## 2023-09-04 DIAGNOSIS — I152 Hypertension secondary to endocrine disorders: Secondary | ICD-10-CM

## 2023-09-04 LAB — CULTURE, BLOOD (ROUTINE X 2): Special Requests: ADEQUATE

## 2023-09-04 NOTE — Assessment & Plan Note (Addendum)
 We restarted amlodipine  at this visit here last week.  Also continue valsartan 40 mg daily and metoprolol  succinate 25 mg daily.  Blood pressure was initially elevated today however on recheck it was back at goal.  He will monitor at home and let us  know if persistently elevated.Aaron Aas

## 2023-09-04 NOTE — Assessment & Plan Note (Signed)
 Started on Colace during hospitalization.  Doing well with this.  He can also continue MiraLAX as needed.

## 2023-09-04 NOTE — Progress Notes (Signed)
 Chief Complaint:  Richard Swanson. is a 89 y.o. male who presents today for a TCM visit.  Assessment/Plan:  New/Acute Problems: Urinary tract infection Resolved.  He will let us  know if he has any recurrence of symptoms.  Chronic Problems Addressed Today: Insomnia Patient with frequent nocturia related to his OAB.  He is currently following with urology for this and is on Vesicare .  He sometimes has difficulty with getting back to sleep.  Recommend trial of over-the-counter melatonin.  If he does not do well with this would consider trial of Remeron or gabapentin  Constipation Started on Colace during hospitalization.  Doing well with this.  He can also continue MiraLAX as needed.  OAB (overactive bladder) On Vesicare  for urology.  Still having nocturnal awakenings however symptoms are overall manageable.  He will follow-up with urology to see him to discuss any other treatment options.  Hypertension associated with diabetes (HCC) We restarted amlodipine  at this visit here last week.  Also continue valsartan 40 mg daily and metoprolol  succinate 25 mg daily.  Blood pressure was initially elevated today however on recheck it was back at goal.  He will monitor at home and let us  know if persistently elevated..     Subjective:  HPI:  Summary of Hospital admission: Reason for admission: Complicated UTI with sepsis Date of admission: 08/30/2023 Date of discharge: 09/02/2023 Date of Interactive contact: N/A -today is within 2 business days of discharge Summary of Hospital course: Patient presented to the ED on 08/30/2023 with weakness, decreased appetite, fever, and confusion.  Initial workup showed fever of 103.3 with elevated lactic acid.  Started on broad-spectrum antibiotics.  There was concern for UTI on UA however urine culture was negative.  His blood culture did show growth of gram-positive bacteria on 1 set of blood culture however repeat blood cultures were negative. He was  discharged home.  Did have mild AKI that resolved during hospitalization.  Interim history:  He has done well the last few days since being home. He has not had any recurrence of symptoms. No fevers or chills. He has been consistent with his medications.  He does note that his sleep has been disrupted for the last few weeks especially since hospitalization.  He wakes up for back to urinate but this is overall manageable.  ROS: Per HPI, otherwise a complete review of systems was negative.   PMH:  The following were reviewed and entered/updated in epic: Past Medical History:  Diagnosis Date   Cancer (HCC)    Prostate   Chronic obstructive pulmonary disease, unspecified (HCC)    Diabetes mellitus without complication (HCC)    Dizziness    Gastro-esophageal reflux disease without esophagitis    Glaucoma    Glomerular disorders in diseases classified elsewhere    Gross hematuria    Hypertensive chronic kidney disease with stage 1 through stage 4 chronic kidney disease, or unspecified chronic kidney disease    Kidney disease, chronic, stage III (moderate, EGFR 30-59 ml/min) (HCC)    Malignant neoplasm of prostate (HCC)    Prostate cancer (HCC) 11/03/2010   dx 2006   Type 2 diabetes mellitus with diabetic nephropathy (HCC)    Unspecified kidney failure    Patient Active Problem List   Diagnosis Date Noted   Insomnia 09/04/2023   Unsteady gait 03/11/2023   OAB (overactive bladder) 11/08/2021   Osteoarthritis 05/31/2021   PAD (peripheral artery disease) (HCC) 05/03/2021   Constipation 11/16/2019   Tinnitus 11/16/2019  Dyslipidemia due to type 2 diabetes mellitus (HCC) 08/23/2019   Allergic rhinitis due to pollen 08/19/2019   Hypertension associated with diabetes (HCC) 05/10/2019   GERD (gastroesophageal reflux disease)    Glaucoma    Stage 3 chronic kidney disease (HCC)    Type 2 diabetes mellitus with diabetic nephropathy, without long-term current use of insulin (HCC)    Anemia  in chronic kidney disease 07/31/2015   Prostate cancer (HCC) 11/03/2010   Past Surgical History:  Procedure Laterality Date   PROSTATE SURGERY      Family History  Problem Relation Age of Onset   Stroke Father     Medications- Reconciled discharge and current medications in Epic.  Current Outpatient Medications  Medication Sig Dispense Refill   amLODipine  (NORVASC ) 10 MG tablet Take 1 tablet (10 mg total) by mouth daily. 90 tablet 3   brimonidine (ALPHAGAN) 0.2 % ophthalmic solution Place 1 drop into both eyes daily.     docusate sodium (COLACE) 100 MG capsule Take 1 capsule (100 mg total) by mouth 2 (two) times daily. 60 capsule 2   ferrous sulfate 325 (65 FE) MG EC tablet Take 1 tablet (325 mg total) by mouth 2 (two) times daily. 60 tablet 3   glipiZIDE  (GLUCOTROL  XL) 5 MG 24 hr tablet TAKE 1 TABLET BY MOUTH DAILY WITH BREAKFAST 30 tablet 5   latanoprost (XALATAN) 0.005 % ophthalmic solution Place 1 drop into both eyes at bedtime.     metoprolol  succinate (TOPROL -XL) 25 MG 24 hr tablet TAKE ONE TABLET BY MOUTH EVERY DAY 90 tablet 0   rosuvastatin  (CRESTOR ) 5 MG tablet TAKE ONE TABLET BY MOUTH ONCE DAILY. 90 tablet 0   sitaGLIPtin  (JANUVIA ) 50 MG tablet Take 1 tablet (50 mg total) by mouth daily. 100 tablet 3   solifenacin  (VESICARE ) 10 MG tablet TAKE (1) TABLET BY MOUTH ONCE DAILY. 30 tablet 11   traMADol  (ULTRAM ) 50 MG tablet Take 0.5-1 tablets (25-50 mg total) by mouth every 12 (twelve) hours as needed. (Patient taking differently: Take 25-50 mg by mouth every 12 (twelve) hours as needed for moderate pain (pain score 4-6).) 30 tablet 5   valsartan (DIOVAN) 40 MG tablet Take 40 mg by mouth daily.     No current facility-administered medications for this visit.    Allergies-reviewed and updated No Known Allergies  Social History   Socioeconomic History   Marital status: Married    Spouse name: Not on file   Number of children: Not on file   Years of education: Not on file    Highest education level: Not on file  Occupational History   Occupation: Retired  Tobacco Use   Smoking status: Never   Smokeless tobacco: Never  Vaping Use   Vaping status: Never Used  Substance and Sexual Activity   Alcohol use: No   Drug use: No   Sexual activity: Not on file  Other Topics Concern   Not on file  Social History Narrative   Not on file   Social Drivers of Health   Financial Resource Strain: Low Risk  (05/12/2023)   Overall Financial Resource Strain (CARDIA)    Difficulty of Paying Living Expenses: Not hard at all  Food Insecurity: No Food Insecurity (08/31/2023)   Hunger Vital Sign    Worried About Running Out of Food in the Last Year: Never true    Ran Out of Food in the Last Year: Never true  Transportation Needs: No Transportation Needs (08/31/2023)   PRAPARE -  Administrator, Civil Service (Medical): No    Lack of Transportation (Non-Medical): No  Physical Activity: Sufficiently Active (05/12/2023)   Exercise Vital Sign    Days of Exercise per Week: 3 days    Minutes of Exercise per Session: 60 min  Stress: No Stress Concern Present (05/12/2023)   [employer] of Occupational Health - Occupational Stress Questionnaire    Feeling of Stress : Not at all  Social Connections: Socially Integrated (08/31/2023)   Social Connection and Isolation Panel [NHANES]    Frequency of Communication with Friends and Family: Three times a week    Frequency of Social Gatherings with Friends and Family: Three times a week    Attends Religious Services: More than 4 times per year    Active Member of Clubs or Organizations: Yes    Attends Banker Meetings: 1 to 4 times per year    Marital Status: Married        Objective:  Physical Exam: BP 118/70   Pulse 63   Temp 98.1 F (36.7 C) (Temporal)   Ht 6' (1.829 m)   Wt 169 lb 12.8 oz (77 kg)   SpO2 96%   BMI 23.03 kg/m   Gen: NAD, resting comfortably CV: RRR with no murmurs  appreciated Pulm: NWOB, CTAB with no crackles, wheezes, or rhonchi GI: Normal bowel sounds present. Soft, Nontender, Nondistended. MSK: No edema, cyanosis, or clubbing noted Skin: Warm, dry Neuro: Grossly normal, moves all extremities Psych: Normal affect and thought content  Time Spent: 45 minutes of total time was spent on the date of the encounter performing the following actions: chart review prior to seeing the patient including recent hospitalization obtaining history, performing a medically necessary exam, counseling on the treatment plan, placing orders, and documenting in our EHR.        Jinny Mounts. Daneil Dunker, MD 09/04/2023 12:16 PM

## 2023-09-04 NOTE — Assessment & Plan Note (Signed)
 Patient with frequent nocturia related to his OAB.  He is currently following with urology for this and is on Vesicare .  He sometimes has difficulty with getting back to sleep.  Recommend trial of over-the-counter melatonin.  If he does not do well with this would consider trial of Remeron or gabapentin

## 2023-09-04 NOTE — Transitions of Care (Post Inpatient/ED Visit) (Signed)
 09/04/2023  Name: Richard Swanson. MRN: [id number] DOB: 1935-01-15  Today's TOC FU Call Status: Today's TOC FU Call Status:: Successful TOC FU Call Completed TOC FU Call Complete Date: 09/04/23 Patient's Name and Date of Birth confirmed.  Transition Care Management Follow-up Telephone Call Date of Discharge: 09/02/23 [facility] Type of Discharge: Inpatient Admission Primary Inpatient Discharge Diagnosis:: Sepsis secondary to UTI How have you been since you were released from the hospital?:  (pt eating, drinking well, ambulating without difficulty, saw primary care provider today 09/04/23) Any questions or concerns?: No  Items Reviewed: Did you receive and understand the discharge instructions provided?: Yes Medications obtained,verified, and reconciled?: Yes (Medications Reviewed) Any new allergies since your discharge?: No Dietary orders reviewed?: Yes Type of Diet Ordered:: heart healthy, carbohydrate modified Do you have support at home?: Yes People in Home [RPT]: spouse Name of Support/Comfort Primary Source: Twanda Gala Reviewed signs/ symptoms infection/ UTI, reportable signs/ symptoms  Medications Reviewed Today: Medications Reviewed Today     Reviewed by Daralyn Earl, RN (Registered Nurse) on 09/04/23 at 1440  Med List Status: <None>   Medication Order Taking? Sig Documenting Provider Last Dose Status Informant  amLODipine  (NORVASC ) 10 MG tablet [id number] Yes Take 1 tablet (10 mg total) by mouth daily. Rodney Clamp, MD Taking Active Self  brimonidine (ALPHAGAN) 0.2 % ophthalmic solution [id number] Yes Place 1 drop into both eyes daily. [provider] Taking Active Self  docusate sodium (COLACE) 100 MG capsule [id number] Yes Take 1 capsule (100 mg total) by mouth 2 (two) times daily. Mason Sole, Pratik D, DO Taking Active   ferrous sulfate 325 (65 FE) MG EC tablet [id number] Yes Take 1 tablet (325 mg total) by mouth 2 (two) times daily.  Mason Sole, Pratik D, DO Taking Active   glipiZIDE  (GLUCOTROL  XL) 5 MG 24 hr tablet [id number] Yes TAKE 1 TABLET BY MOUTH DAILY WITH BREAKFAST Reardon, Arminda Landmark, NP Taking Active Self  latanoprost (XALATAN) 0.005 % ophthalmic solution [id number] Yes Place 1 drop into both eyes at bedtime. [provider] Taking Active Self  metoprolol  succinate (TOPROL -XL) 25 MG 24 hr tablet [id number] Yes TAKE ONE TABLET BY MOUTH EVERY DAY Rodney Clamp, MD Taking Active Self  rosuvastatin  (CRESTOR ) 5 MG tablet [id number] Yes TAKE ONE TABLET BY MOUTH ONCE DAILY. Wendel Hals, NP Taking Active Self  sitaGLIPtin  (JANUVIA ) 50 MG tablet [id number] Yes Take 1 tablet (50 mg total) by mouth daily. Wendel Hals, NP Taking Active Self  solifenacin  (VESICARE ) 10 MG tablet [id number] Yes TAKE (1) TABLET BY MOUTH ONCE DAILY. Trent Frizzle, MD Taking Active Self  traMADol  (ULTRAM ) 50 MG tablet [id number] Yes Take 0.5-1 tablets (25-50 mg total) by mouth every 12 (twelve) hours as needed.  Patient taking differently: Take 25-50 mg by mouth every 12 (twelve) hours as needed for moderate pain (pain score 4-6).   Rodney Clamp, MD Taking Active Self  valsartan (DIOVAN) 40 MG tablet [id number] Yes Take 40 mg by mouth daily. [provider] Taking Active Self            Home Care and Equipment/Supplies: Were Home Health Services Ordered?: No Any new equipment or medical supplies ordered?: No  Functional Questionnaire: Do you need assistance with bathing/showering or dressing?: No Do you need assistance with meal preparation?: No Do you need assistance with eating?: No Do you have difficulty maintaining continence: No Do you need assistance with getting out of bed/getting out of a chair/moving?: No  Do you have difficulty managing or taking your medications?: No  Follow up appointments reviewed: PCP Follow-up appointment confirmed?: Yes Date of PCP follow-up appointment?: 09/04/23 Follow-up  Provider: saw Dr. Daneil Dunker 09/04/23 Specialist Hospital Follow-up appointment confirmed?: Yes Date of Specialist follow-up appointment?: 01/27/24 Follow-Up Specialty Provider:: urology Dr. Joie Narrow Do you need transportation to your follow-up appointment?: No Do you understand care options if your condition(s) worsen?: Yes-patient verbalized understanding  SDOH Interventions Today    Flowsheet Row Most Recent Value  SDOH Interventions   Food Insecurity Interventions Intervention Not Indicated  Housing Interventions Intervention Not Indicated  Transportation Interventions Intervention Not Indicated  Utilities Interventions Intervention Not Indicated       Cecilie Coffee [facility], BSN RN Care Manager/ Transition of Care [place]/ [facility] [phone]

## 2023-09-04 NOTE — Assessment & Plan Note (Signed)
 On Vesicare  for urology.  Still having nocturnal awakenings however symptoms are overall manageable.  He will follow-up with urology to see him to discuss any other treatment options.

## 2023-09-04 NOTE — Patient Instructions (Signed)
 It was very nice to see you today!  I am glad that you are feeling back to normal.  Please let us  know if you have any recurrence of symptoms.  You can try using melatonin to help with sleep.  Will see you back this fall for your physical.  Please come back to see us  sooner if needed.  Return if symptoms worsen or fail to improve.   Take care, Dr Daneil Dunker  PLEASE NOTE:  If you had any lab tests, please let us  know if you have not heard back within a few days. You may see your results on mychart before we have a chance to review them but we will give you a call once they are reviewed by us .   If we ordered any referrals today, please let us  know if you have not heard from their office within the next week.   If you had any urgent prescriptions sent in today, please check with the pharmacy within an hour of our visit to make sure the prescription was transmitted appropriately.   Please try these tips to maintain a healthy lifestyle:  Eat at least 3 REAL meals and 1-2 snacks per day.  Aim for no more than 5 hours between eating.  If you eat breakfast, please do so within one hour of getting up.   Each meal should contain half fruits/vegetables, one quarter protein, and one quarter carbs (no bigger than a computer mouse)  Cut down on sweet beverages. This includes juice, soda, and sweet tea.   Drink at least 1 glass of water with each meal and aim for at least 8 glasses per day  Exercise at least 150 minutes every week.

## 2023-09-05 ENCOUNTER — Other Ambulatory Visit: Payer: Self-pay | Admitting: Nurse Practitioner

## 2023-09-05 DIAGNOSIS — E782 Mixed hyperlipidemia: Secondary | ICD-10-CM

## 2023-09-06 LAB — CULTURE, BLOOD (ROUTINE X 2)
Culture: NO GROWTH
Culture: NO GROWTH
Special Requests: ADEQUATE
Special Requests: ADEQUATE

## 2023-09-17 DIAGNOSIS — H02051 Trichiasis without entropian right upper eyelid: Secondary | ICD-10-CM | POA: Diagnosis not present

## 2023-09-17 DIAGNOSIS — H401131 Primary open-angle glaucoma, bilateral, mild stage: Secondary | ICD-10-CM | POA: Diagnosis not present

## 2023-09-23 ENCOUNTER — Other Ambulatory Visit: Payer: Self-pay | Admitting: Nurse Practitioner

## 2023-10-07 ENCOUNTER — Other Ambulatory Visit: Payer: Self-pay | Admitting: Family Medicine

## 2023-10-07 DIAGNOSIS — I10 Essential (primary) hypertension: Secondary | ICD-10-CM

## 2023-10-27 ENCOUNTER — Other Ambulatory Visit: Payer: Self-pay | Admitting: "Endocrinology

## 2023-11-14 ENCOUNTER — Encounter: Payer: Self-pay | Admitting: Advanced Practice Midwife

## 2023-11-23 ENCOUNTER — Other Ambulatory Visit: Payer: Self-pay | Admitting: Nurse Practitioner

## 2023-11-24 DIAGNOSIS — I10 Essential (primary) hypertension: Secondary | ICD-10-CM | POA: Diagnosis not present

## 2023-11-24 DIAGNOSIS — E211 Secondary hyperparathyroidism, not elsewhere classified: Secondary | ICD-10-CM | POA: Diagnosis not present

## 2023-11-24 DIAGNOSIS — R809 Proteinuria, unspecified: Secondary | ICD-10-CM | POA: Diagnosis not present

## 2023-11-24 DIAGNOSIS — N189 Chronic kidney disease, unspecified: Secondary | ICD-10-CM | POA: Diagnosis not present

## 2023-11-24 DIAGNOSIS — D649 Anemia, unspecified: Secondary | ICD-10-CM | POA: Diagnosis not present

## 2023-11-24 DIAGNOSIS — D631 Anemia in chronic kidney disease: Secondary | ICD-10-CM | POA: Diagnosis not present

## 2023-11-24 DIAGNOSIS — E119 Type 2 diabetes mellitus without complications: Secondary | ICD-10-CM | POA: Diagnosis not present

## 2023-11-27 DIAGNOSIS — Z961 Presence of intraocular lens: Secondary | ICD-10-CM | POA: Diagnosis not present

## 2023-11-27 DIAGNOSIS — H26493 Other secondary cataract, bilateral: Secondary | ICD-10-CM | POA: Diagnosis not present

## 2023-11-27 DIAGNOSIS — H401133 Primary open-angle glaucoma, bilateral, severe stage: Secondary | ICD-10-CM | POA: Diagnosis not present

## 2023-12-03 DIAGNOSIS — N2581 Secondary hyperparathyroidism of renal origin: Secondary | ICD-10-CM | POA: Diagnosis not present

## 2023-12-03 DIAGNOSIS — D638 Anemia in other chronic diseases classified elsewhere: Secondary | ICD-10-CM | POA: Diagnosis not present

## 2023-12-03 DIAGNOSIS — D696 Thrombocytopenia, unspecified: Secondary | ICD-10-CM | POA: Diagnosis not present

## 2023-12-03 DIAGNOSIS — N184 Chronic kidney disease, stage 4 (severe): Secondary | ICD-10-CM | POA: Diagnosis not present

## 2023-12-05 ENCOUNTER — Other Ambulatory Visit: Payer: Self-pay | Admitting: Nurse Practitioner

## 2023-12-12 ENCOUNTER — Other Ambulatory Visit: Payer: Self-pay | Admitting: *Deleted

## 2023-12-12 DIAGNOSIS — Z7984 Long term (current) use of oral hypoglycemic drugs: Secondary | ICD-10-CM

## 2023-12-12 DIAGNOSIS — E1121 Type 2 diabetes mellitus with diabetic nephropathy: Secondary | ICD-10-CM

## 2023-12-12 MED ORDER — GLIPIZIDE ER 5 MG PO TB24: 5.0000 mg | ORAL_TABLET | Freq: Every day | ORAL | 1 refills | Status: DC

## 2023-12-18 DIAGNOSIS — H401133 Primary open-angle glaucoma, bilateral, severe stage: Secondary | ICD-10-CM | POA: Diagnosis not present

## 2023-12-30 ENCOUNTER — Other Ambulatory Visit: Payer: Self-pay | Admitting: "Endocrinology

## 2024-01-06 ENCOUNTER — Ambulatory Visit: Admitting: Nurse Practitioner

## 2024-01-06 ENCOUNTER — Encounter: Payer: Self-pay | Admitting: Nurse Practitioner

## 2024-01-06 VITALS — BP 138/72 | HR 60 | Ht 72.0 in | Wt 174.6 lb

## 2024-01-06 DIAGNOSIS — Z7984 Long term (current) use of oral hypoglycemic drugs: Secondary | ICD-10-CM | POA: Diagnosis not present

## 2024-01-06 DIAGNOSIS — E782 Mixed hyperlipidemia: Secondary | ICD-10-CM | POA: Diagnosis not present

## 2024-01-06 DIAGNOSIS — I10 Essential (primary) hypertension: Secondary | ICD-10-CM

## 2024-01-06 DIAGNOSIS — E1159 Type 2 diabetes mellitus with other circulatory complications: Secondary | ICD-10-CM

## 2024-01-06 DIAGNOSIS — I152 Hypertension secondary to endocrine disorders: Secondary | ICD-10-CM | POA: Diagnosis not present

## 2024-01-06 DIAGNOSIS — E1121 Type 2 diabetes mellitus with diabetic nephropathy: Secondary | ICD-10-CM

## 2024-01-06 MED ORDER — SITAGLIPTIN PHOSPHATE 50 MG PO TABS: 50.0000 mg | ORAL_TABLET | Freq: Every day | ORAL | 3 refills | Status: AC

## 2024-01-06 MED ORDER — GLIPIZIDE ER 5 MG PO TB24: 5.0000 mg | ORAL_TABLET | Freq: Every day | ORAL | 3 refills | Status: AC

## 2024-01-06 MED ORDER — ROSUVASTATIN CALCIUM 5 MG PO TABS: 5.0000 mg | ORAL_TABLET | Freq: Every day | ORAL | 3 refills | Status: AC

## 2024-01-06 NOTE — Progress Notes (Signed)
 01/06/2024, 2:56 PM   Endocrinology follow-up note  Subjective:    Patient ID: Richard Swanson., male    DOB: 1935-02-16.  Richard Swanson. is being seen in follow-up after he was seen in consultation for management of currently uncontrolled symptomatic diabetes requested by  Kennyth Worth HERO, MD.   Past Medical History:  Diagnosis Date   Cancer [facility])    Prostate   Chronic obstructive pulmonary disease, unspecified (HCC)    Diabetes mellitus without complication (HCC)    Dizziness    Gastro-esophageal reflux disease without esophagitis    Glaucoma    Glomerular disorders in diseases classified elsewhere    Gross hematuria    Hypertensive chronic kidney disease with stage 1 through stage 4 chronic kidney disease, or unspecified chronic kidney disease    Kidney disease, chronic, stage III (moderate, EGFR 30-59 ml/min) (HCC)    Malignant neoplasm of prostate (HCC)    Prostate cancer (HCC) 11/03/2010   dx 2006   Type 2 diabetes mellitus with diabetic nephropathy (HCC)    Unspecified kidney failure     Past Surgical History:  Procedure Laterality Date   PROSTATE SURGERY      Social History   Socioeconomic History   Marital status: Married    Spouse name: Not on file   Number of children: Not on file   Years of education: Not on file   Highest education level: Not on file  Occupational History   Occupation: Retired  Tobacco Use   Smoking status: Never   Smokeless tobacco: Never  Vaping Use   Vaping status: Never Used  Substance and Sexual Activity   Alcohol use: No   Drug use: No   Sexual activity: Not on file  Other Topics Concern   Not on file  Social History Narrative   Not on file   Social Drivers of Health   Financial Resource Strain: Low Risk  (05/12/2023)   Overall Financial Resource Strain (CARDIA)    Difficulty of Paying Living Expenses: Not hard at all  Food Insecurity:  No Food Insecurity (09/04/2023)   Hunger Vital Sign    Worried About Running Out of Food in the Last Year: Never true    Ran Out of Food in the Last Year: Never true  Transportation Needs: No Transportation Needs (09/04/2023)   PRAPARE - Administrator, Civil Service (Medical): No    Lack of Transportation (Non-Medical): No  Physical Activity: Sufficiently Active (05/12/2023)   Exercise Vital Sign    Days of Exercise per Week: 3 days    Minutes of Exercise per Session: 60 min  Stress: No Stress Concern Present (05/12/2023)   [employer] of Occupational Health - Occupational Stress Questionnaire    Feeling of Stress : Not at all  Social Connections: Socially Integrated (08/31/2023)   Social Connection and Isolation Panel    Frequency of Communication with Friends and Family: Three times a week    Frequency of Social Gatherings with Friends and Family: Three times a week    Attends Religious Services: More than 4 times per year    Active Member of Clubs or Organizations: Yes  Attends Banker Meetings: 1 to 4 times per year    Marital Status: Married    Family History  Problem Relation Age of Onset   Stroke Father     Outpatient Encounter Medications as of 01/06/2024  Medication Sig   amLODipine  (NORVASC ) 10 MG tablet Take 1 tablet (10 mg total) by mouth daily.   brimonidine  (ALPHAGAN ) 0.2 % ophthalmic solution Place 1 drop into both eyes daily.   docusate sodium  (COLACE) 100 MG capsule Take 1 capsule (100 mg total) by mouth 2 (two) times daily.   ferrous sulfate  325 (65 FE) MG EC tablet Take 1 tablet (325 mg total) by mouth 2 (two) times daily.   latanoprost  (XALATAN ) 0.005 % ophthalmic solution Place 1 drop into both eyes at bedtime.   metoprolol  succinate (TOPROL -XL) 25 MG 24 hr tablet TAKE ONE TABLET BY MOUTH EVERY DAY   solifenacin  (VESICARE ) 10 MG tablet TAKE (1) TABLET BY MOUTH ONCE DAILY.   traMADol  (ULTRAM ) 50 MG tablet Take 0.5-1 tablets (25-50 mg  total) by mouth every 12 (twelve) hours as needed. (Patient taking differently: Take 25-50 mg by mouth every 12 (twelve) hours as needed for moderate pain (pain score 4-6).)   valsartan (DIOVAN) 40 MG tablet Take 40 mg by mouth daily.   [DISCONTINUED] glipiZIDE  (GLUCOTROL  XL) 5 MG 24 hr tablet Take 1 tablet (5 mg total) by mouth daily with breakfast.   [DISCONTINUED] sitaGLIPtin  (JANUVIA ) 50 MG tablet Take 1 tablet (50 mg total) by mouth daily.   glipiZIDE  (GLUCOTROL  XL) 5 MG 24 hr tablet Take 1 tablet (5 mg total) by mouth daily with breakfast.   rosuvastatin  (CRESTOR ) 5 MG tablet Take 1 tablet (5 mg total) by mouth daily.   sitaGLIPtin  (JANUVIA ) 50 MG tablet Take 1 tablet (50 mg total) by mouth daily.   [DISCONTINUED] rosuvastatin  (CRESTOR ) 5 MG tablet TAKE ONE TABLET BY MOUTH ONCE DAILY. (Patient not taking: Reported on 01/06/2024)   No facility-administered encounter medications on file as of 01/06/2024.    ALLERGIES: No Known Allergies  VACCINATION STATUS: Immunization History  Administered Date(s) Administered   Fluad Quad(high Dose 65+) 02/14/2020, 02/20/2021, 02/25/2022   Fluad Trivalent(High Dose 65+) 02/24/2023   INFLUENZA, HIGH DOSE SEASONAL PF 03/23/2018   Influenza,inj,Quad PF,6+ Mos 01/30/2016, 01/29/2017, 01/28/2019   Influenza-Unspecified 12/28/2020   Moderna SARS-COV2 Booster Vaccination 03/16/2020   Moderna Sars-Covid-2 Vaccination 06/05/2019, 07/06/2019   Pneumococcal Conjugate-13 04/06/2020   Pneumococcal Polysaccharide-23 01/28/2019    Diabetes He presents for his follow-up diabetic visit. He has type 2 diabetes mellitus. Onset time: He was diagnosed at approximate age of 26 years. His disease course has been stable. There are no hypoglycemic associated symptoms. Pertinent negatives for hypoglycemia include no confusion, pallor or seizures. Pertinent negatives for diabetes include no fatigue, no polydipsia, no polyphagia, no polyuria, no weakness and no weight loss.  There are no hypoglycemic complications. Symptoms are stable. Diabetic complications include nephropathy and peripheral neuropathy. Risk factors for coronary artery disease include dyslipidemia, diabetes mellitus, hypertension, male sex and sedentary lifestyle. Current diabetic treatment includes oral agent (dual therapy). He is compliant with treatment most of the time. His weight is fluctuating minimally. He is following a generally healthy diet. When asked about meal planning, he reported none. He has not had a previous visit with a dietitian. He rarely participates in exercise. His home blood glucose trend is fluctuating minimally. His overall blood glucose range is 110-130 mg/dl. (He presents today with his logs showing mostly at goal glycemic  profile.  His POCT A1c today is 6.3%, increasing from last visit of 5.8%.  He denies any hypoglycemia or symptoms of such.  ) An ACE inhibitor/angiotensin II receptor blocker is being taken. He does not see a podiatrist.Eye exam is current.  Hyperlipidemia This is a chronic problem. The current episode started more than 1 year ago. The problem is controlled. Recent lipid tests were reviewed and are normal. Exacerbating diseases include diabetes. Factors aggravating his hyperlipidemia include beta blockers. Pertinent negatives include no myalgias. Current antihyperlipidemic treatment includes statins. The current treatment provides mild improvement of lipids. There are no compliance problems.  Risk factors for coronary artery disease include dyslipidemia, diabetes mellitus, hypertension, male sex and a sedentary lifestyle.     Review of systems  Constitutional: + increasing body weight,  current Body mass index is 23.68 kg/m. , no fatigue, no subjective hyperthermia, no subjective hypothermia Eyes: no blurry vision, no xerophthalmia ENT: no sore throat, no nodules palpated in throat, no dysphagia/odynophagia, no hoarseness Cardiovascular: no chest pain, no  shortness of breath, no palpitations Respiratory: no cough, no shortness of breath Gastrointestinal: no nausea/vomiting/diarrhea Musculoskeletal: no muscle/joint aches Skin: no rashes, no hyperemia Neurological: no tremors, no numbness, no tingling, no dizziness Psychiatric: no depression, no anxiety   Objective:    BP 138/72 (BP Location: Left Arm, Patient Position: Sitting, Cuff Size: Large)   Pulse 60   Ht 6' (1.829 m)   Wt 174 lb 9.6 oz (79.2 kg)   BMI 23.68 kg/m   Wt Readings from Last 3 Encounters:  01/06/24 174 lb 9.6 oz (79.2 kg)  09/04/23 169 lb 12.8 oz (77 kg)  08/30/23 162 lb 14.7 oz (73.9 kg)    BP Readings from Last 3 Encounters:  01/06/24 138/72  09/04/23 118/70  09/02/23 139/63     Physical Exam- Limited  Constitutional:  Body mass index is 23.68 kg/m. , not in acute distress, normal state of mind Eyes:  EOMI, no exophthalmos Musculoskeletal: no gross deformities, strength intact in all four extremities, no gross restriction of joint movements Skin:  no rashes, no hyperemia Neurological: no tremor with outstretched hands   Diabetic Foot Exam - Simple   Simple Foot Form Diabetic Foot exam was performed with the following findings: Yes 01/06/2024  2:46 PM  Visual Inspection No deformities, no ulcerations, no other skin breakdown bilaterally: Yes Sensation Testing Intact to touch and monofilament testing bilaterally: Yes Pulse Check Posterior Tibialis and Dorsalis pulse intact bilaterally: Yes Comments Onychomycosis bilaterally L>R     CMP     Component Value Date/Time   NA 136 09/02/2023 0514   NA 140 05/14/2019 0000   K 3.5 09/02/2023 0514   CL 105 09/02/2023 0514   CO2 24 09/02/2023 0514   GLUCOSE 146 (H) 09/02/2023 0514   BUN 23 09/02/2023 0514   BUN 26 (A) 05/14/2019 0000   CREATININE 1.84 (H) 09/02/2023 0514   CREATININE 2.23 (H) 04/06/2020 1206   CALCIUM  7.8 (L) 09/02/2023 0514   PROT 8.2 (H) 08/30/2023 1800   ALBUMIN 3.9 08/30/2023  1800   AST 32 08/30/2023 1800   ALT 30 08/30/2023 1800   ALKPHOS 58 08/30/2023 1800   BILITOT 1.3 (H) 08/30/2023 1800   GFRNONAA 35 (L) 09/02/2023 0514   GFRAA 29 (L) 07/29/2019 1115    Diabetic Labs (most recent): Lab Results  Component Value Date   HGBA1C 5.8 (A) 08/28/2023   HGBA1C 6.8 (A) 01/22/2023   HGBA1C 7.7 (A) 09/19/2022  Lipid Panel ( most recent) Lipid Panel     Component Value Date/Time   CHOL 120 11/27/2022 0753   TRIG 74.0 11/27/2022 0753   HDL 46.10 11/27/2022 0753   CHOLHDL 3 11/27/2022 0753   VLDL 14.8 11/27/2022 0753   LDLCALC 59 11/27/2022 0753   LDLCALC 59 04/06/2020 1206      Lab Results  Component Value Date   TSH 1.65 11/27/2022   TSH 1.09 05/03/2021   TSH 0.49 04/06/2020   TSH 1.48 05/05/2017      Assessment & Plan:   1) Type 2 diabetes mellitus with diabetic nephropathy, without long-term current use of insulin  (HCC)  - Richard Swanson. has currently uncontrolled symptomatic type 2 DM since 89 years of age.  He presents today with his logs showing mostly at goal glycemic profile.  His POCT A1c today is 6.3%, increasing from last visit of 5.8%.  He denies any hypoglycemia or symptoms of such.    - Recent labs reviewed.  - I had a long discussion with him about the progressive nature of diabetes and the pathology behind its complications. -his diabetes is complicated by CKD and he remains at a high risk for more acute and chronic complications which include CAD, CVA, CKD, retinopathy, and neuropathy. These are all discussed in detail with him.  - Nutritional counseling repeated at each appointment due to patients tendency to fall back in to old habits.  - The patient admits there is a room for improvement in their diet and drink choices. -  Suggestion is made for the patient to avoid simple carbohydrates from their diet including Cakes, Sweet Desserts / Pastries, Ice Cream, Soda (diet and regular), Sweet Tea, Candies, Chips,  Cookies, Sweet Pastries, Store Bought Juices, Alcohol in Excess of 1-2 drinks a day, Artificial Sweeteners, Coffee Creamer, and Sugar-free Products. This will help patient to have stable blood glucose profile and potentially avoid unintended weight gain.   - I encouraged the patient to switch to unprocessed or minimally processed complex starch and increased protein intake (animal or plant source), fruits, and vegetables.   - Patient is advised to stick to a routine mealtimes to eat 3 meals a day and avoid unnecessary snacks (to snack only to correct hypoglycemia).  - he sees Santana Duke, RDE routinely for diabetes education and meal planning.  - I have approached him with the following individualized plan to manage  his diabetes and patient agrees:   -Due to stable glycemic profile and lack of hypoglycemia, no changes will be made to his medications at this time.  He is advised to continue Januvia  50 mg po daily and Glipizide  5 mg XL daily with breakfast.    -He is encouraged to continue monitoring blood glucose once daily, before breakfast and to call the clinic if he has readings less than 70 or greater than 200 for 3 tests in a row.  - he is not a candidate for metformin, SGLT2 inhibitors, or full dose Januvia  due to concurrent renal insufficiency.  - Specific targets for  A1c;  LDL, HDL,  and Triglycerides were discussed with the patient.  2) Blood Pressure /Hypertension:  His blood pressure is controlled to target for his age. He is advised to continue meds as prescribed by PCP/nephrology.  3) Lipids/Hyperlipidemia:   His most recent lipid panel from 11/27/22 shows controlled LDL of 59.  He is advised to continue Crestor  5 mg po daily at bedtime.  Side effects and precautions discussed with him.  4)  Weight/Diet: His Body mass index is 23.68 kg/m.  -  he is not a candidate for weight loss.  Exercise, and detailed carbohydrates information provided  -  detailed on discharge  instructions.  5) Chronic Care/Health Maintenance: -he  is on ACEI/ARB and Statin medications and is encouraged to initiate and continue to follow up with Ophthalmology, Dentist, Nephrologist, and Podiatrist at least yearly or according to recommendations, and advised to stay away from smoking. I have recommended yearly flu vaccine and pneumonia vaccine at least every 5 years; moderate intensity exercise for up to 150 minutes weekly; and  sleep for at least 7 hours a day.  - he is  advised to maintain close follow up with Kennyth Worth HERO, MD for primary care needs, as well as his other providers for optimal and coordinated care.       I spent  23  minutes in the care of the patient today including review of labs from CMP, Lipids, Thyroid  Function, Hematology (current and previous including abstractions from other facilities); face-to-face time discussing  his blood glucose readings/logs, discussing hypoglycemia and hyperglycemia episodes and symptoms, medications doses, his options of short and long term treatment based on the latest standards of care / guidelines;  discussion about incorporating lifestyle medicine;  and documenting the encounter. Risk reduction counseling performed per USPSTF guidelines to reduce obesity and cardiovascular risk factors.     Please refer to Patient Instructions for Blood Glucose Monitoring and Insulin /Medications Dosing Guide  in media tab for additional information. Please  also refer to  Patient Self Inventory in the Media  tab for reviewed elements of pertinent patient history.  Richard Swanson. participated in the discussions, expressed understanding, and voiced agreement with the above plans.  All questions were answered to his satisfaction. he is encouraged to contact clinic should he have any questions or concerns prior to his return visit.   Follow up plan: - Return in about 4 months (around 05/07/2024) for Diabetes F/U with A1c in office, No previsit  labs, Bring meter and logs.  Benton Rio, [facility] [address] [place], [place] [address] Phone: [phone] Fax: [phone]   01/06/2024, 2:56 PM

## 2024-01-13 ENCOUNTER — Other Ambulatory Visit: Payer: Self-pay | Admitting: Family Medicine

## 2024-01-13 DIAGNOSIS — I10 Essential (primary) hypertension: Secondary | ICD-10-CM

## 2024-01-22 DIAGNOSIS — H26493 Other secondary cataract, bilateral: Secondary | ICD-10-CM | POA: Diagnosis not present

## 2024-01-22 DIAGNOSIS — Z961 Presence of intraocular lens: Secondary | ICD-10-CM | POA: Diagnosis not present

## 2024-01-22 DIAGNOSIS — H401133 Primary open-angle glaucoma, bilateral, severe stage: Secondary | ICD-10-CM | POA: Diagnosis not present

## 2024-01-26 NOTE — Progress Notes (Deleted)
 Impression/Assessment:  1.  Acute cystitis, resolved  2.  History of prostate cancer, 28 years out from radical prostatectomy with no evidence of recurrence  3.  Lower urinary tract symptoms with urgency incontinence day and nighttime (worse at night)  Plan:    History of Present Illness: Here for f/u of recurrent cystitis w/ concurrent LUTS.  No recent gross hematuria or dysuria.  Still on Solifenacin .  He wonders if he can stop this.   Past Medical History:  Diagnosis Date   Cancer [facility])    Prostate   Chronic obstructive pulmonary disease, unspecified (HCC)    Diabetes mellitus without complication (HCC)    Dizziness    Gastro-esophageal reflux disease without esophagitis    Glaucoma    Glomerular disorders in diseases classified elsewhere    Gross hematuria    Hypertensive chronic kidney disease with stage 1 through stage 4 chronic kidney disease, or unspecified chronic kidney disease    Kidney disease, chronic, stage III (moderate, EGFR 30-59 ml/min) (HCC)    Malignant neoplasm of prostate (HCC)    Prostate cancer (HCC) 11/03/2010   dx 2006   Type 2 diabetes mellitus with diabetic nephropathy (HCC)    Unspecified kidney failure     Past Surgical History:  Procedure Laterality Date   PROSTATE SURGERY      Home Medications:  Allergies as of 01/27/2024   No Known Allergies      Medication List        Accurate as of January 26, 2024  7:06 AM. If you have any questions, ask your nurse or doctor.          amLODipine  10 MG tablet Commonly known as: NORVASC  Take 1 tablet (10 mg total) by mouth daily.   brimonidine  0.2 % ophthalmic solution Commonly known as: ALPHAGAN  Place 1 drop into both eyes daily.   docusate sodium  100 MG capsule Commonly known as: Colace Take 1 capsule (100 mg total) by mouth 2 (two) times daily.   ferrous sulfate  325 (65 FE) MG EC tablet Take 1 tablet (325 mg total) by mouth 2 (two) times daily.   glipiZIDE  5 MG 24 hr  tablet Commonly known as: GLUCOTROL  XL Take 1 tablet (5 mg total) by mouth daily with breakfast.   latanoprost  0.005 % ophthalmic solution Commonly known as: XALATAN  Place 1 drop into both eyes at bedtime.   metoprolol  succinate 25 MG 24 hr tablet Commonly known as: TOPROL -XL TAKE ONE TABLET BY MOUTH EVERY DAY   rosuvastatin  5 MG tablet Commonly known as: CRESTOR  Take 1 tablet (5 mg total) by mouth daily.   sitaGLIPtin  50 MG tablet Commonly known as: Januvia  Take 1 tablet (50 mg total) by mouth daily.   solifenacin  10 MG tablet Commonly known as: VESICARE  TAKE (1) TABLET BY MOUTH ONCE DAILY.   traMADol  50 MG tablet Commonly known as: ULTRAM  Take 0.5-1 tablets (25-50 mg total) by mouth every 12 (twelve) hours as needed. What changed: reasons to take this   valsartan 40 MG tablet Commonly known as: DIOVAN Take 40 mg by mouth daily.        Allergies: No Known Allergies  Family History  Problem Relation Age of Onset   Stroke Father     Social History:  reports that he has never smoked. He has never used smokeless tobacco. He reports that he does not drink alcohol and does not use drugs.  ROS: A complete review of systems was performed.  All systems are negative except for  pertinent findings as noted.  Physical Exam:  Vital signs in last 24 hours: There were no vitals taken for this visit. Constitutional:  Alert and oriented, No acute distress Cardiovascular: Regular rate  Respiratory: Normal respiratory effort Neurologic: Grossly intact, no focal deficits Psychiatric: Normal mood and affect  I have reviewed prior pt notes  I have reviewed urinalysis results  I have independently reviewed prior imaging  I have reviewed prior urine culture

## 2024-01-27 ENCOUNTER — Ambulatory Visit: Payer: Medicare HMO | Admitting: Urology

## 2024-01-27 DIAGNOSIS — R3915 Urgency of urination: Secondary | ICD-10-CM

## 2024-01-27 DIAGNOSIS — Z8546 Personal history of malignant neoplasm of prostate: Secondary | ICD-10-CM

## 2024-01-27 DIAGNOSIS — R350 Frequency of micturition: Secondary | ICD-10-CM

## 2024-01-29 DIAGNOSIS — Z961 Presence of intraocular lens: Secondary | ICD-10-CM | POA: Diagnosis not present

## 2024-01-29 DIAGNOSIS — E119 Type 2 diabetes mellitus without complications: Secondary | ICD-10-CM | POA: Diagnosis not present

## 2024-01-29 DIAGNOSIS — H401133 Primary open-angle glaucoma, bilateral, severe stage: Secondary | ICD-10-CM | POA: Diagnosis not present

## 2024-01-29 LAB — OPHTHALMOLOGY REPORT-SCANNED

## 2024-02-03 DIAGNOSIS — Z941 Heart transplant status: Secondary | ICD-10-CM | POA: Diagnosis not present

## 2024-02-03 DIAGNOSIS — K219 Gastro-esophageal reflux disease without esophagitis: Secondary | ICD-10-CM | POA: Diagnosis not present

## 2024-02-03 DIAGNOSIS — Z7984 Long term (current) use of oral hypoglycemic drugs: Secondary | ICD-10-CM | POA: Diagnosis not present

## 2024-02-03 DIAGNOSIS — Z9842 Cataract extraction status, left eye: Secondary | ICD-10-CM | POA: Diagnosis not present

## 2024-02-03 DIAGNOSIS — Z961 Presence of intraocular lens: Secondary | ICD-10-CM | POA: Diagnosis not present

## 2024-02-03 DIAGNOSIS — I129 Hypertensive chronic kidney disease with stage 1 through stage 4 chronic kidney disease, or unspecified chronic kidney disease: Secondary | ICD-10-CM | POA: Diagnosis not present

## 2024-02-03 DIAGNOSIS — E119 Type 2 diabetes mellitus without complications: Secondary | ICD-10-CM | POA: Diagnosis not present

## 2024-02-03 DIAGNOSIS — N189 Chronic kidney disease, unspecified: Secondary | ICD-10-CM | POA: Diagnosis not present

## 2024-02-03 DIAGNOSIS — H401113 Primary open-angle glaucoma, right eye, severe stage: Secondary | ICD-10-CM | POA: Diagnosis not present

## 2024-02-04 DIAGNOSIS — E119 Type 2 diabetes mellitus without complications: Secondary | ICD-10-CM | POA: Diagnosis not present

## 2024-02-04 DIAGNOSIS — Z961 Presence of intraocular lens: Secondary | ICD-10-CM | POA: Diagnosis not present

## 2024-02-04 DIAGNOSIS — H401133 Primary open-angle glaucoma, bilateral, severe stage: Secondary | ICD-10-CM | POA: Diagnosis not present

## 2024-02-06 ENCOUNTER — Other Ambulatory Visit: Payer: Self-pay | Admitting: Family Medicine

## 2024-02-13 DIAGNOSIS — H401133 Primary open-angle glaucoma, bilateral, severe stage: Secondary | ICD-10-CM | POA: Diagnosis not present

## 2024-02-17 LAB — OPHTHALMOLOGY REPORT-SCANNED

## 2024-02-23 ENCOUNTER — Inpatient Hospital Stay: Payer: Medicare HMO | Attending: Oncology

## 2024-02-23 DIAGNOSIS — Z8546 Personal history of malignant neoplasm of prostate: Secondary | ICD-10-CM | POA: Insufficient documentation

## 2024-02-23 DIAGNOSIS — D509 Iron deficiency anemia, unspecified: Secondary | ICD-10-CM | POA: Diagnosis not present

## 2024-02-23 DIAGNOSIS — N189 Chronic kidney disease, unspecified: Secondary | ICD-10-CM | POA: Diagnosis not present

## 2024-02-23 DIAGNOSIS — C61 Malignant neoplasm of prostate: Secondary | ICD-10-CM

## 2024-02-23 LAB — CBC WITH DIFFERENTIAL/PLATELET
Abs Immature Granulocytes: 0.03 K/uL (ref 0.00–0.07)
Basophils Absolute: 0.1 K/uL (ref 0.0–0.1)
Basophils Relative: 1 %
Eosinophils Absolute: 0.1 K/uL (ref 0.0–0.5)
Eosinophils Relative: 2 %
HCT: 36.3 % — ABNORMAL LOW (ref 39.0–52.0)
Hemoglobin: 12.7 g/dL — ABNORMAL LOW (ref 13.0–17.0)
Immature Granulocytes: 0 %
Lymphocytes Relative: 21 %
Lymphs Abs: 1.5 K/uL (ref 0.7–4.0)
MCH: 28.2 pg (ref 26.0–34.0)
MCHC: 35 g/dL (ref 30.0–36.0)
MCV: 80.7 fL (ref 80.0–100.0)
Monocytes Absolute: 0.5 K/uL (ref 0.1–1.0)
Monocytes Relative: 7 %
Neutro Abs: 4.8 K/uL (ref 1.7–7.7)
Neutrophils Relative %: 69 %
Platelets: 157 K/uL (ref 150–400)
RBC: 4.5 MIL/uL (ref 4.22–5.81)
RDW: 13.2 % (ref 11.5–15.5)
WBC: 7 K/uL (ref 4.0–10.5)
nRBC: 0 % (ref 0.0–0.2)

## 2024-02-23 LAB — IRON AND TIBC
Iron: 86 ug/dL (ref 45–182)
Saturation Ratios: 33 % (ref 17.9–39.5)
TIBC: 259 ug/dL (ref 250–450)
UIBC: 173 ug/dL

## 2024-02-23 LAB — PSA: Prostatic Specific Antigen: <0.02 ng/mL (ref 0.00–4.00)

## 2024-02-23 LAB — FERRITIN: Ferritin: 269 ng/mL (ref 24–336)

## 2024-02-23 LAB — FOLATE: Folate: 11.6 ng/mL (ref >5.9)

## 2024-02-23 LAB — VITAMIN B12: Vitamin B-12: 1317 pg/mL — ABNORMAL HIGH (ref 180–914)

## 2024-02-27 DIAGNOSIS — E119 Type 2 diabetes mellitus without complications: Secondary | ICD-10-CM | POA: Diagnosis not present

## 2024-02-27 DIAGNOSIS — Z961 Presence of intraocular lens: Secondary | ICD-10-CM | POA: Diagnosis not present

## 2024-02-27 DIAGNOSIS — Z9889 Other specified postprocedural states: Secondary | ICD-10-CM | POA: Diagnosis not present

## 2024-02-27 DIAGNOSIS — H401133 Primary open-angle glaucoma, bilateral, severe stage: Secondary | ICD-10-CM | POA: Diagnosis not present

## 2024-03-01 ENCOUNTER — Ambulatory Visit: Payer: Medicare HMO | Admitting: Hematology

## 2024-03-01 ENCOUNTER — Encounter: Admitting: Family Medicine

## 2024-03-02 ENCOUNTER — Inpatient Hospital Stay: Attending: Oncology | Admitting: Oncology

## 2024-03-02 DIAGNOSIS — N184 Chronic kidney disease, stage 4 (severe): Secondary | ICD-10-CM | POA: Diagnosis not present

## 2024-03-02 DIAGNOSIS — D509 Iron deficiency anemia, unspecified: Secondary | ICD-10-CM | POA: Insufficient documentation

## 2024-03-02 DIAGNOSIS — Z23 Encounter for immunization: Secondary | ICD-10-CM | POA: Insufficient documentation

## 2024-03-02 DIAGNOSIS — C61 Malignant neoplasm of prostate: Secondary | ICD-10-CM

## 2024-03-02 DIAGNOSIS — Z8546 Personal history of malignant neoplasm of prostate: Secondary | ICD-10-CM | POA: Diagnosis not present

## 2024-03-02 MED ORDER — INFLUENZA VAC SPLIT HIGH-DOSE 0.5 ML IM SUSY
0.5000 mL | PREFILLED_SYRINGE | Freq: Once | INTRAMUSCULAR | Status: AC
  Administered 2024-03-02: 0.5 mL via INTRAMUSCULAR
  Filled 2024-03-02: qty 0.5

## 2024-03-02 NOTE — Progress Notes (Signed)
 Commend  [facility] [address], [place] [address]   CLINIC:  Medical Oncology/Hematology  PCP:  Kennyth Worth HERO, MD [address] / [place] [address] [phone]   REASON FOR VISIT:  Follow-up for prostate cancer  PRIOR THERAPY:  1. Radical prostatectomy in 06/2004. 2. Radiation from 08/01/2010 to 09/14/2010.  NGS Results: not done  CURRENT THERAPY: surveillance  BRIEF ONCOLOGIC HISTORY:  Oncology History   No history exists.    CANCER STAGING: Uncommon  Cancer Staging  No matching staging information was found for the patient.  INTERVAL HISTORY:  Mr. Levante Simones., a 89 y.o. male, is seen for follow-up of iron deficiency anemia in the setting of CKD and history of prostate cancer.  Denies any new onset pains.  Reports appetite and energy levels of 100%.  Reports he was hospitalized for UTI at [facility] about 4 to 5 months ago.  No recurrent UTI since.  Patient has history of glaucoma and had surgery for right eye 2 weeks ago and is having his left done next week.  Reports tolerating procedure well.  Denies any new concerns.  REVIEW OF SYSTEMS:  Review of Systems  Constitutional:  Negative for appetite change.  Cardiovascular:  Negative for leg swelling.  Gastrointestinal:  Negative for blood in stool.  Genitourinary:  Negative for hematuria.   All other systems reviewed and are negative.   PAST MEDICAL/SURGICAL HISTORY:  Past Medical History:  Diagnosis Date   Cancer [facility])    Prostate   Chronic obstructive pulmonary disease, unspecified (HCC)    Diabetes mellitus without complication (HCC)    Dizziness    Gastro-esophageal reflux disease without esophagitis    Glaucoma    Glomerular disorders in diseases classified elsewhere    Gross hematuria    Hypertensive chronic kidney disease with stage 1 through stage 4 chronic kidney disease, or unspecified chronic kidney disease    Kidney disease, chronic, stage III (moderate,  EGFR 30-59 ml/min) (HCC)    Malignant neoplasm of prostate (HCC)    Prostate cancer (HCC) 11/03/2010   dx 2006   Type 2 diabetes mellitus with diabetic nephropathy (HCC)    Unspecified kidney failure    Past Surgical History:  Procedure Laterality Date   PROSTATE SURGERY      SOCIAL HISTORY:  Social History   Socioeconomic History   Marital status: Married    Spouse name: Not on file   Number of children: Not on file   Years of education: Not on file   Highest education level: Not on file  Occupational History   Occupation: Retired  Tobacco Use   Smoking status: Never   Smokeless tobacco: Never  Vaping Use   Vaping status: Never Used  Substance and Sexual Activity   Alcohol use: No   Drug use: No   Sexual activity: Not on file  Other Topics Concern   Not on file  Social History Narrative   Not on file   Social Drivers of Health   Financial Resource Strain: Low Risk  (05/12/2023)   Overall Financial Resource Strain (CARDIA)    Difficulty of Paying Living Expenses: Not hard at all  Food Insecurity: No Food Insecurity (02/03/2024)   [facility] from [facility]   Hunger Vital Sign    Within the past 12 months, you worried that your food would run out before you got the money to buy more.: Never true    Within the past 12 months, the food  you bought just didn't last and you didn't have money to get more.: Never true  Transportation Needs: No Transportation Needs (02/03/2024)   Received from [facility] - Transportation    In the past 12 months, has lack of transportation kept you from medical appointments or from getting medications?: No    In the past 12 months, has lack of transportation kept you from meetings, work, or from getting things needed for daily living?: No  Physical Activity: Sufficiently Active (05/12/2023)   Exercise Vital Sign    Days of Exercise per Week: 3 days    Minutes of Exercise per Session: 60 min  Stress: No Stress Concern Present  (05/12/2023)   [employer] - Occupational Stress Questionnaire    Feeling of Stress : Not at all  Social Connections: Socially Integrated (08/31/2023)   Social Connection and Isolation Panel    Frequency of Communication with Friends and Family: Three times a week    Frequency of Social Gatherings with Friends and Family: Three times a week    Attends Religious Services: More than 4 times per year    Active Member of Clubs or Organizations: Yes    Attends Banker Meetings: 1 to 4 times per year    Marital Status: Married  Catering Manager Violence: Not At Risk (09/04/2023)   Humiliation, Afraid, Rape, and Kick questionnaire    Fear of Current or Ex-Partner: No    Emotionally Abused: No    Physically Abused: No    Sexually Abused: No    FAMILY HISTORY:  Family History  Problem Relation Age of Onset   Stroke Father     CURRENT MEDICATIONS:  Current Outpatient Medications  Medication Sig Dispense Refill   acetaZOLAMIDE (DIAMOX) 250 MG tablet Take 250 mg by mouth.     amLODipine  (NORVASC ) 10 MG tablet Take 1 tablet (10 mg total) by mouth daily. 90 tablet 3   atropine 1 % ophthalmic solution 1 drop.     brimonidine  (ALPHAGAN ) 0.2 % ophthalmic solution Place 1 drop into both eyes daily.     docusate sodium  (COLACE) 100 MG capsule Take 1 capsule (100 mg total) by mouth 2 (two) times daily. 60 capsule 2   dorzolamide-timolol (COSOPT) 2-0.5 % ophthalmic solution Apply 1 drop to eye.     ferrous sulfate  325 (65 FE) MG EC tablet Take 1 tablet (325 mg total) by mouth 2 (two) times daily. 60 tablet 3   glipiZIDE  (GLUCOTROL  XL) 5 MG 24 hr tablet Take 1 tablet (5 mg total) by mouth daily with breakfast. 90 tablet 3   latanoprost  (XALATAN ) 0.005 % ophthalmic solution Place 1 drop into both eyes at bedtime.     Latanoprostene Bunod (VYZULTA) 0.024 % SOLN Apply 1 drop to eye.     metoprolol  succinate (TOPROL -XL) 25 MG 24 hr tablet Take 25 mg by mouth.      moxifloxacin (VIGAMOX) 0.5 % ophthalmic solution Insert one drop 3 times a day into right eye starting 4 days prior to surgery     rosuvastatin  (CRESTOR ) 5 MG tablet Take 1 tablet (5 mg total) by mouth daily. 90 tablet 3   sitaGLIPtin  (JANUVIA ) 50 MG tablet Take 1 tablet (50 mg total) by mouth daily. 90 tablet 3   solifenacin  (VESICARE ) 10 MG tablet TAKE (1) TABLET BY MOUTH ONCE DAILY. 30 tablet 11   traMADol  (ULTRAM ) 50 MG tablet Take 0.5-1 tablets (25-50 mg total) by mouth every 12 (twelve) hours  as needed for moderate pain (pain score 4-6). 30 tablet 5   valsartan (DIOVAN) 40 MG tablet Take 40 mg by mouth daily.     No current facility-administered medications for this visit.    ALLERGIES:  No Known Allergies  PHYSICAL EXAM:  Performance status (ECOG): 1 - Symptomatic but completely ambulatory  Vitals:   03/02/24 1413 03/02/24 1421  BP: (!) 150/62 (!) 147/53  Pulse: (!) 51   Resp: 17   Temp: (!) 97.1 F (36.2 C)   SpO2: 100%     Wt Readings from Last 3 Encounters:  03/02/24 169 lb (76.7 kg)  01/06/24 174 lb 9.6 oz (79.2 kg)  09/04/23 169 lb 12.8 oz (77 kg)   Physical Exam Vitals reviewed.  Constitutional:      Appearance: Normal appearance.  Cardiovascular:     Rate and Rhythm: Normal rate and regular rhythm.     Pulses: Normal pulses.     Heart sounds: Normal heart sounds.  Pulmonary:     Effort: Pulmonary effort is normal.     Breath sounds: Normal breath sounds.  Neurological:     General: No focal deficit present.     Mental Status: He is alert and oriented to person, place, and time.  Psychiatric:        Mood and Affect: Mood normal.        Behavior: Behavior normal.      LABORATORY DATA:  I have reviewed the labs as listed.     Latest Ref Rng & Units 02/23/2024    2:32 PM 09/02/2023    8:40 AM 09/02/2023    5:14 AM  CBC  WBC 4.0 - 10.5 K/uL 7.0   6.1   Hemoglobin 13.0 - 17.0 g/dL 87.2  9.0  8.6   Hematocrit 39.0 - 52.0 % 36.3  26.9  25.6   Platelets  150 - 400 K/uL 157   123       Latest Ref Rng & Units 09/02/2023    5:14 AM 09/01/2023    4:47 AM 08/31/2023    6:11 AM  CMP  Glucose 70 - 99 mg/dL 853  853  818   BUN 8 - 23 mg/dL 23  26  24    Creatinine 0.61 - 1.24 mg/dL 8.15  7.90  8.10   Sodium 135 - 145 mmol/L 136  134  133   Potassium 3.5 - 5.1 mmol/L 3.5  3.6  3.8   Chloride 98 - 111 mmol/L 105  103  99   CO2 22 - 32 mmol/L 24  25  25    Calcium  8.9 - 10.3 mg/dL 7.8  7.9  8.4     DIAGNOSTIC IMAGING:  I have independently reviewed the scans and discussed with the patient. No results found.   ASSESSMENT:  1.  Prostate cancer: -Gleason score 5, status post radical prostatectomy in March 2006 by Dr. Javaid. -Status post radiation by Dr. Jason on 08/01/2010 through 09/14/2010   2.  CKD: -Followed by Dr. Rachele.   PLAN:  1.  Prostate cancer: - His PSA continues to be undetectable.   2.  Normocytic anemia: - Anemia from CKD.  - Ferritin is 269, hemoglobin 12.7, iron saturation 33% and B12 greater than thousand. - Patient can stop iron and B12 supplements at this time. - RTC 1 year for follow-up with repeat CBC, ferritin and iron panel.  Will also check B12 and folic acid  at that time.    Orders placed this encounter:  Orders  Placed This Encounter  Procedures   CBC with Differential/Platelet   Ferritin   Iron and TIBC   Vitamin B12   Folate   PSA    Delon Hope, NP 03/02/2024 2:27 PM

## 2024-03-04 DIAGNOSIS — D631 Anemia in chronic kidney disease: Secondary | ICD-10-CM | POA: Diagnosis not present

## 2024-03-04 DIAGNOSIS — Z7984 Long term (current) use of oral hypoglycemic drugs: Secondary | ICD-10-CM | POA: Diagnosis not present

## 2024-03-04 DIAGNOSIS — N183 Chronic kidney disease, stage 3 unspecified: Secondary | ICD-10-CM | POA: Diagnosis not present

## 2024-03-04 DIAGNOSIS — K219 Gastro-esophageal reflux disease without esophagitis: Secondary | ICD-10-CM | POA: Diagnosis not present

## 2024-03-04 DIAGNOSIS — I129 Hypertensive chronic kidney disease with stage 1 through stage 4 chronic kidney disease, or unspecified chronic kidney disease: Secondary | ICD-10-CM | POA: Diagnosis not present

## 2024-03-04 DIAGNOSIS — E1122 Type 2 diabetes mellitus with diabetic chronic kidney disease: Secondary | ICD-10-CM | POA: Diagnosis not present

## 2024-03-04 DIAGNOSIS — H401123 Primary open-angle glaucoma, left eye, severe stage: Secondary | ICD-10-CM | POA: Diagnosis not present

## 2024-03-05 DIAGNOSIS — H401133 Primary open-angle glaucoma, bilateral, severe stage: Secondary | ICD-10-CM | POA: Diagnosis not present

## 2024-03-08 DIAGNOSIS — R809 Proteinuria, unspecified: Secondary | ICD-10-CM | POA: Diagnosis not present

## 2024-03-08 DIAGNOSIS — N189 Chronic kidney disease, unspecified: Secondary | ICD-10-CM | POA: Diagnosis not present

## 2024-03-08 DIAGNOSIS — D631 Anemia in chronic kidney disease: Secondary | ICD-10-CM | POA: Diagnosis not present

## 2024-03-11 DIAGNOSIS — E1122 Type 2 diabetes mellitus with diabetic chronic kidney disease: Secondary | ICD-10-CM | POA: Diagnosis not present

## 2024-03-11 DIAGNOSIS — I129 Hypertensive chronic kidney disease with stage 1 through stage 4 chronic kidney disease, or unspecified chronic kidney disease: Secondary | ICD-10-CM | POA: Diagnosis not present

## 2024-03-11 DIAGNOSIS — N184 Chronic kidney disease, stage 4 (severe): Secondary | ICD-10-CM | POA: Diagnosis not present

## 2024-03-11 DIAGNOSIS — R809 Proteinuria, unspecified: Secondary | ICD-10-CM | POA: Diagnosis not present

## 2024-04-13 ENCOUNTER — Other Ambulatory Visit: Payer: Self-pay | Admitting: Family Medicine

## 2024-04-13 DIAGNOSIS — I10 Essential (primary) hypertension: Secondary | ICD-10-CM

## 2024-04-26 ENCOUNTER — Encounter: Payer: Self-pay | Admitting: *Deleted

## 2024-05-10 ENCOUNTER — Ambulatory Visit: Admitting: Nurse Practitioner

## 2024-05-10 ENCOUNTER — Encounter: Payer: Self-pay | Admitting: Nurse Practitioner

## 2024-05-10 VITALS — BP 120/62 | HR 60 | Ht 72.0 in | Wt 176.0 lb

## 2024-05-10 DIAGNOSIS — E1121 Type 2 diabetes mellitus with diabetic nephropathy: Secondary | ICD-10-CM

## 2024-05-10 DIAGNOSIS — I152 Hypertension secondary to endocrine disorders: Secondary | ICD-10-CM

## 2024-05-10 DIAGNOSIS — I10 Essential (primary) hypertension: Secondary | ICD-10-CM | POA: Diagnosis not present

## 2024-05-10 DIAGNOSIS — E1159 Type 2 diabetes mellitus with other circulatory complications: Secondary | ICD-10-CM | POA: Diagnosis not present

## 2024-05-10 DIAGNOSIS — Z7984 Long term (current) use of oral hypoglycemic drugs: Secondary | ICD-10-CM | POA: Diagnosis not present

## 2024-05-10 DIAGNOSIS — E782 Mixed hyperlipidemia: Secondary | ICD-10-CM

## 2024-05-10 LAB — POCT GLYCOSYLATED HEMOGLOBIN (HGB A1C): Hemoglobin A1C: 7 % — AB (ref 4.0–5.6)

## 2024-05-10 NOTE — Progress Notes (Signed)
 "                                                                           05/10/2024, 2:48 PM   Endocrinology follow-up note  Subjective:    Patient ID: Richard Swanson., male    DOB: 1984-10-18.  Richard Swanson. is being seen in follow-up after he was seen in consultation for management of currently uncontrolled symptomatic diabetes requested by  Kennyth Worth HERO, MD.   Past Medical History:  Diagnosis Date   Cancer [facility])    Prostate   Chronic obstructive pulmonary disease, unspecified (HCC)    Diabetes mellitus without complication (HCC)    Dizziness    Gastro-esophageal reflux disease without esophagitis    Glaucoma    Glomerular disorders in diseases classified elsewhere    Gross hematuria    Hypertensive chronic kidney disease with stage 1 through stage 4 chronic kidney disease, or unspecified chronic kidney disease    Kidney disease, chronic, stage III (moderate, EGFR 30-59 ml/min) (HCC)    Malignant neoplasm of prostate (HCC)    Prostate cancer (HCC) 11/03/2010   dx 2006   Type 2 diabetes mellitus with diabetic nephropathy (HCC)    Unspecified kidney failure     Past Surgical History:  Procedure Laterality Date   PROSTATE SURGERY      Social History   Socioeconomic History   Marital status: Married    Spouse name: Not on file   Number of children: Not on file   Years of education: Not on file   Highest education level: Not on file  Occupational History   Occupation: Retired  Tobacco Use   Smoking status: Never   Smokeless tobacco: Never  Vaping Use   Vaping status: Never Used  Substance and Sexual Activity   Alcohol use: No   Drug use: No   Sexual activity: Not on file  Other Topics Concern   Not on file  Social History Narrative   Not on file   Social Drivers of Health   Tobacco Use: Low Risk (05/10/2024)   Patient History    Smoking Tobacco Use: Never    Smokeless Tobacco Use: Never    Passive Exposure: Not on file  Financial Resource  Strain: Low Risk (05/12/2023)   Overall Financial Resource Strain (CARDIA)    Difficulty of Paying Living Expenses: Not hard at all  Food Insecurity: No Food Insecurity (03/04/2024)   Received from [facility]   Epic    Within the past 12 months, you worried that your food would run out before you got the money to buy more.: Never true    Within the past 12 months, the food you bought just didn't last and you didn't have money to get more.: Never true  Transportation Needs: No Transportation Needs (03/04/2024)   Received [facility]    In the past 12 months, has lack of transportation kept you from medical appointments or from getting medications?: No    In the past 12 months, has lack of transportation kept you from meetings, work, or from getting things needed for daily living?: No  Physical Activity: Sufficiently Active (05/12/2023)   Exercise Vital Sign  Days of Exercise per Week: 3 days    Minutes of Exercise per Session: 60 min  Stress: No Stress Concern Present (05/12/2023)   [employer] of Occupational [employer] - Occupational Stress Questionnaire    Feeling of Stress : Not at all  Social Connections: Socially Integrated (08/31/2023)   Social Connection and Isolation Panel    Frequency of Communication with Friends and Family: Three times a week    Frequency of Social Gatherings with Friends and Family: Three times a week    Attends Religious Services: More than 4 times per year    Active Member of Clubs or Organizations: Yes    Attends Banker Meetings: 1 to 4 times per year    Marital Status: Married  Depression (PHQ2-9): Low Risk (03/02/2024)   Depression (PHQ2-9)    PHQ-2 Score: 0  Alcohol Screen: Not on file  Housing: Unknown (03/04/2024)   [facility]    In the last 12 months, was there a time when you were not able to pay the mortgage or rent on time?: No    Number of Times Moved in the Last Year: Not on file    At  any time in the past 12 months, were you homeless or living in a shelter (including now)?: No  Utilities: Not At Risk (03/04/2024)   Received from [facility]    In the past 12 months has the electric, gas, oil, or water company threatened to shut off services in your home?: No  Health Literacy: Adequate Health Literacy (05/12/2023)   B1300 Health Literacy    Frequency of need for help with medical instructions: Never    Family History  Problem Relation Age of Onset   Stroke Father     Outpatient Encounter Medications as of 05/10/2024  Medication Sig   acetaZOLAMIDE (DIAMOX) 250 MG tablet Take 250 mg by mouth.   amLODipine  (NORVASC ) 10 MG tablet Take 1 tablet (10 mg total) by mouth daily.   atropine 1 % ophthalmic solution 1 drop.   brimonidine  (ALPHAGAN ) 0.2 % ophthalmic solution Place 1 drop into both eyes daily.   docusate sodium  (COLACE) 100 MG capsule Take 1 capsule (100 mg total) by mouth 2 (two) times daily.   dorzolamide-timolol (COSOPT) 2-0.5 % ophthalmic solution Apply 1 drop to eye.   ferrous sulfate  325 (65 FE) MG EC tablet Take 1 tablet (325 mg total) by mouth 2 (two) times daily.   glipiZIDE  (GLUCOTROL  XL) 5 MG 24 hr tablet Take 1 tablet (5 mg total) by mouth daily with breakfast.   latanoprost  (XALATAN ) 0.005 % ophthalmic solution Place 1 drop into both eyes at bedtime.   Latanoprostene Bunod (VYZULTA) 0.024 % SOLN Apply 1 drop to eye.   metoprolol  succinate (TOPROL -XL) 25 MG 24 hr tablet TAKE ONE TABLET BY MOUTH EVERY DAY   moxifloxacin (VIGAMOX) 0.5 % ophthalmic solution Insert one drop 3 times a day into right eye starting 4 days prior to surgery   rosuvastatin  (CRESTOR ) 5 MG tablet Take 1 tablet (5 mg total) by mouth daily.   sitaGLIPtin  (JANUVIA ) 50 MG tablet Take 1 tablet (50 mg total) by mouth daily.   solifenacin  (VESICARE ) 10 MG tablet TAKE (1) TABLET BY MOUTH ONCE DAILY.   traMADol  (ULTRAM ) 50 MG tablet Take 0.5-1 tablets (25-50 mg total) by mouth every  12 (twelve) hours as needed for moderate pain (pain score 4-6).   valsartan (DIOVAN) 40 MG tablet Take 40  mg by mouth daily.   No facility-administered encounter medications on file as of 05/10/2024.    ALLERGIES: No Known Allergies  VACCINATION STATUS: Immunization History  Administered Date(s) Administered   Fluad Quad(high Dose 65+) 02/14/2020, 02/20/2021, 02/25/2022   Fluad Trivalent(High Dose 65+) 02/24/2023   INFLUENZA, HIGH DOSE SEASONAL PF 03/23/2018, 03/02/2024   Influenza,inj,Quad PF,6+ Mos 01/30/2016, 01/29/2017, 01/28/2019   Influenza-Unspecified 12/28/2020   Moderna SARS-COV2 Booster Vaccination 03/16/2020   Moderna Sars-Covid-2 Vaccination 06/05/2019, 07/06/2019   Pneumococcal Conjugate-13 04/06/2020   Pneumococcal Polysaccharide-23 01/28/2019    Diabetes He presents for his follow-up diabetic visit. He has type 2 diabetes mellitus. Onset time: He was diagnosed at approximate age of 70 years. His disease course has been stable. There are no hypoglycemic associated symptoms. Pertinent negatives for hypoglycemia include no confusion, pallor or seizures. Pertinent negatives for diabetes include no fatigue, no polydipsia, no polyphagia, no polyuria, no weakness and no weight loss. There are no hypoglycemic complications. Symptoms are stable. Diabetic complications include nephropathy and peripheral neuropathy. Risk factors for coronary artery disease include dyslipidemia, diabetes mellitus, hypertension, male sex and sedentary lifestyle. Current diabetic treatment includes oral agent (dual therapy). He is compliant with treatment most of the time. His weight is fluctuating minimally. He is following a generally healthy diet. When asked about meal planning, he reported none. He has not had a previous visit with a dietitian. He rarely participates in exercise. His home blood glucose trend is fluctuating minimally. His overall blood glucose range is 130-140 mg/dl. (He presents today  with his logs showing mostly at goal glycemic profile.  His POCT A1c today is 7%, increasing from last visit of 6.3%.  He denies any hypoglycemia or symptoms of such.  Analysis of his meter shows 7-day average of 139, 14-day average of 135, 30-day average of 139.  ) An ACE inhibitor/angiotensin II receptor blocker is being taken. He does not see a podiatrist.Eye exam is current.     Review of systems  Constitutional: + fluctuating body weight,  current Body mass index is 23.87 kg/m. , no fatigue, no subjective hyperthermia, no subjective hypothermia Eyes: no blurry vision, no xerophthalmia- had surgery for glaucoma since last visit-healing well ENT: no sore throat, no nodules palpated in throat, no dysphagia/odynophagia, no hoarseness Cardiovascular: no chest pain, no shortness of breath, no palpitations, no leg swelling Respiratory: no cough, no shortness of breath Gastrointestinal: no nausea/vomiting/diarrhea Musculoskeletal: no muscle/joint aches Skin: no rashes, no hyperemia Neurological: no tremors, no numbness, no tingling, no dizziness Psychiatric: no depression, no anxiety   Objective:    BP 120/62 (BP Location: Left Arm, Patient Position: Sitting, Cuff Size: Large)   Pulse 60   Ht 6' (1.829 m)   Wt 176 lb (79.8 kg)   BMI 23.87 kg/m   Wt Readings from Last 3 Encounters:  05/10/24 176 lb (79.8 kg)  03/02/24 169 lb (76.7 kg)  01/06/24 174 lb 9.6 oz (79.2 kg)    BP Readings from Last 3 Encounters:  05/10/24 120/62  03/02/24 (!) 147/53  01/06/24 138/72      Physical Exam- Limited  Constitutional:  Body mass index is 23.87 kg/m. , not in acute distress, normal state of mind Eyes:  EOMI, no exophthalmos Musculoskeletal: no gross deformities, strength intact in all four extremities, no gross restriction of joint movements Skin:  no rashes, no hyperemia Neurological: no tremor with outstretched hands   Diabetic Foot Exam - Simple   No data filed     CMP  Component Value Date/Time   NA 136 09/02/2023 0514   NA 140 05/14/2019 0000   K 3.5 09/02/2023 0514   CL 105 09/02/2023 0514   CO2 24 09/02/2023 0514   GLUCOSE 146 (H) 09/02/2023 0514   BUN 23 09/02/2023 0514   BUN 26 (A) 05/14/2019 0000   CREATININE 1.84 (H) 09/02/2023 0514   CREATININE 2.23 (H) 04/06/2020 1206   CALCIUM  7.8 (L) 09/02/2023 0514   PROT 8.2 (H) 08/30/2023 1800   ALBUMIN 3.9 08/30/2023 1800   AST 32 08/30/2023 1800   ALT 30 08/30/2023 1800   ALKPHOS 58 08/30/2023 1800   BILITOT 1.3 (H) 08/30/2023 1800   GFRNONAA 35 (L) 09/02/2023 0514   GFRAA 29 (L) 07/29/2019 1115    Diabetic Labs (most recent): Lab Results  Component Value Date   HGBA1C 7.0 (A) 05/10/2024   HGBA1C 5.8 (A) 08/28/2023   HGBA1C 6.8 (A) 01/22/2023     Lipid Panel ( most recent) Lipid Panel     Component Value Date/Time   CHOL 120 11/27/2022 0753   TRIG 74.0 11/27/2022 0753   HDL 46.10 11/27/2022 0753   CHOLHDL 3 11/27/2022 0753   VLDL 14.8 11/27/2022 0753   LDLCALC 59 11/27/2022 0753   LDLCALC 59 04/06/2020 1206      Lab Results  Component Value Date   TSH 1.65 11/27/2022   TSH 1.09 05/03/2021   TSH 0.49 04/06/2020   TSH 1.48 05/05/2017      Assessment & Plan:   1) Type 2 diabetes mellitus with diabetic nephropathy, without long-term current use of insulin  (HCC)  - Aydenn Friday Jr. has currently uncontrolled symptomatic type 2 DM since 89 years of age.  He presents today with his logs showing mostly at goal glycemic profile.  His POCT A1c today is 7%, increasing from last visit of 6.3%.  He denies any hypoglycemia or symptoms of such.  Analysis of his meter shows 7-day average of 139, 14-day average of 135, 30-day average of 139.    - Recent labs reviewed.  - I had a long discussion with him about the progressive nature of diabetes and the pathology behind its complications. -his diabetes is complicated by CKD and he remains at a high risk for more acute and chronic  complications which include CAD, CVA, CKD, retinopathy, and neuropathy. These are all discussed in detail with him.  - Nutritional counseling repeated/built upon at each appointment.  - The patient admits there is a room for improvement in their diet and drink choices. -  Suggestion is made for the patient to avoid simple carbohydrates from their diet including Cakes, Sweet Desserts / Pastries, Ice Cream, Soda (diet and regular), Sweet Tea, Candies, Chips, Cookies, Sweet Pastries, Store Bought Juices, Alcohol in Excess of 1-2 drinks a day, Artificial Sweeteners, Coffee Creamer, and Sugar-free Products. This will help patient to have stable blood glucose profile and potentially avoid unintended weight gain.   - I encouraged the patient to switch to unprocessed or minimally processed complex starch and increased protein intake (animal or plant source), fruits, and vegetables.   - Patient is advised to stick to a routine mealtimes to eat 3 meals a day and avoid unnecessary snacks (to snack only to correct hypoglycemia).  - I have approached him with the following individualized plan to manage  his diabetes and patient agrees:   -Due to stable glycemic profile and lack of hypoglycemia, no changes will be made to his medications at this time.  He is  advised to continue Januvia  50 mg po daily and Glipizide  5 mg XL daily with breakfast.    -He is encouraged to continue monitoring blood glucose once daily, before breakfast and to call the clinic if he has readings less than 70 or greater than 200 for 3 tests in a row.  - he is not a candidate for metformin, SGLT2 inhibitors, or full dose Januvia  due to concurrent renal insufficiency.  - Specific targets for  A1c;  LDL, HDL,  and Triglycerides were discussed with the patient.  2) Blood Pressure /Hypertension:  His blood pressure is controlled to target for his age. He is advised to continue meds as prescribed by PCP/nephrology.  3)  Lipids/Hyperlipidemia:   His most recent lipid panel from 11/27/22 shows controlled LDL of 59.  He is advised to continue Crestor  5 mg po daily at bedtime.  Side effects and precautions discussed with him.   4)  Weight/Diet: His Body mass index is 23.87 kg/m.  -  he is not a candidate for weight loss.  Exercise, and detailed carbohydrates information provided  -  detailed on discharge instructions.  5) Chronic Care/Health Maintenance: -he  is on ACEI/ARB and Statin medications and is encouraged to initiate and continue to follow up with Ophthalmology, Dentist, Nephrologist, and Podiatrist at least yearly or according to recommendations, and advised to stay away from smoking. I have recommended yearly flu vaccine and pneumonia vaccine at least every 5 years; moderate intensity exercise for up to 150 minutes weekly; and  sleep for at least 7 hours a day.  - he is  advised to maintain close follow up with Kennyth Worth HERO, MD for primary care needs, as well as his other providers for optimal and coordinated care.       I spent  23  minutes in the care of the patient today including review of labs from CMP, Lipids, Thyroid  Function, Hematology (current and previous including abstractions from other facilities); face-to-face time discussing  his blood glucose readings/logs, discussing hypoglycemia and hyperglycemia episodes and symptoms, medications doses, his options of short and long term treatment based on the latest standards of care / guidelines;  discussion about incorporating lifestyle medicine;  and documenting the encounter. Risk reduction counseling performed per USPSTF guidelines to reduce obesity and cardiovascular risk factors.     Please refer to Patient Instructions for Blood Glucose Monitoring and Insulin /Medications Dosing Guide  in media tab for additional information. Please  also refer to  Patient Self Inventory in the Media  tab for reviewed elements of pertinent patient  history.  Richard Swanson. participated in the discussions, expressed understanding, and voiced agreement with the above plans.  All questions were answered to his satisfaction. he is encouraged to contact clinic should he have any questions or concerns prior to his return visit.   Follow up plan: - Return in about 4 months (around 09/07/2024) for Diabetes F/U with A1c in office, No previsit labs, Bring meter and logs.  Benton Rio, [facility] [address] [place], [place] [address] Phone: [phone] Fax: [phone]   05/10/2024, 2:48 PM    "

## 2024-05-13 ENCOUNTER — Ambulatory Visit: Payer: Medicare HMO

## 2024-05-13 VITALS — BP 138/62 | HR 60 | Temp 97.7°F | Ht 72.0 in | Wt 175.6 lb

## 2024-05-13 DIAGNOSIS — Z0000 Encounter for general adult medical examination without abnormal findings: Secondary | ICD-10-CM | POA: Diagnosis not present

## 2024-05-13 NOTE — Patient Instructions (Signed)
 Mr. Utter,  Thank you for taking the time for your Medicare Wellness Visit. I appreciate your continued commitment to your health goals. Please review the care plan we discussed, and feel free to reach out if I can assist you further.  Please note that Annual Wellness Visits do not include a physical exam. Some assessments may be limited, especially if the visit was conducted virtually. If needed, we may recommend an in-person follow-up with your provider.  Ongoing Care Seeing your primary care provider every 3 to 6 months helps us  monitor your health and provide consistent, personalized care.   Referrals If a referral was made during today's visit and you haven't received any updates within two weeks, please contact the referred provider directly to check on the status.  Recommended Screenings:  Health Maintenance  Topic Date Due   Zoster (Shingles) Vaccine (1 of 2) Never done   Medicare Annual Wellness Visit  05/11/2024   Hemoglobin A1C  11/07/2024   Complete foot exam   01/05/2025   Eye exam for diabetics  02/16/2025   Pneumococcal Vaccine for age over 65  Completed   Flu Shot  Completed   Meningitis B Vaccine  Aged Out   DTaP/Tdap/Td vaccine  Discontinued   COVID-19 Vaccine  Discontinued       03/02/2024    2:14 PM  Advanced Directives  Does Patient Have a Medical Advance Directive? No  Would patient like information on creating a medical advance directive? No - Patient declined    Vision: Annual vision screenings are recommended for early detection of glaucoma, cataracts, and diabetic retinopathy. These exams can also reveal signs of chronic conditions such as diabetes and high blood pressure.  Dental: Annual dental screenings help detect early signs of oral cancer, gum disease, and other conditions linked to overall health, including heart disease and diabetes.  Please see the attached documents for additional preventive care recommendations.

## 2024-05-13 NOTE — Progress Notes (Signed)
 Maintain health and a  Chief Complaint  Patient presents with   Medicare Wellness     Subjective:   Richard Swanson. is a 89 y.o. male who presents for a Medicare Annual Wellness Visit.  Visit info / Clinical Intake: Medicare Wellness Visit Type:: Subsequent Annual Wellness Visit Persons participating in visit and providing information:: patient (wife) Medicare Wellness Visit Mode:: In-person (required for WTM) Interpreter Needed?: No Pre-visit prep was completed: yes AWV questionnaire completed by patient prior to visit?: no Living arrangements:: lives with spouse/significant other Patient's Overall Health Status Rating: good Typical amount of pain: some Does pain affect daily life?: no Are you currently prescribed opioids?: (!) yes  Dietary Habits and Nutritional Risks How many meals a day?: 2 Eats fruit and vegetables daily?: yes Most meals are obtained by: preparing own meals; eating out In the last 2 weeks, have you had any of the following?: none Diabetic:: (!) yes Any non-healing wounds?: no How often do you check your BS?: 1 Would you like to be referred to a Nutritionist or for Diabetic Management? : no  Functional Status Activities of Daily Living (to include ambulation/medication): Independent Ambulation: Independent with device- listed below Home Assistive Devices/Equipment: Eyeglasses Medication Administration: Independent Home Management (perform basic housework or laundry): Independent Manage your own finances?: yes Primary transportation is: driving Concerns about vision?: no *vision screening is required for WTM* Concerns about hearing?: (!) yes (left ear tinnitus) Uses hearing aids?: no Hear whispered voice?: yes  Fall Screening Falls in the past year?: 0 Number of falls in past year: 0 Was there an injury with Fall?: 0 Fall Risk Category Calculator: 0 Patient Fall Risk Level: Low Fall Risk  Fall Risk Patient at Risk for Falls Due to: No  Fall Risks Fall risk Follow up: Falls evaluation completed  Home and Transportation Safety: All rugs have non-skid backing?: yes All stairs or steps have railings?: N/A, no stairs Grab bars in the bathtub or shower?: yes Have non-skid surface in bathtub or shower?: yes Good home lighting?: yes Regular seat belt use?: yes Hospital stays in the last year:: (!) no; yes How many hospital stays:: 1 Reason: UTI  Cognitive Assessment Difficulty concentrating, remembering, or making decisions? : no Will 6CIT or Mini Cog be Completed: yes What year is it?: 0 points What month is it?: 0 points Give patient an address phrase to remember (5 components): [address] [place]  About what time is it?: 0 points Count backwards from 20 to 1: 0 points Say the months of the year in reverse: 0 points Repeat the address phrase from earlier: 0 points 6 CIT Score: 0 points  Advance Directives (For Healthcare) Does Patient Have a Medical Advance Directive?: No Would patient like information on creating a medical advance directive?: Yes (MAU/Ambulatory/Procedural Areas - Information given)  Reviewed/Updated  Reviewed/Updated: Reviewed All (Medical, Surgical, Family, Medications, Allergies, Care Teams, Patient Goals)    Allergies (verified) Patient has no known allergies.   Current Medications (verified) Outpatient Encounter Medications as of 05/13/2024  Medication Sig   amLODipine  (NORVASC ) 10 MG tablet Take 1 tablet (10 mg total) by mouth daily.   atropine 1 % ophthalmic solution 1 drop.   atropine 1 % ophthalmic solution 1 drop.   brimonidine  (ALPHAGAN ) 0.2 % ophthalmic solution Place 1 drop into both eyes daily.   docusate sodium  (COLACE) 100 MG capsule Take 1 capsule (100 mg total) by mouth 2 (two) times daily.   ferrous sulfate  325 (65 FE) MG EC  tablet Take 1 tablet (325 mg total) by mouth 2 (two) times daily.   glipiZIDE  (GLUCOTROL  XL) 5 MG 24 hr tablet Take 1 tablet (5 mg total) by mouth  daily with breakfast.   metoprolol  succinate (TOPROL -XL) 25 MG 24 hr tablet TAKE ONE TABLET BY MOUTH EVERY DAY   moxifloxacin (VIGAMOX) 0.5 % ophthalmic solution Insert one drop 3 times a day into right eye starting 4 days prior to surgery   rosuvastatin  (CRESTOR ) 5 MG tablet Take 1 tablet (5 mg total) by mouth daily.   sitaGLIPtin  (JANUVIA ) 50 MG tablet Take 1 tablet (50 mg total) by mouth daily.   solifenacin  (VESICARE ) 10 MG tablet TAKE (1) TABLET BY MOUTH ONCE DAILY.   traMADol  (ULTRAM ) 50 MG tablet Take 0.5-1 tablets (25-50 mg total) by mouth every 12 (twelve) hours as needed for moderate pain (pain score 4-6).   valsartan (DIOVAN) 40 MG tablet Take 40 mg by mouth daily.   moxifloxacin (VIGAMOX) 0.5 % ophthalmic solution 1 drop.   neomycin-polymyxin b-dexamethasone (MAXITROL) 3.5-10000-0.1 OINT Apply 1 Application to eye.   prednisoLONE acetate (PRED FORTE) 1 % ophthalmic suspension 1 drop 4xdaily x1wk,then 3xdaily x1wk,then 2xdaily x1wk,then once daily x1wk,then stop right eye.1 drop 4xdaily in left eye   [DISCONTINUED] acetaZOLAMIDE (DIAMOX) 250 MG tablet Take 250 mg by mouth. (Patient not taking: Reported on 05/13/2024)   [DISCONTINUED] dorzolamide-timolol (COSOPT) 2-0.5 % ophthalmic solution Apply 1 drop to eye.   [DISCONTINUED] latanoprost  (XALATAN ) 0.005 % ophthalmic solution Place 1 drop into both eyes at bedtime.   [DISCONTINUED] Latanoprostene Bunod (VYZULTA) 0.024 % SOLN Apply 1 drop to eye.   No facility-administered encounter medications on file as of 05/13/2024.    History: Past Medical History:  Diagnosis Date   Cancer (HCC)    Prostate   Chronic obstructive pulmonary disease, unspecified (HCC)    Diabetes mellitus without complication (HCC)    Dizziness    Gastro-esophageal reflux disease without esophagitis    Glaucoma    Glomerular disorders in diseases classified elsewhere    Gross hematuria    Hypertension 11/08/2010   Hypertensive chronic kidney disease with  stage 1 through stage 4 chronic kidney disease, or unspecified chronic kidney disease    Kidney disease, chronic, stage III (moderate, EGFR 30-59 ml/min) (HCC)    Malignant neoplasm of prostate (HCC)    Prostate cancer (HCC) 11/03/2010   dx 2006   Type 2 diabetes mellitus with diabetic nephropathy (HCC)    Unspecified kidney failure    Past Surgical History:  Procedure Laterality Date   EYE SURGERY     PROSTATE SURGERY     Family History  Problem Relation Age of Onset   Stroke Father    Social History   Occupational History   Occupation: Retired  Tobacco Use   Smoking status: Never   Smokeless tobacco: Never  Vaping Use   Vaping status: Never Used  Substance and Sexual Activity   Alcohol use: No   Drug use: No   Sexual activity: Not on file   Tobacco Counseling Counseling given: Not Answered  SDOH Screenings   Food Insecurity: No Food Insecurity (05/13/2024)  Housing: Low Risk (05/13/2024)  Transportation Needs: No Transportation Needs (05/13/2024)  Utilities: Not At Risk (05/13/2024)  Depression (PHQ2-9): Low Risk (05/13/2024)  Financial Resource Strain: Low Risk (05/12/2023)  Physical Activity: Sufficiently Active (05/13/2024)  Social Connections: Socially Integrated (05/13/2024)  Stress: No Stress Concern Present (05/13/2024)  Tobacco Use: Low Risk (05/13/2024)  Health Literacy: Adequate Health Literacy (  05/13/2024)   See flowsheets for full screening details  Depression Screen PHQ 2 & 9 Depression Scale- Over the past 2 weeks, how often have you been bothered by any of the following problems? Little interest or pleasure in doing things: 0 Feeling down, depressed, or hopeless (PHQ Adolescent also includes...irritable): 0 PHQ-2 Total Score: 0     Goals Addressed               This Visit's Progress     maintain health and activity (pt-stated)        Maintain health and activity              Objective:    Today's Vitals   05/13/24 1326  BP: 138/62   Pulse: 60  Temp: 97.7 F (36.5 C)  SpO2: 99%  Weight: 175 lb 9.6 oz (79.7 kg)  Height: 6' (1.829 m)   Body mass index is 23.82 kg/m.  Hearing/Vision screen Hearing Screening - Comments:: Left ear Tinnitus no hearing aids  Vision Screening - Comments:: Wears rx glasses - up to date with routine eye exams with Dr Marcey Ebbing  Immunizations and Health Maintenance Health Maintenance  Topic Date Due   Zoster Vaccines- Shingrix (1 of 2) Never done   HEMOGLOBIN A1C  11/07/2024   FOOT EXAM  01/05/2025   OPHTHALMOLOGY EXAM  02/16/2025   Medicare Annual Wellness (AWV)  05/13/2025   Pneumococcal Vaccine: 50+ Years  Completed   Influenza Vaccine  Completed   Meningococcal B Vaccine  Aged Out   DTaP/Tdap/Td  Discontinued   COVID-19 Vaccine  Discontinued        Assessment/Plan:  This is a routine wellness examination for [place].  Patient Care Team: Kennyth Worth HERO, MD as PCP - General (Family Medicine) Charls Pearla LABOR, MD (Inactive) as PCP - Cardiology (Cardiology) Rachele Gaynell RAMAN, MD as Consulting Physician (Nephrology)  I have personally reviewed and noted the following in the patients chart:   Medical and social history Use of alcohol, tobacco or illicit drugs  Current medications and supplements including opioid prescriptions. Functional ability and status Nutritional status Physical activity Advanced directives List of other physicians Hospitalizations, surgeries, and ER visits in previous 12 months Vitals Screenings to include cognitive, depression, and falls Referrals and appointments  No orders of the defined types were placed in this encounter.  In addition, I have reviewed and discussed with patient certain preventive protocols, quality metrics, and best practice recommendations. A written personalized care plan for preventive services as well as general preventive health recommendations were provided to patient.   Ellouise VEAR Haws, LPN   8/84/7973    Return in about 1 year (around 05/16/2025).  After Visit Summary: (In Person-Printed) AVS printed and given to the patient  Nurse Notes: No voiced or noted concerns at this time, vaccines shingrix discussed

## 2024-09-08 ENCOUNTER — Ambulatory Visit: Admitting: Nurse Practitioner

## 2025-03-02 ENCOUNTER — Inpatient Hospital Stay

## 2025-03-09 ENCOUNTER — Inpatient Hospital Stay: Admitting: Oncology

## 2025-05-19 ENCOUNTER — Ambulatory Visit
